# Patient Record
Sex: Male | Born: 1960 | Race: White | Hispanic: No | Smoking: Current every day smoker
Health system: Southern US, Community
[De-identification: ages and names within clinical notes are randomized; demographics above are authoritative.]

## PROBLEM LIST (undated history)

## (undated) DIAGNOSIS — F4310 Post-traumatic stress disorder, unspecified: Secondary | ICD-10-CM

## (undated) DIAGNOSIS — T7840XA Allergy, unspecified, initial encounter: Secondary | ICD-10-CM

## (undated) DIAGNOSIS — K219 Gastro-esophageal reflux disease without esophagitis: Secondary | ICD-10-CM

## (undated) DIAGNOSIS — E7800 Pure hypercholesterolemia, unspecified: Secondary | ICD-10-CM

## (undated) DIAGNOSIS — G709 Myoneural disorder, unspecified: Secondary | ICD-10-CM

## (undated) DIAGNOSIS — F32A Depression, unspecified: Secondary | ICD-10-CM

## (undated) DIAGNOSIS — M1990 Unspecified osteoarthritis, unspecified site: Secondary | ICD-10-CM

## (undated) DIAGNOSIS — F319 Bipolar disorder, unspecified: Secondary | ICD-10-CM

## (undated) DIAGNOSIS — K635 Polyp of colon: Secondary | ICD-10-CM

## (undated) DIAGNOSIS — K649 Unspecified hemorrhoids: Secondary | ICD-10-CM

## (undated) DIAGNOSIS — G4730 Sleep apnea, unspecified: Secondary | ICD-10-CM

## (undated) DIAGNOSIS — J449 Chronic obstructive pulmonary disease, unspecified: Secondary | ICD-10-CM

## (undated) DIAGNOSIS — E119 Type 2 diabetes mellitus without complications: Secondary | ICD-10-CM

## (undated) DIAGNOSIS — E782 Mixed hyperlipidemia: Secondary | ICD-10-CM

## (undated) HISTORY — DX: Depression, unspecified: F32.A

## (undated) HISTORY — DX: Chronic obstructive pulmonary disease, unspecified: J44.9

## (undated) HISTORY — PX: CATARACT EXTRACTION W/ INTRAOCULAR LENS IMPLANT: SHX1309

## (undated) HISTORY — DX: Sleep apnea, unspecified: G47.30

## (undated) HISTORY — DX: Allergy, unspecified, initial encounter: T78.40XA

## (undated) HISTORY — DX: Unspecified osteoarthritis, unspecified site: M19.90

## (undated) HISTORY — DX: Myoneural disorder, unspecified: G70.9

## (undated) HISTORY — PX: WRIST SURGERY: SHX841

---

## 2014-01-20 DIAGNOSIS — F4310 Post-traumatic stress disorder, unspecified: Secondary | ICD-10-CM | POA: Insufficient documentation

## 2014-06-15 DIAGNOSIS — J9809 Other diseases of bronchus, not elsewhere classified: Secondary | ICD-10-CM | POA: Insufficient documentation

## 2014-06-15 DIAGNOSIS — H25011 Cortical age-related cataract, right eye: Secondary | ICD-10-CM | POA: Insufficient documentation

## 2014-06-29 DIAGNOSIS — Z720 Tobacco use: Secondary | ICD-10-CM | POA: Insufficient documentation

## 2014-07-20 DIAGNOSIS — F3132 Bipolar disorder, current episode depressed, moderate: Secondary | ICD-10-CM | POA: Insufficient documentation

## 2015-03-25 DIAGNOSIS — M25831 Other specified joint disorders, right wrist: Secondary | ICD-10-CM | POA: Insufficient documentation

## 2015-03-25 DIAGNOSIS — S62101A Fracture of unspecified carpal bone, right wrist, initial encounter for closed fracture: Secondary | ICD-10-CM

## 2015-03-25 DIAGNOSIS — S62009A Unspecified fracture of navicular [scaphoid] bone of unspecified wrist, initial encounter for closed fracture: Secondary | ICD-10-CM | POA: Insufficient documentation

## 2015-03-25 HISTORY — DX: Fracture of unspecified carpal bone, right wrist, initial encounter for closed fracture: S62.101A

## 2015-04-11 HISTORY — PX: WRIST FRACTURE SURGERY: SHX121

## 2015-10-23 DIAGNOSIS — H2511 Age-related nuclear cataract, right eye: Secondary | ICD-10-CM | POA: Insufficient documentation

## 2015-10-24 DIAGNOSIS — Z961 Presence of intraocular lens: Secondary | ICD-10-CM | POA: Insufficient documentation

## 2015-10-24 DIAGNOSIS — H179 Unspecified corneal scar and opacity: Secondary | ICD-10-CM | POA: Insufficient documentation

## 2015-12-18 DIAGNOSIS — G250 Essential tremor: Secondary | ICD-10-CM | POA: Insufficient documentation

## 2016-04-29 DIAGNOSIS — M1811 Unilateral primary osteoarthritis of first carpometacarpal joint, right hand: Secondary | ICD-10-CM | POA: Insufficient documentation

## 2016-06-26 DIAGNOSIS — E669 Obesity, unspecified: Secondary | ICD-10-CM | POA: Insufficient documentation

## 2016-12-11 DIAGNOSIS — G5621 Lesion of ulnar nerve, right upper limb: Secondary | ICD-10-CM | POA: Insufficient documentation

## 2017-01-18 DIAGNOSIS — E119 Type 2 diabetes mellitus without complications: Secondary | ICD-10-CM | POA: Insufficient documentation

## 2017-01-18 DIAGNOSIS — E782 Mixed hyperlipidemia: Secondary | ICD-10-CM | POA: Insufficient documentation

## 2019-08-07 DIAGNOSIS — Z9049 Acquired absence of other specified parts of digestive tract: Secondary | ICD-10-CM | POA: Insufficient documentation

## 2019-08-24 ENCOUNTER — Other Ambulatory Visit: Payer: Self-pay

## 2019-08-24 ENCOUNTER — Emergency Department: Payer: Medicaid Other | Admitting: Anesthesiology

## 2019-08-24 ENCOUNTER — Encounter
Admission: EM | Disposition: A | Discharge status: Home / Self Care | Payer: Self-pay | Source: Home / Self Care | Attending: Emergency Medicine

## 2019-08-24 ENCOUNTER — Emergency Department: Payer: Medicaid Other

## 2019-08-24 ENCOUNTER — Encounter: Payer: Self-pay | Admitting: Emergency Medicine

## 2019-08-24 ENCOUNTER — Observation Stay
Admission: EM | Admit: 2019-08-24 | Discharge: 2019-08-25 | Disposition: A | Discharge status: Home / Self Care | Payer: Medicaid Other | Attending: Surgery | Admitting: Surgery

## 2019-08-24 DIAGNOSIS — K3580 Unspecified acute appendicitis: Secondary | ICD-10-CM | POA: Diagnosis present

## 2019-08-24 DIAGNOSIS — Z7982 Long term (current) use of aspirin: Secondary | ICD-10-CM | POA: Insufficient documentation

## 2019-08-24 DIAGNOSIS — R1030 Lower abdominal pain, unspecified: Secondary | ICD-10-CM | POA: Diagnosis present

## 2019-08-24 DIAGNOSIS — R1031 Right lower quadrant pain: Secondary | ICD-10-CM

## 2019-08-24 DIAGNOSIS — F17210 Nicotine dependence, cigarettes, uncomplicated: Secondary | ICD-10-CM | POA: Insufficient documentation

## 2019-08-24 DIAGNOSIS — Z20822 Contact with and (suspected) exposure to covid-19: Secondary | ICD-10-CM | POA: Insufficient documentation

## 2019-08-24 DIAGNOSIS — R110 Nausea: Secondary | ICD-10-CM

## 2019-08-24 DIAGNOSIS — K3532 Acute appendicitis with perforation and localized peritonitis, without abscess: Secondary | ICD-10-CM | POA: Diagnosis not present

## 2019-08-24 HISTORY — PX: XI ROBOTIC LAPAROSCOPIC ASSISTED APPENDECTOMY: SHX6877

## 2019-08-24 HISTORY — DX: Post-traumatic stress disorder, unspecified: F43.10

## 2019-08-24 HISTORY — DX: Unspecified acute appendicitis: K35.80

## 2019-08-24 LAB — COMPREHENSIVE METABOLIC PANEL
ALT: 26 U/L (ref 0–44)
AST: 20 U/L (ref 15–41)
Albumin: 4.4 g/dL (ref 3.5–5.0)
Alkaline Phosphatase: 76 U/L (ref 38–126)
Anion gap: 9 (ref 5–15)
BUN: 8 mg/dL (ref 8–23)
CO2: 26 mmol/L (ref 22–32)
Calcium: 9.2 mg/dL (ref 8.9–10.3)
Chloride: 103 mmol/L (ref 98–111)
Creatinine, Ser: 0.79 mg/dL (ref 0.61–1.24)
GFR calc Af Amer: >60 mL/min (ref >60)
GFR calc non Af Amer: >60 mL/min (ref >60)
Glucose, Bld: 151 mg/dL — ABNORMAL HIGH (ref 70–99)
Potassium: 4 mmol/L (ref 3.5–5.1)
Sodium: 138 mmol/L (ref 135–145)
Total Bilirubin: 1 mg/dL (ref 0.3–1.2)
Total Protein: 7.6 g/dL (ref 6.5–8.1)

## 2019-08-24 LAB — SARS CORONAVIRUS 2 BY RT PCR (HOSPITAL ORDER, PERFORMED IN ~~LOC~~ HOSPITAL LAB): SARS Coronavirus 2: NEGATIVE

## 2019-08-24 LAB — URINALYSIS, COMPLETE (UACMP) WITH MICROSCOPIC
Bacteria, UA: NONE SEEN
Bilirubin Urine: NEGATIVE
Glucose, UA: NEGATIVE mg/dL
Hgb urine dipstick: NEGATIVE
Ketones, ur: 5 mg/dL — AB
Leukocytes,Ua: NEGATIVE
Nitrite: NEGATIVE
Protein, ur: NEGATIVE mg/dL
Specific Gravity, Urine: 1.029 (ref 1.005–1.030)
Squamous Epithelial / HPF: NONE SEEN (ref 0–5)
pH: 6 (ref 5.0–8.0)

## 2019-08-24 LAB — LIPASE, BLOOD: Lipase: 22 U/L (ref 11–51)

## 2019-08-24 LAB — CBC
HCT: 46.6 % (ref 39.0–52.0)
Hemoglobin: 15.8 g/dL (ref 13.0–17.0)
MCH: 30.4 pg (ref 26.0–34.0)
MCHC: 33.9 g/dL (ref 30.0–36.0)
MCV: 89.8 fL (ref 80.0–100.0)
Platelets: 336 10*3/uL (ref 150–400)
RBC: 5.19 MIL/uL (ref 4.22–5.81)
RDW: 13.9 % (ref 11.5–15.5)
WBC: 11.6 10*3/uL — ABNORMAL HIGH (ref 4.0–10.5)
nRBC: 0 % (ref 0.0–0.2)

## 2019-08-24 SURGERY — APPENDECTOMY, ROBOT-ASSISTED, LAPAROSCOPIC
Anesthesia: General

## 2019-08-24 MED ORDER — MIDAZOLAM HCL 2 MG/2ML IJ SOLN
INTRAMUSCULAR | Status: DC | PRN
  Administered 2019-08-24: 2 mg via INTRAVENOUS

## 2019-08-24 MED ORDER — PROPOFOL 10 MG/ML IV BOLUS
INTRAVENOUS | Status: AC
  Filled 2019-08-24: qty 20

## 2019-08-24 MED ORDER — BUSPIRONE HCL 10 MG PO TABS
20.0000 mg | ORAL_TABLET | Freq: Two times a day (BID) | ORAL | Status: DC
  Administered 2019-08-24 – 2019-08-25 (×2): 20 mg via ORAL
  Filled 2019-08-24 (×3): qty 2

## 2019-08-24 MED ORDER — ONDANSETRON HCL 4 MG/2ML IJ SOLN: 4.0000 mg | Freq: Once | INTRAMUSCULAR | Status: DC | PRN

## 2019-08-24 MED ORDER — FENTANYL CITRATE (PF) 100 MCG/2ML IJ SOLN
INTRAMUSCULAR | Status: DC | PRN
  Administered 2019-08-24 (×4): 50 ug via INTRAVENOUS

## 2019-08-24 MED ORDER — OXYCODONE HCL 5 MG PO TABS
5.0000 mg | ORAL_TABLET | Freq: Once | ORAL | Status: DC | PRN
Start: 2019-08-24 — End: 2019-08-24

## 2019-08-24 MED ORDER — SUCCINYLCHOLINE CHLORIDE 200 MG/10ML IV SOSY
PREFILLED_SYRINGE | INTRAVENOUS | Status: AC
  Filled 2019-08-24: qty 10

## 2019-08-24 MED ORDER — ROSUVASTATIN CALCIUM 10 MG PO TABS
10.0000 mg | ORAL_TABLET | Freq: Every day | ORAL | Status: DC
  Administered 2019-08-25: 10 mg via ORAL
  Filled 2019-08-24: qty 1

## 2019-08-24 MED ORDER — ZOLPIDEM TARTRATE 5 MG PO TABS
10.0000 mg | ORAL_TABLET | Freq: Every day | ORAL | Status: DC
  Administered 2019-08-24: 10 mg via ORAL
  Filled 2019-08-24: qty 2

## 2019-08-24 MED ORDER — FENTANYL CITRATE (PF) 100 MCG/2ML IJ SOLN
50.0000 ug | Freq: Once | INTRAMUSCULAR | Status: AC
  Administered 2019-08-24: 50 ug via INTRAVENOUS
  Filled 2019-08-24: qty 2

## 2019-08-24 MED ORDER — METRONIDAZOLE IN NACL 5-0.79 MG/ML-% IV SOLN
500.0000 mg | Freq: Once | INTRAVENOUS | Status: AC
  Administered 2019-08-24: 500 mg via INTRAVENOUS
  Filled 2019-08-24: qty 100

## 2019-08-24 MED ORDER — FENTANYL CITRATE (PF) 100 MCG/2ML IJ SOLN
25.0000 ug | INTRAMUSCULAR | Status: DC | PRN
Start: 2019-08-24 — End: 2019-08-24

## 2019-08-24 MED ORDER — DEXMEDETOMIDINE HCL 200 MCG/2ML IV SOLN
INTRAVENOUS | Status: DC | PRN
  Administered 2019-08-24 (×2): 8 ug via INTRAVENOUS

## 2019-08-24 MED ORDER — PROPOFOL 10 MG/ML IV BOLUS
INTRAVENOUS | Status: DC | PRN
  Administered 2019-08-24: 160 mg via INTRAVENOUS

## 2019-08-24 MED ORDER — ASPIRIN EC 81 MG PO TBEC
81.0000 mg | DELAYED_RELEASE_TABLET | Freq: Every day | ORAL | Status: DC
  Administered 2019-08-25: 81 mg via ORAL
  Filled 2019-08-24: qty 1

## 2019-08-24 MED ORDER — ALPRAZOLAM 0.5 MG PO TABS
1.0000 mg | ORAL_TABLET | Freq: Three times a day (TID) | ORAL | Status: DC
  Administered 2019-08-24 – 2019-08-25 (×2): 1 mg via ORAL
  Filled 2019-08-24 (×2): qty 2

## 2019-08-24 MED ORDER — FENTANYL CITRATE (PF) 100 MCG/2ML IJ SOLN
INTRAMUSCULAR | Status: AC
  Filled 2019-08-24: qty 2

## 2019-08-24 MED ORDER — ACETAMINOPHEN 10 MG/ML IV SOLN
INTRAVENOUS | Status: AC
  Filled 2019-08-24: qty 100

## 2019-08-24 MED ORDER — CIPROFLOXACIN IN D5W 400 MG/200ML IV SOLN
400.0000 mg | Freq: Two times a day (BID) | INTRAVENOUS | Status: AC
  Administered 2019-08-24: 400 mg via INTRAVENOUS
  Filled 2019-08-24: qty 200

## 2019-08-24 MED ORDER — DEXAMETHASONE SODIUM PHOSPHATE 10 MG/ML IJ SOLN
INTRAMUSCULAR | Status: AC
  Filled 2019-08-24: qty 1

## 2019-08-24 MED ORDER — SUGAMMADEX SODIUM 200 MG/2ML IV SOLN
INTRAVENOUS | Status: DC | PRN
  Administered 2019-08-24: 200 mg via INTRAVENOUS

## 2019-08-24 MED ORDER — VILAZODONE HCL 40 MG PO TABS
40.0000 mg | ORAL_TABLET | Freq: Every day | ORAL | Status: DC
  Administered 2019-08-25: 40 mg via ORAL
  Filled 2019-08-24: qty 1

## 2019-08-24 MED ORDER — ONDANSETRON HCL 4 MG/2ML IJ SOLN
4.0000 mg | Freq: Once | INTRAMUSCULAR | Status: AC
  Administered 2019-08-24: 4 mg via INTRAVENOUS
  Filled 2019-08-24: qty 2

## 2019-08-24 MED ORDER — ONDANSETRON HCL 4 MG/2ML IJ SOLN: 4.0000 mg | Freq: Four times a day (QID) | INTRAMUSCULAR | Status: DC | PRN

## 2019-08-24 MED ORDER — SODIUM CHLORIDE 0.9 % IV SOLN: INTRAVENOUS | Status: DC

## 2019-08-24 MED ORDER — DEXAMETHASONE SODIUM PHOSPHATE 10 MG/ML IJ SOLN
INTRAMUSCULAR | Status: DC | PRN
  Administered 2019-08-24: 10 mg via INTRAVENOUS

## 2019-08-24 MED ORDER — LIDOCAINE HCL (CARDIAC) PF 100 MG/5ML IV SOSY
PREFILLED_SYRINGE | INTRAVENOUS | Status: DC | PRN
  Administered 2019-08-24: 100 mg via INTRAVENOUS

## 2019-08-24 MED ORDER — MORPHINE SULFATE (PF) 2 MG/ML IV SOLN: 2.0000 mg | INTRAVENOUS | Status: DC | PRN

## 2019-08-24 MED ORDER — LACTATED RINGERS IV SOLN: INTRAVENOUS | Status: DC | PRN

## 2019-08-24 MED ORDER — ROCURONIUM BROMIDE 100 MG/10ML IV SOLN
INTRAVENOUS | Status: DC | PRN
  Administered 2019-08-24: 50 mg via INTRAVENOUS
  Administered 2019-08-24: 10 mg via INTRAVENOUS
  Administered 2019-08-24: 5 mg via INTRAVENOUS

## 2019-08-24 MED ORDER — OXYCODONE HCL 5 MG/5ML PO SOLN: 5.0000 mg | Freq: Once | ORAL | Status: DC | PRN

## 2019-08-24 MED ORDER — SUCCINYLCHOLINE CHLORIDE 20 MG/ML IJ SOLN
INTRAMUSCULAR | Status: DC | PRN
  Administered 2019-08-24: 100 mg via INTRAVENOUS

## 2019-08-24 MED ORDER — MIDAZOLAM HCL 2 MG/2ML IJ SOLN
INTRAMUSCULAR | Status: AC
  Filled 2019-08-24: qty 2

## 2019-08-24 MED ORDER — B COMPLEX-C PO TABS
1.0000 | ORAL_TABLET | Freq: Every day | ORAL | Status: DC
  Administered 2019-08-25: 1 via ORAL
  Filled 2019-08-24 (×2): qty 1

## 2019-08-24 MED ORDER — LIDOCAINE HCL (PF) 2 % IJ SOLN
INTRAMUSCULAR | Status: AC
  Filled 2019-08-24: qty 5

## 2019-08-24 MED ORDER — ACETAMINOPHEN 500 MG PO TABS
1000.0000 mg | ORAL_TABLET | Freq: Four times a day (QID) | ORAL | Status: DC
  Administered 2019-08-24 – 2019-08-25 (×3): 1000 mg via ORAL
  Filled 2019-08-24 (×3): qty 2

## 2019-08-24 MED ORDER — ONDANSETRON HCL 4 MG/2ML IJ SOLN
INTRAMUSCULAR | Status: DC | PRN
  Administered 2019-08-24: 4 mg via INTRAVENOUS

## 2019-08-24 MED ORDER — KETOROLAC TROMETHAMINE 15 MG/ML IJ SOLN
15.0000 mg | Freq: Four times a day (QID) | INTRAMUSCULAR | Status: AC
  Administered 2019-08-24: 15 mg via INTRAVENOUS
  Filled 2019-08-24: qty 1

## 2019-08-24 MED ORDER — ROCURONIUM BROMIDE 10 MG/ML (PF) SYRINGE
PREFILLED_SYRINGE | INTRAVENOUS | Status: AC
  Filled 2019-08-24: qty 10

## 2019-08-24 MED ORDER — SODIUM CHLORIDE 0.9% FLUSH
3.0000 mL | Freq: Once | INTRAVENOUS | Status: AC
  Administered 2019-08-24: 3 mL via INTRAVENOUS

## 2019-08-24 MED ORDER — ACETAMINOPHEN 10 MG/ML IV SOLN
INTRAVENOUS | Status: DC | PRN
  Administered 2019-08-24: 1000 mg via INTRAVENOUS

## 2019-08-24 MED ORDER — IOHEXOL 300 MG/ML  SOLN
100.0000 mL | Freq: Once | INTRAMUSCULAR | Status: AC | PRN
  Administered 2019-08-24: 100 mL via INTRAVENOUS

## 2019-08-24 MED ORDER — ONDANSETRON HCL 4 MG/2ML IJ SOLN
INTRAMUSCULAR | Status: AC
  Filled 2019-08-24: qty 2

## 2019-08-24 MED ORDER — BUPIVACAINE LIPOSOME 1.3 % IJ SUSP
INTRAMUSCULAR | Status: DC | PRN
  Administered 2019-08-24: 20 mL

## 2019-08-24 MED ORDER — LACTATED RINGERS IV BOLUS
1000.0000 mL | Freq: Once | INTRAVENOUS | Status: AC
  Administered 2019-08-24: 1000 mL via INTRAVENOUS

## 2019-08-24 MED ORDER — ONDANSETRON 4 MG PO TBDP: 4.0000 mg | ORAL_TABLET | Freq: Four times a day (QID) | ORAL | Status: DC | PRN

## 2019-08-24 MED ORDER — ALBUTEROL SULFATE (2.5 MG/3ML) 0.083% IN NEBU: 2.5000 mg | INHALATION_SOLUTION | Freq: Four times a day (QID) | RESPIRATORY_TRACT | Status: DC | PRN

## 2019-08-24 MED ORDER — KETOROLAC TROMETHAMINE 15 MG/ML IJ SOLN: 15.0000 mg | Freq: Four times a day (QID) | INTRAMUSCULAR | Status: DC | PRN

## 2019-08-24 MED ORDER — SODIUM CHLORIDE 0.9 % IV SOLN
2.0000 g | Freq: Once | INTRAVENOUS | Status: AC
  Administered 2019-08-24: 2 g via INTRAVENOUS
  Filled 2019-08-24: qty 20

## 2019-08-24 MED ORDER — METRONIDAZOLE IN NACL 5-0.79 MG/ML-% IV SOLN
500.0000 mg | Freq: Three times a day (TID) | INTRAVENOUS | Status: AC
  Administered 2019-08-25: 500 mg via INTRAVENOUS
  Filled 2019-08-24: qty 100

## 2019-08-24 MED ORDER — HYDROCODONE-ACETAMINOPHEN 5-325 MG PO TABS: 1.0000 | ORAL_TABLET | ORAL | Status: DC | PRN

## 2019-08-24 MED ORDER — QUETIAPINE FUMARATE 25 MG PO TABS: 25.0000 mg | ORAL_TABLET | Freq: Every evening | ORAL | Status: DC

## 2019-08-24 MED ORDER — PALIPERIDONE ER 3 MG PO TB24
6.0000 mg | ORAL_TABLET | Freq: Every morning | ORAL | Status: DC
  Filled 2019-08-24: qty 1

## 2019-08-24 SURGICAL SUPPLY — 56 items
BLADE CLIPPER SURG (BLADE) ×2 IMPLANT
CANISTER SUCT 1200ML W/VALVE (MISCELLANEOUS) IMPLANT
CANISTER SUCT 3000ML PPV (MISCELLANEOUS) ×2 IMPLANT
CHLORAPREP W/TINT 26 (MISCELLANEOUS) ×2 IMPLANT
CLIP VESOLOCK MED LG 6/CT (CLIP) IMPLANT
COVER TIP SHEARS 8 DVNC (MISCELLANEOUS) ×1 IMPLANT
COVER TIP SHEARS 8MM DA VINCI (MISCELLANEOUS) ×1
COVER WAND RF STERILE (DRAPES) ×2 IMPLANT
DECANTER SPIKE VIAL GLASS SM (MISCELLANEOUS) IMPLANT
DEFOGGER SCOPE WARMER CLEARIFY (MISCELLANEOUS) ×2 IMPLANT
DERMABOND ADVANCED (GAUZE/BANDAGES/DRESSINGS) ×1
DERMABOND ADVANCED .7 DNX12 (GAUZE/BANDAGES/DRESSINGS) ×1 IMPLANT
DRAPE ARM DVNC X/XI (DISPOSABLE) ×3 IMPLANT
DRAPE COLUMN DVNC XI (DISPOSABLE) ×1 IMPLANT
DRAPE DA VINCI XI ARM (DISPOSABLE) ×3
DRAPE DA VINCI XI COLUMN (DISPOSABLE) ×1
GLOVE ORTHO TXT STRL SZ7.5 (GLOVE) ×6 IMPLANT
GOWN STRL REUS W/ TWL LRG LVL3 (GOWN DISPOSABLE) ×4 IMPLANT
GOWN STRL REUS W/TWL LRG LVL3 (GOWN DISPOSABLE) ×4
GRASPER SUT TROCAR 14GX15 (MISCELLANEOUS) IMPLANT
INFUSOR MANOMETER BAG 3000ML (MISCELLANEOUS) IMPLANT
IRRIGATION STRYKERFLOW (MISCELLANEOUS) ×1 IMPLANT
IRRIGATOR STRYKERFLOW (MISCELLANEOUS) ×2
IRRIGATOR SUCT 8 DISP DVNC XI (IRRIGATION / IRRIGATOR) IMPLANT
IRRIGATOR SUCTION 8MM XI DISP (IRRIGATION / IRRIGATOR)
IV NS IRRIG 3000ML ARTHROMATIC (IV SOLUTION) ×2 IMPLANT
KIT PINK PAD W/HEAD ARE REST (MISCELLANEOUS) ×2
KIT PINK PAD W/HEAD ARM REST (MISCELLANEOUS) ×1 IMPLANT
KIT TURNOVER KIT A (KITS) ×2 IMPLANT
LABEL OR SOLS (LABEL) ×2 IMPLANT
NEEDLE HYPO 22GX1.5 SAFETY (NEEDLE) ×2 IMPLANT
NEEDLE INSUFFLATION 14GA 120MM (NEEDLE) IMPLANT
NS IRRIG 500ML POUR BTL (IV SOLUTION) ×2 IMPLANT
PACK LAP CHOLECYSTECTOMY (MISCELLANEOUS) ×2 IMPLANT
POUCH SPECIMEN RETRIEVAL 10MM (ENDOMECHANICALS) ×2 IMPLANT
RELOAD STAPLER 3.5X45 BLU DVNC (STAPLE) IMPLANT
RELOAD STAPLER 3.5X60 BLU DVNC (STAPLE) IMPLANT
SEAL CANN UNIV 5-8 DVNC XI (MISCELLANEOUS) ×3 IMPLANT
SEAL XI 5MM-8MM UNIVERSAL (MISCELLANEOUS) ×3
SET TUBE SMOKE EVAC HIGH FLOW (TUBING) ×2 IMPLANT
SOLUTION ELECTROLUBE (MISCELLANEOUS) ×2 IMPLANT
STAPLER 45 DA VINCI SURE FORM (STAPLE)
STAPLER 45 SUREFORM DVNC (STAPLE) IMPLANT
STAPLER 60 DA VINCI SURE FORM (STAPLE)
STAPLER 60 SUREFORM DVNC (STAPLE) IMPLANT
STAPLER RELOAD 3.5X45 BLU DVNC (STAPLE)
STAPLER RELOAD 3.5X45 BLUE (STAPLE)
STAPLER RELOAD 3.5X60 BLU DVNC (STAPLE)
STAPLER RELOAD 3.5X60 BLUE (STAPLE)
SUT MNCRL 4-0 (SUTURE) ×1
SUT MNCRL 4-0 27XMFL (SUTURE) ×1
SUT SILK 0 SH 30 (SUTURE) ×4 IMPLANT
SUT VICRYL 0 AB UR-6 (SUTURE) ×4 IMPLANT
SUTURE MNCRL 4-0 27XMF (SUTURE) ×1 IMPLANT
TRAY FOLEY SLVR 16FR LF STAT (SET/KITS/TRAYS/PACK) IMPLANT
TROCAR Z-THREAD FIOS 11X100 BL (TROCAR) ×2 IMPLANT

## 2019-08-24 NOTE — Anesthesia Preprocedure Evaluation (Signed)
Anesthesia Evaluation  Patient identified by MRN, date of birth, ID band Patient awake    Reviewed: Allergy & Precautions, H&P , NPO status , Patient's Chart, lab work & pertinent test results  Airway Mallampati: III  TM Distance: >3 FB Neck ROM: full    Dental  (+) Chipped, Poor Dentition, Missing   Pulmonary sleep apnea and Continuous Positive Airway Pressure Ventilation , COPD, Current Smoker and Patient abstained from smoking.,     + decreased breath sounds      Cardiovascular (-) angina(-) Past MI and (-) Cardiac Stents negative cardio ROS Normal cardiovascular exam(-) dysrhythmias  Rhythm:regular Rate:Normal     Neuro/Psych PSYCHIATRIC DISORDERS Anxiety negative neurological ROS     GI/Hepatic negative GI ROS, Neg liver ROS,   Endo/Other  negative endocrine ROS  Renal/GU      Musculoskeletal   Abdominal   Peds  Hematology negative hematology ROS (+)   Anesthesia Other Findings Clubbing of fingers   Past Medical History: No date: PTSD (post-traumatic stress disorder)  BMI    Body Mass Index: 26.50 kg/m      Reproductive/Obstetrics negative OB ROS                             Anesthesia Physical Anesthesia Plan  ASA: III  Anesthesia Plan: General ETT   Post-op Pain Management:    Induction:   PONV Risk Score and Plan: Ondansetron, Dexamethasone, Midazolam and Treatment may vary due to age or medical condition  Airway Management Planned:   Additional Equipment:   Intra-op Plan:   Post-operative Plan:   Informed Consent: I have reviewed the patients History and Physical, chart, labs and discussed the procedure including the risks, benefits and alternatives for the proposed anesthesia with the patient or authorized representative who has indicated his/her understanding and acceptance.     Dental Advisory Given  Plan Discussed with: Anesthesiologist, CRNA and  Surgeon  Anesthesia Plan Comments:         Anesthesia Quick Evaluation

## 2019-08-24 NOTE — ED Notes (Signed)
Patient reports relief from "constant nausea" as well as improvement in pain.

## 2019-08-24 NOTE — H&P (Signed)
Patient ID: Victor Alvarez, male   DOB: [birth year], 59 y.o.   MRN: [id number]  Chief Complaint: Lower abdominal pain  History of Present Illness Victor Alvarez is a 59 y.o. male with a 12-hour history of lower abdominal pain with associated nausea and crampy abdominal distention.  Was awakened from sleep, persistent nausea without vomiting.  Pain was initially somewhat diffuse, now more localized to the right lower quadrant.  Had small bowel movement this morning along with flatus, but no improvement in symptoms.  Denies any history of hematuria, dysuria or urinary urgency.  No prior history of abdominal surgery.   Past Medical History Past Medical History:  Diagnosis Date  . PTSD (post-traumatic stress disorder)         No Known Allergies  Current Facility-Administered Medications  Medication Dose Route Frequency Provider Last Rate Last Admin  . cefTRIAXone (ROCEPHIN) 2 g in sodium chloride 0.9 % 100 mL IVPB  2 g Intravenous Once Delton Prairie, MD      . metroNIDAZOLE (FLAGYL) IVPB 500 mg  500 mg Intravenous Once Delton Prairie, MD       Current Outpatient Medications  Medication Sig Dispense Refill  . aspirin 81 MG EC tablet Take 81 mg by mouth daily.    Marland Kitchen ALPRAZolam (XANAX) 1 MG tablet Take 1 mg by mouth 3 (three) times daily.    . busPIRone (BUSPAR) 10 MG tablet Take 20 mg by mouth 2 (two) times daily.    . paliperidone (INVEGA) 6 MG 24 hr tablet Take 6 mg by mouth every morning.    Marland Kitchen PROAIR HFA 108 (90 Base) MCG/ACT inhaler Inhale 2 puffs into the lungs every 6 (six) hours as needed for wheezing or shortness of breath.    . QUEtiapine (SEROQUEL) 25 MG tablet Take 25-50 mg by mouth every evening.    . rosuvastatin (CRESTOR) 10 MG tablet Take 10 mg by mouth daily.    Marland Kitchen VIIBRYD 40 MG TABS Take 40 mg by mouth daily.    Marland Kitchen zolpidem (AMBIEN) 10 MG tablet Take 10 mg by mouth at bedtime.      Family History No family history on file.    Social History Social History   Tobacco Use  .  Smoking status: Current Every Day Smoker    Types: Cigarettes  . Smokeless tobacco: Never Used  Substance Use Topics  . Alcohol use: Not Currently  . Drug use: Not Currently        ROS Constitutional: No fever/chills Eyes: No visual changes. ENT: No sore throat. Cardiovascular: Denies chest pain. Respiratory: Denies shortness of breath. Gastrointestinal:  no vomiting.  No diarrhea.  No constipation. Positive for abdominal pain and nausea Genitourinary: Negative for dysuria. Musculoskeletal: Negative for back pain. Skin: Negative for rash. Neurological: Negative for headaches, focal weakness or numbness.   Physical Exam Blood pressure (!) 150/76, pulse 82, temperature 98.2 F (36.8 C), temperature source Oral, resp. rate 20, height 5\' 11"  (1.803 m), weight 86.2 kg, SpO2 100 %. Last Weight  Most recent update: 08/24/2019 10:07 AM   Weight  86.2 kg (190 lb)            CONSTITUTIONAL: Well developed, and nourished, appropriately responsive and aware without distress.   EYES: Sclera non-icteric.   EARS, NOSE, MOUTH AND THROAT: Mask worn.  Hearing is intact to voice.  NECK: Trachea is midline, and there is no jugular venous distension.  LYMPH NODES:  Lymph nodes in the neck are not enlarged. RESPIRATORY:  Lungs are  clear, and breath sounds are equal bilaterally. Normal respiratory effort without pathologic use of accessory muscles. CARDIOVASCULAR: Heart is regular in rate and rhythm. GI: The abdomen is soft, tenderness localized to the right lower quadrant, and nondistended. There were no palpable masses. I did not appreciate hepatosplenomegaly. There were normal bowel sounds. MUSCULOSKELETAL:  Symmetrical muscle tone appreciated in all four extremities.    SKIN: Skin turgor is normal. No pathologic skin lesions appreciated.  NEUROLOGIC:  Motor and sensation appear grossly normal.  Cranial nerves are grossly without defect. PSYCH:  Alert and oriented to person, place and time.  Affect is appropriate for situation.  Data Reviewed I have personally reviewed what is currently available of the patient's imaging, recent labs and medical records.   Labs:  CBC Latest Ref Rng & Units 08/24/2019  WBC 4.0 - 10.5 K/uL 11.6(H)  Hemoglobin 13.0 - 17.0 g/dL 24.5  Hematocrit 39 - 52 % 46.6  Platelets 150 - 400 K/uL 336   CMP Latest Ref Rng & Units 08/24/2019  Glucose 70 - 99 mg/dL 809(X)  BUN 8 - 23 mg/dL 8  Creatinine 8.33 - 8.25 mg/dL 0.53  Sodium 976 - 734 mmol/L 138  Potassium 3.5 - 5.1 mmol/L 4.0  Chloride 98 - 111 mmol/L 103  CO2 22 - 32 mmol/L 26  Calcium 8.9 - 10.3 mg/dL 9.2  Total Protein 6.5 - 8.1 g/dL 7.6  Total Bilirubin 0.3 - 1.2 mg/dL 1.0  Alkaline Phos 38 - 126 U/L 76  AST 15 - 41 U/L 20  ALT 0 - 44 U/L 26      Imaging: Radiology review:  Within last 24 hrs: CT ABDOMEN PELVIS W CONTRAST  Result Date: 08/24/2019 CLINICAL DATA:  Mid abdominal pain with nausea and vomiting since last night EXAM: CT ABDOMEN AND PELVIS WITH CONTRAST TECHNIQUE: Multidetector CT imaging of the abdomen and pelvis was performed using the standard protocol following bolus administration of intravenous contrast. CONTRAST:  OMNIPAQUE IOHEXOL 300 MG/ML  SOLN COMPARISON:  None. FINDINGS: Lower chest: Small hiatal hernia. Hepatobiliary: No focal liver abnormality is seen. No gallstones, gallbladder wall thickening, or biliary dilatation. Pancreas: Unremarkable. No pancreatic ductal dilatation or surrounding inflammatory changes. Spleen: Normal in size without focal abnormality. Adrenals/Urinary Tract: Adrenal glands are unremarkable. Kidneys are normal, without renal calculi, focal lesion, or hydronephrosis. Bladder is unremarkable. Stomach/Bowel: Stomach is within normal limits. The appendix is dilated measuring 1.3 cm in diameter. There are portions of the wall that appear indistinct concerning for early perforation. There is no drainable collection or free air. No evidence of bowel  wall thickening, distention, or inflammatory changes. Vascular/Lymphatic: Aortic atherosclerosis. No enlarged abdominal or pelvic lymph nodes. Reproductive: Prostate is unremarkable. Other: Small bilateral fat containing inguinal hernias. No abdominopelvic ascites. Musculoskeletal: Degenerative disc disease in the lumbar spine most pronounced at L2-3. IMPRESSION: 1. Findings consistent with acute appendicitis with portions of the wall that appear indistinct concerning for early perforation. No drainable collection or free air. 2. Small hiatal hernia. 3. Aortic atherosclerosis. Aortic Atherosclerosis (ICD10-I70.0). Electronically Signed   By: Emmaline Kluver M.D.   On: 08/24/2019 15:00    Assessment    Acute appendicitis. There are no problems to display for this patient.   Plan    Robotic appendectomy.  The risks, benefits, complications, treatment options, and expected outcomes were discussed with the patient. The treatment of antibiotics alone was discussed giving a 20% chance that this could fail and surgery would be necessary.  Also discussed continuing to  the operating room for Laparoscopic Appendectomy.  The possibilities of  bleeding, recurrent infection, perforation of viscus, finding a normal appendix, the need for additional procedures, failure to diagnose a condition, conversion to open procedure and creating a complication requiring transfusion or further operations were discussed. The patient was given the opportunity to ask questions and have them answered.  Patient would like to proceed with Laparoscopic Appendectomy and consent was obtained.     Face-to-face time spent with the patient and accompanying care providers(if present) was 30 minutes, with more than 50% of the time spent counseling, educating, and coordinating care of the patient.      Campbell Lerner M.D., FACS 08/24/2019, 4:53 PM

## 2019-08-24 NOTE — ED Provider Notes (Signed)
[facility] Emergency Department Provider Note ____________________________________________   First MD Initiated Contact with Patient 08/24/19 1316     (approximate)  I have reviewed the triage vital signs and the nursing notes.  HISTORY  Chief Complaint Abdominal Pain   HPI Victor Alvarez is a 58 y.o. male who presents to the ED for evaluation of abdominal pain and nausea.   History of PTSD, but otherwise healthy.  Patient reports about 12 hours of generalized, but primarily lower abdominal pain with associated nausea and gassy sensation.  Reports the sleep woke him up from night last night at about 11 PM.  He is felt the persistent sensation of nausea and the desire to vomit, but no vomiting.  Reports passing a small amount of formed stool this morning and continues to pass gas, with no change in his symptoms.  Reports voiding this morning without dysuria or hematuria.   Denies fevers, substernal chest pain, cough, syncope.   Has never had an abdominal surgery before  Past Medical History:  Diagnosis Date  . PTSD (post-traumatic stress disorder)     Patient Active Problem List   Diagnosis Date Noted  . Acute appendicitis 08/24/2019      Prior to Admission medications   Medication Sig Start Date End Date Taking? Authorizing Provider  ALPRAZolam Prudy Feeler) 1 MG tablet Take 1 mg by mouth 3 (three) times daily. 06/29/19  Yes [provider]  aspirin 81 MG EC tablet Take 81 mg by mouth daily. 02/24/17  Yes [provider]  B Complex-C (B-COMPLEX WITH VITAMIN C) tablet Take 1 tablet by mouth daily.   Yes [provider]  busPIRone (BUSPAR) 10 MG tablet Take 20 mg by mouth 2 (two) times daily. 08/13/19  Yes [provider]  paliperidone (INVEGA) 6 MG 24 hr tablet Take 6 mg by mouth every morning. 07/26/19  Yes [provider]  QUEtiapine (SEROQUEL) 25 MG tablet Take 25-50 mg by mouth every evening. 07/26/19  Yes  [provider]  rosuvastatin (CRESTOR) 10 MG tablet Take 10 mg by mouth daily. 07/09/19  Yes [provider]  VIIBRYD 40 MG TABS Take 40 mg by mouth daily. 05/23/19  Yes [provider]  zolpidem (AMBIEN) 10 MG tablet Take 10 mg by mouth at bedtime. 08/07/19  Yes [provider]  PROAIR HFA 108 (90 Base) MCG/ACT inhaler Inhale 2 puffs into the lungs every 6 (six) hours as needed for wheezing or shortness of breath. 07/31/19   [provider]    Allergies Codeine  No family history on file.  Social History Social History   Tobacco Use  . Smoking status: Current Every Day Smoker    Types: Cigarettes  . Smokeless tobacco: Never Used  Substance Use Topics  . Alcohol use: Not Currently  . Drug use: Not Currently    Review of Systems  Constitutional: No fever/chills Eyes: No visual changes. ENT: No sore throat. Cardiovascular: Denies chest pain. Respiratory: Denies shortness of breath. Gastrointestinal:  no vomiting.  No diarrhea.  No constipation. Positive for abdominal pain and nausea Genitourinary: Negative for dysuria. Musculoskeletal: Negative for back pain. Skin: Negative for rash. Neurological: Negative for headaches, focal weakness or numbness.   ____________________________________________   PHYSICAL EXAM:  VITAL SIGNS: ED Triage Vitals  Enc Vitals Group     BP 08/24/19 1019 159/84     Pulse Rate 08/24/19 1019 65     Resp 08/24/19 1019 20     Temp 08/24/19 1019  98.2 F (36.8 C)     Temp Source 08/24/19 1019 Oral     SpO2 08/24/19 1019 93 %     Weight 08/24/19 1007 190 lb (86.2 kg)     Height 08/24/19 1007 5\' 11"  (1.803 m)     Head Circumference --      Peak Flow --      Pain Score 08/24/19 1006 10     Pain Loc --      Pain Edu? --      Excl. in GC? --      Constitutional: Alert and oriented. Well appearing and in no acute distress.  Conversational in full sentences. Eyes: Conjunctivae are normal. PERRL.  EOMI. Head: Atraumatic. Nose: No congestion/rhinnorhea. Mouth/Throat: Mucous membranes are moist.  Oropharynx non-erythematous. Neck: No stridor. No cervical spine tenderness to palpation. Cardiovascular: Normal rate, regular rhythm. Grossly normal heart sounds.  Good peripheral circulation. Respiratory: Normal respiratory effort.  No retractions. Lungs CTAB. Gastrointestinal: Soft , nondistended. No abdominal bruits. No CVA tenderness. Primarily lower abdominal tenderness with intermittent involuntary guarding with deeper palpations.  No CVA tenderness bilaterally.  Nontender in the upper quadrants. Musculoskeletal: No lower extremity tenderness nor edema.  No joint effusions. No signs of acute trauma. Neurologic:  Normal speech and language. No gross focal neurologic deficits are appreciated. No gait instability noted. Skin:  Skin is warm, dry and intact. No rash noted. Psychiatric: Mood and affect are normal.  Slow speech cadence.  ____________________________________________   LABS (all labs ordered are listed, but only abnormal results are displayed)  Labs Reviewed  COMPREHENSIVE METABOLIC PANEL - Abnormal; Notable for the following components:      Result Value   Glucose, Bld 151 (*)    All other components within normal limits  CBC - Abnormal; Notable for the following components:   WBC 11.6 (*)    All other components within normal limits  URINALYSIS, COMPLETE (UACMP) WITH MICROSCOPIC - Abnormal; Notable for the following components:   Color, Urine YELLOW (*)    APPearance CLEAR (*)    Ketones, ur 5 (*)    All other components within normal limits  SARS CORONAVIRUS 2 BY RT PCR (HOSPITAL ORDER, PERFORMED IN [facility])  LIPASE, BLOOD  SURGICAL PATHOLOGY   ____________________________________________  12 Lead EKG   ____________________________________________  RADIOLOGY  ED MD interpretation:  \  Official radiology report(s): CT ABDOMEN PELVIS W  CONTRAST  Result Date: 08/24/2019 CLINICAL DATA:  Mid abdominal pain with nausea and vomiting since last night EXAM: CT ABDOMEN AND PELVIS WITH CONTRAST TECHNIQUE: Multidetector CT imaging of the abdomen and pelvis was performed using the standard protocol following bolus administration of intravenous contrast. CONTRAST:  08/26/2019 OMNIPAQUE IOHEXOL 300 MG/ML  SOLN COMPARISON:  None. FINDINGS: Lower chest: Small hiatal hernia. Hepatobiliary: No focal liver abnormality is seen. No gallstones, gallbladder wall thickening, or biliary dilatation. Pancreas: Unremarkable. No pancreatic ductal dilatation or surrounding inflammatory changes. Spleen: Normal in size without focal abnormality. Adrenals/Urinary Tract: Adrenal glands are unremarkable. Kidneys are normal, without renal calculi, focal lesion, or hydronephrosis. Bladder is unremarkable. Stomach/Bowel: Stomach is within normal limits. The appendix is dilated measuring 1.3 cm in diameter. There are portions of the wall that appear indistinct concerning for early perforation. There is no drainable collection or free air. No evidence of bowel wall thickening, distention, or inflammatory changes. Vascular/Lymphatic: Aortic atherosclerosis. No enlarged abdominal or pelvic lymph nodes. Reproductive: Prostate is unremarkable. Other: Small bilateral fat containing inguinal hernias. No abdominopelvic ascites.  Musculoskeletal: Degenerative disc disease in the lumbar spine most pronounced at L2-3. IMPRESSION: 1. Findings consistent with acute appendicitis with portions of the wall that appear indistinct concerning for early perforation. No drainable collection or free air. 2. Small hiatal hernia. 3. Aortic atherosclerosis. Aortic Atherosclerosis (ICD10-I70.0). Electronically Signed   By: Emmaline Kluver M.D.   On: 08/24/2019 15:00    ____________________________________________   PROCEDURES  Procedure(s) performed (including Critical  Care):  Procedures   ____________________________________________   INITIAL IMPRESSION / ASSESSMENT AND PLAN / ED COURSE  59 year old male presenting with 12 hours of lower abdominal pain, found to have evidence of acute appendicitis requiring IV antibiotics and surgical admission.  Hemodynamically stable with normal vital signs.  Exam with lower quadrant tenderness, primarily to the suprapubic and RLQ with involuntary guarding.  Unremarkable blood work.  CT imaging with evidence of acute appendicitis with possible perforation, without abscess or gangrene.  Patient provided IV Rocephin and Flagyl.  Surgery was consulted and will admit the patient for further work-up and management.  Clinical Course as of Aug 24 2231  Thu Aug 24, 2019  1639 Educated patient of acute appendicitis with plan for IV antibiotics and surgical consultation with likely appendectomy.   [DS]  1642 Spoke with Dr. Claudine Mouton, who will see the patient   [DS]    Clinical Course User Index [DS] Delton Prairie, MD     ____________________________________________   FINAL CLINICAL IMPRESSION(S) / ED DIAGNOSES  Final diagnoses:  Acute appendicitis with perforation and localized peritonitis, without abscess or gangrene  RLQ abdominal pain  Nausea     ED Discharge Orders    None       Leone Mobley Katrinka Blazing   Note:  This document was prepared using Dragon voice recognition software and may include unintentional dictation errors.   Delton Prairie, MD 08/24/19 [phone]

## 2019-08-24 NOTE — Anesthesia Procedure Notes (Signed)
Procedure Name: Intubation Date/Time: 08/24/2019 6:44 PM Performed by: Irving Burton, CRNA Pre-anesthesia Checklist: Patient identified, Emergency Drugs available, Suction available and Patient being monitored Patient Re-evaluated:Patient Re-evaluated prior to induction Oxygen Delivery Method: Circle system utilized Preoxygenation: Pre-oxygenation with 100% oxygen Induction Type: IV induction Laryngoscope Size: McGraph and 4 Grade View: Grade I Tube type: Oral Tube size: 7.5 mm Number of attempts: 1 Airway Equipment and Method: Stylet and Video-laryngoscopy Placement Confirmation: ETT inserted through vocal cords under direct vision,  positive ETCO2 and breath sounds checked- equal and bilateral Secured at: 22 cm Tube secured with: Tape Dental Injury: Teeth and Oropharynx as per pre-operative assessment

## 2019-08-24 NOTE — ED Notes (Signed)
Patient reports feeling nauseous at this time. Katrinka Blazing, MD made aware. Zofran 4mg  reordered for administration.

## 2019-08-24 NOTE — Transfer of Care (Signed)
Immediate Anesthesia Transfer of Care Note  Patient: Victor Alvarez  Procedure(s) Performed: XI ROBOTIC LAPAROSCOPIC ASSISTED APPENDECTOMY (N/A )  Patient Location: PACU  Anesthesia Type:General  Level of Consciousness: awake, alert  and oriented  Airway & Oxygen Therapy: Patient connected to face mask oxygen  Post-op Assessment: Post -op Vital signs reviewed and stable  Post vital signs: stable  Last Vitals:  Vitals Value Taken Time  BP 159/96 08/24/19 2033  Temp    Pulse 91 08/24/19 2034  Resp 19 08/24/19 2034  SpO2 96 % 08/24/19 2034  Vitals shown include unvalidated device data.  Last Pain:  Vitals:   08/24/19 1444  TempSrc:   PainSc: 3          Complications: No complications documented.

## 2019-08-24 NOTE — Op Note (Signed)
Robotic appendectomy  Pre-operative Diagnosis: Acute appendicitis  Post-operative Diagnosis: same.   Surgeon: Campbell Lerner, M.D., [facility]  Anesthesia: General endotracheal  Findings: As expected swollen with superlative exudation, with localized peritonitis with adhesions.  No evidence of gangrene, no evidence of perforation.  Estimated Blood Loss: 50 mL         Specimens: Appendix          Complications: none              Procedure Details  The patient was seen again in the Holding Room. The benefits, complications, treatment options, and expected outcomes were discussed with the patient. The risks of bleeding, infection, recurrence of symptoms, failure to resolve symptoms, unanticipated injury, prosthetic placement, prosthetic infection, any of which could require further surgery were reviewed with the patient. The likelihood of improving the patient's symptoms with return to their baseline status is reasonable.  The patient and/or family concurred with the proposed plan, giving informed consent.  The patient was taken to Operating Room, identified and the procedure verified.    Prior to the induction of general anesthesia, antibiotic prophylaxis was administered. VTE prophylaxis was in place.  General anesthesia was then administered and tolerated well. After the induction, the patient was positioned in the supine position and the abdomen was prepped with Chloraprep and draped in the sterile fashion.  A Time Out was held and the above information confirmed. After local infiltration of quarter percent Marcaine with epinephrine, stab incision was made left upper quadrant.  Just below the costal margin approximately midclavicular line the Veress needle is passed with sensation of the layers to penetrate the abdominal wall and into the peritoneum.  Saline drop test is confirmed peritoneal placement.  Insufflation is initiated with carbon dioxide to pressures of 15 mmHg.  With local  anesthetic infiltration, a left lower quadrant incision is made, and a robotic 8.5 mm trocar is passed into the peritoneal cavity.  An additional 8.5 mm robotic trochars placed in the left suprapubic area and in the left upper abdominal wall under direct visualization. Using a force bipolar grasper and monopolar scissors proceeded with dissecting out the soft tissues adjacent to the cecum and appendix to fully identify the appendix, and mobilize it. The mesoappendix was carefully divided utilizing bipolar cautery, monopolar cautery and scissors.  There was some bleeding from the appendiceal artery which obscured vision, scope was replaced after cleaning and hemostasis was obtained with bipolar cautery. With the appendiceal cecal junction fully isolated, the appendiceal base was doubly ligated with ligature of 2-0 silk, another ligature was utilized to ligate the appendiceal side of the base of the appendix, and the appendix was divided with monopolar scissors, the appendiceal mucosa was then fulgurated with the same. I utilized a 0 silk to dunk the appendiceal stump into the cecal serosa, burying the stump with a figure-of-eight of suture. We then undocked the robot and proceeded with completing the procedure laparoscopically.  We then placed the appendix in a retrieval bag and withdrew it out the largest port site. I then proceeded with utilizing almost 3 L of sterile saline irrigant and aspiration to cleanse the abdomen. We closed the largest port site utilizing PMI and cone with a 0 Vicryl under direct visualization.  The abdomen was then desufflated and the trochars removed. Incisions were then irrigated and closed with subcuticulars of 4-0 Monocryl.  Skin sealed with Dermabond.  Patient tolerated procedure well.    Campbell Lerner M.D., [facility] 08/24/2019 8:25  PM

## 2019-08-24 NOTE — Anesthesia Postprocedure Evaluation (Signed)
Anesthesia Post Note  Patient: Dodger Sinning  Procedure(s) Performed: XI ROBOTIC LAPAROSCOPIC ASSISTED APPENDECTOMY (N/A )  Patient location during evaluation: PACU Anesthesia Type: General Level of consciousness: awake and alert Pain management: pain level controlled Vital Signs Assessment: post-procedure vital signs reviewed and stable Respiratory status: spontaneous breathing, nonlabored ventilation and respiratory function stable Cardiovascular status: blood pressure returned to baseline and stable Postop Assessment: no apparent nausea or vomiting Anesthetic complications: no   No complications documented.   Last Vitals:  Vitals:   08/24/19 2049 08/24/19 2104  BP: (!) 140/79 (!) 155/97  Pulse: 84 87  Resp: 14 14  Temp:    SpO2: 93% 93%    Last Pain:  Vitals:   08/24/19 2104  TempSrc:   PainSc: 0-No pain                 Aurelio Brash Lemuel Boodram

## 2019-08-24 NOTE — ED Triage Notes (Signed)
C/O generalized abdominal pain this morning.  C/O nausea.  AAOx3.  Skin warm and dry. NAD

## 2019-08-25 ENCOUNTER — Encounter: Payer: Self-pay | Admitting: Surgery

## 2019-08-25 MED ORDER — IBUPROFEN 800 MG PO TABS
800.0000 mg | ORAL_TABLET | Freq: Three times a day (TID) | ORAL | 0 refills | Status: DC | PRN
Start: 2019-08-25 — End: 2023-01-22

## 2019-08-25 NOTE — Discharge Instructions (Signed)
In addition to included general post-operative instructions for laparoscopic appendectomy,  Diet: Resume home diet.   Activity: No heavy lifting >20 pounds (children, pets, laundry, garbage) for 4 weeks, but light activity and walking are encouraged. Do not drive or drink alcohol if taking narcotic pain medications or having pain that might distract from driving.  Wound care: You may shower/get incision wet with soapy water and pat dry (do not rub incisions), but no baths or submerging incision underwater until follow-up.   Medications: Resume all home medications. For mild to moderate pain: acetaminophen (Tylenol) or ibuprofen/naproxen (if no kidney disease). Combining Tylenol with alcohol can substantially increase your risk of causing liver disease. Narcotic pain medications, if prescribed, can be used for severe pain, though may cause nausea, constipation, and drowsiness. Do not combine Tylenol and Percocet (or similar) within a 6 hour period as Percocet (and similar) contain(s) Tylenol. If you do not need the narcotic pain medication, you do not need to fill the prescription.  Call office ([phone] / [phone]) at any time if any questions, worsening pain, fevers/chills, bleeding, drainage from incision site, or other concerns.  

## 2019-08-25 NOTE — Discharge Summary (Signed)
[facility] SURGICAL DISCHARGE SUMMARY   Patient ID: Demaryius Imran MRN: [id number] DOB/AGE: 08-07-1960 59 y.o.  Admit date: 08/24/2019 Discharge date: 08/25/2019  Discharge Diagnoses Patient Active Problem List   Diagnosis Date Noted  . Acute appendicitis 08/24/2019    Consultants None  Procedures 08/24/2019:  Robotic assisted laparoscopic appendectomy  HPI: Erhard Senske is a 59 y.o. male with a 12-hour history of lower abdominal pain with associated nausea and crampy abdominal distention.  Was awakened from sleep, persistent nausea without vomiting.  Pain was initially somewhat diffuse, now more localized to the right lower quadrant.  Had small bowel movement this morning along with flatus, but no improvement in symptoms.  Denies any history of hematuria, dysuria or urinary urgency.  No prior history of abdominal surgery.   Hospital Course: Informed consent was obtained and documented, and patient underwent uneventful robotic assisted laparoscopic appendectomy (Dr Claudine Mouton, 08/24/2019).  Post-operatively, patient's pain and symptoms improved/resolved and advancement of patient's diet and ambulation were well-tolerated. The remainder of patient's hospital course was essentially unremarkable, and discharge planning was initiated accordingly with patient safely able to be discharged home with appropriate discharge instructions, pain control, and outpatient follow-up after all of his questions were answered to his expressed satisfaction.   Discharge Condition: Good   Physical Examination:  Constitutional: Well appearing male, NAD Pulmonary: Normal effort, no respiratory distress Gastrointestinal: Soft, non-tender, non-distended, no rebound/guarding Skin: Laparoscopic incisions are CDI with dermabond, no erythema, no drainage   Allergies as of 08/25/2019      Reactions   Codeine Nausea And Vomiting      Medication List    TAKE these medications   ALPRAZolam 1 MG  tablet Commonly known as: XANAX Take 1 mg by mouth 3 (three) times daily.   aspirin 81 MG EC tablet Take 81 mg by mouth daily.   B-complex with vitamin C tablet Take 1 tablet by mouth daily.   busPIRone 10 MG tablet Commonly known as: BUSPAR Take 20 mg by mouth 2 (two) times daily.   ibuprofen 800 MG tablet Commonly known as: ADVIL Take 1 tablet (800 mg total) by mouth every 8 (eight) hours as needed.   paliperidone 6 MG 24 hr tablet Commonly known as: INVEGA Take 6 mg by mouth every morning.   ProAir HFA 108 (90 Base) MCG/ACT inhaler Generic drug: albuterol Inhale 2 puffs into the lungs every 6 (six) hours as needed for wheezing or shortness of breath.   QUEtiapine 25 MG tablet Commonly known as: SEROQUEL Take 25-50 mg by mouth every evening.   rosuvastatin 10 MG tablet Commonly known as: CRESTOR Take 10 mg by mouth daily.   Viibryd 40 MG Tabs Generic drug: Vilazodone HCl Take 40 mg by mouth daily.   zolpidem 10 MG tablet Commonly known as: AMBIEN Take 10 mg by mouth at bedtime.         Follow-up Information    Campbell Lerner, MD. Schedule an appointment as soon as possible for a visit in 2 week(s).   Specialty: General Surgery Why: s/p laparoscopic appendctomy  Contact information: [address] [place] [address] [phone]                Time spent on discharge management including discussion of hospital course, clinical condition, outpatient instructions, prescriptions, and follow up with the patient and members of the medical team: >30 minutes  -- Lynden Oxford , PA-C [facility]  08/25/2019, 8:03 AM [phone] M-F: 7am - 4pm

## 2019-08-25 NOTE — Progress Notes (Signed)
Victor Alvarez  A and O x 4 VSS. Pt tolerating diet well. No complaints of pain or nausea. IV removed intact, prescriptions given. Pt voices understanding of discharge instructions with no further questions. Pt discharged via wheelchair with axillary.   Allergies as of 08/25/2019      Reactions   Codeine Nausea And Vomiting      Medication List    TAKE these medications   ALPRAZolam 1 MG tablet Commonly known as: XANAX Take 1 mg by mouth 3 (three) times daily.   aspirin 81 MG EC tablet Take 81 mg by mouth daily.   B-complex with vitamin C tablet Take 1 tablet by mouth daily.   busPIRone 10 MG tablet Commonly known as: BUSPAR Take 20 mg by mouth 2 (two) times daily.   ibuprofen 800 MG tablet Commonly known as: ADVIL Take 1 tablet (800 mg total) by mouth every 8 (eight) hours as needed.   paliperidone 6 MG 24 hr tablet Commonly known as: INVEGA Take 6 mg by mouth every morning.   ProAir HFA 108 (90 Base) MCG/ACT inhaler Generic drug: albuterol Inhale 2 puffs into the lungs every 6 (six) hours as needed for wheezing or shortness of breath.   QUEtiapine 25 MG tablet Commonly known as: SEROQUEL Take 25-50 mg by mouth every evening.   rosuvastatin 10 MG tablet Commonly known as: CRESTOR Take 10 mg by mouth daily.   Viibryd 40 MG Tabs Generic drug: Vilazodone HCl Take 40 mg by mouth daily.   zolpidem 10 MG tablet Commonly known as: AMBIEN Take 10 mg by mouth at bedtime.       Vitals:   08/25/19 0520 08/25/19 0804  BP: (!) 103/57 121/77  Pulse: 91 93  Resp: 20 16  Temp: 98.2 F (36.8 C) 97.7 F (36.5 C)  SpO2: 96% 91%    Hannan Tetzlaff Etheleen Mayhew

## 2019-08-29 LAB — SURGICAL PATHOLOGY

## 2019-09-07 ENCOUNTER — Other Ambulatory Visit: Payer: Self-pay

## 2019-09-07 ENCOUNTER — Encounter: Payer: Self-pay | Admitting: Physician Assistant

## 2019-09-07 ENCOUNTER — Ambulatory Visit (INDEPENDENT_AMBULATORY_CARE_PROVIDER_SITE_OTHER): Payer: Self-pay | Admitting: Physician Assistant

## 2019-09-07 VITALS — BP 123/71 | HR 85 | Temp 98.2°F | Resp 12 | Ht 71.0 in | Wt 220.8 lb

## 2019-09-07 DIAGNOSIS — Z09 Encounter for follow-up examination after completed treatment for conditions other than malignant neoplasm: Secondary | ICD-10-CM

## 2019-09-07 DIAGNOSIS — K3533 Acute appendicitis with perforation and localized peritonitis, with abscess: Secondary | ICD-10-CM

## 2019-09-07 NOTE — Patient Instructions (Signed)
Follow up as needed, call the office if you have any questions or concerns.   GENERAL POST-OPERATIVE PATIENT INSTRUCTIONS   WOUND CARE INSTRUCTIONS:  Keep a dry clean dressing on the wound if there is drainage. The initial bandage may be removed after 24 hours.  Once the wound has quit draining you may leave it open to air.  If clothing rubs against the wound or causes irritation and the wound is not draining you may cover it with a dry dressing during the daytime.  Try to keep the wound dry and avoid ointments on the wound unless directed to do so.  If the wound becomes bright red and painful or starts to drain infected material that is not clear, please contact your physician immediately.  If the wound is mildly pink and has a thick firm ridge underneath it, this is normal, and is referred to as a healing ridge.  This will resolve over the next 4-6 weeks.  BATHING: You may shower if you have been informed of this by your surgeon. However, Please do not submerge in a tub, hot tub, or pool until incisions are completely sealed or have been told by your surgeon that you may do so.  DIET:  You may eat any foods that you can tolerate.  It is a good idea to eat a high fiber diet and take in plenty of fluids to prevent constipation.  If you do become constipated you may want to take a mild laxative or take ducolax tablets on a daily basis until your bowel habits are regular.  Constipation can be very uncomfortable, along with straining, after recent surgery.  ACTIVITY:  You are encouraged to cough and deep breath or use your incentive spirometer if you were given one, every 15-30 minutes when awake.  This will help prevent respiratory complications and low grade fevers post-operatively if you had a general anesthetic.  You may want to hug a pillow when coughing and sneezing to add additional support to the surgical area, if you had abdominal or chest surgery, which will decrease pain during these times.  You  are encouraged to walk and engage in light activity for the next two weeks.  You should not lift more than 20 pounds four-six weeks after surgery as it could put you at increased risk for complications.  Twenty pounds is roughly equivalent to a plastic bag of groceries. At that time- Listen to your body when lifting, if you have pain when lifting, stop and then try again in a few days. Soreness after doing exercises or activities of daily living is normal as you get back in to your normal routine.  MEDICATIONS:  Try to take narcotic medications and anti-inflammatory medications, such as tylenol, ibuprofen, naprosyn, etc., with food.  This will minimize stomach upset from the medication.  Should you develop nausea and vomiting from the pain medication, or develop a rash, please discontinue the medication and contact your physician.  You should not drive, make important decisions, or operate machinery when taking narcotic pain medication.  SUNBLOCK Use sun block to incision area over the next year if this area will be exposed to sun. This helps decrease scarring and will allow you avoid a permanent darkened area over your incision.  QUESTIONS:  Please feel free to call our office if you have any questions, and we will be glad to assist you.

## 2019-09-07 NOTE — Progress Notes (Signed)
[facility] POST-OP OFFICE VISIT  09/07/2019  HPI: Victor Alvarez is a 59 y.o. male 14 days s/p robotic assisted laparoscopic appendectomy for acute appendicitis with Dr Claudine Mouton.   He is overall doing well aside from intermittent "twinges" of pain in his RLQ. Only taking Ibuprofen prn for this No fever, chills, nausea, or emesis No issues with PO intake or bowel function Incisions are healing well  Vital signs: BP 123/71   Pulse 85   Temp 98.2 F (36.8 C)   Resp 12   Ht 5\' 11"  (1.803 m)   Wt 220 lb 12.8 oz (100.2 kg)   SpO2 94%   BMI 30.80 kg/m    Physical Exam: Constitutional: Well appearing male, NAD Abdomen: Soft, non-tender, non-distended, no rebound/guarding Skin: Laparoscopic incision are well healed, no erythema or drainage   Assessment/Plan: This is a 59 y.o. male 14 days s/p robotic assisted laparoscopic appendectomy for acute appendicitis   - Pain control prn with Ibuprofen / tylenol  - Reviewed wound care  - Reviewed lifting restrictions; 2 more weeks  - Reviewed pathology: Acute appendicitis with diverticulitis and contained abscess  - rtc prn  -- 75, PA-C [facility] 09/07/2019, 10:30 AM [phone] M-F: 7am - 4pm

## 2020-09-24 DIAGNOSIS — R110 Nausea: Secondary | ICD-10-CM | POA: Insufficient documentation

## 2020-09-24 DIAGNOSIS — E559 Vitamin D deficiency, unspecified: Secondary | ICD-10-CM | POA: Insufficient documentation

## 2020-09-24 DIAGNOSIS — R5383 Other fatigue: Secondary | ICD-10-CM | POA: Insufficient documentation

## 2020-09-24 DIAGNOSIS — F410 Panic disorder [episodic paroxysmal anxiety] without agoraphobia: Secondary | ICD-10-CM | POA: Insufficient documentation

## 2020-09-24 DIAGNOSIS — J309 Allergic rhinitis, unspecified: Secondary | ICD-10-CM | POA: Insufficient documentation

## 2020-09-24 DIAGNOSIS — F32A Depression, unspecified: Secondary | ICD-10-CM | POA: Insufficient documentation

## 2020-09-24 DIAGNOSIS — J449 Chronic obstructive pulmonary disease, unspecified: Secondary | ICD-10-CM | POA: Insufficient documentation

## 2020-09-24 DIAGNOSIS — G4700 Insomnia, unspecified: Secondary | ICD-10-CM | POA: Insufficient documentation

## 2020-09-24 DIAGNOSIS — F419 Anxiety disorder, unspecified: Secondary | ICD-10-CM | POA: Insufficient documentation

## 2020-09-24 DIAGNOSIS — K219 Gastro-esophageal reflux disease without esophagitis: Secondary | ICD-10-CM | POA: Insufficient documentation

## 2020-09-24 DIAGNOSIS — E7800 Pure hypercholesterolemia, unspecified: Secondary | ICD-10-CM | POA: Insufficient documentation

## 2020-09-24 DIAGNOSIS — G4733 Obstructive sleep apnea (adult) (pediatric): Secondary | ICD-10-CM | POA: Insufficient documentation

## 2020-09-24 DIAGNOSIS — R0683 Snoring: Secondary | ICD-10-CM | POA: Insufficient documentation

## 2020-09-24 DIAGNOSIS — E119 Type 2 diabetes mellitus without complications: Secondary | ICD-10-CM | POA: Insufficient documentation

## 2021-02-06 DIAGNOSIS — K2970 Gastritis, unspecified, without bleeding: Secondary | ICD-10-CM | POA: Insufficient documentation

## 2021-04-30 DIAGNOSIS — F1021 Alcohol dependence, in remission: Secondary | ICD-10-CM | POA: Insufficient documentation

## 2021-07-04 DIAGNOSIS — M62830 Muscle spasm of back: Secondary | ICD-10-CM | POA: Insufficient documentation

## 2021-07-04 DIAGNOSIS — M545 Low back pain, unspecified: Secondary | ICD-10-CM | POA: Insufficient documentation

## 2021-07-14 ENCOUNTER — Emergency Department: Payer: Medicaid - Out of State

## 2021-07-14 ENCOUNTER — Emergency Department
Admission: EM | Admit: 2021-07-14 | Discharge: 2021-07-14 | Disposition: A | Discharge status: Home / Self Care | Payer: Medicaid - Out of State | Attending: Emergency Medicine | Admitting: Emergency Medicine

## 2021-07-14 DIAGNOSIS — L03116 Cellulitis of left lower limb: Secondary | ICD-10-CM | POA: Insufficient documentation

## 2021-07-14 DIAGNOSIS — L299 Pruritus, unspecified: Secondary | ICD-10-CM | POA: Diagnosis present

## 2021-07-14 LAB — CBC WITH DIFFERENTIAL/PLATELET
Abs Immature Granulocytes: 0.09 10*3/uL — ABNORMAL HIGH (ref 0.00–0.07)
Basophils Absolute: 0.1 10*3/uL (ref 0.0–0.1)
Basophils Relative: 1 %
Eosinophils Absolute: 0.3 10*3/uL (ref 0.0–0.5)
Eosinophils Relative: 3 %
HCT: 47.9 % (ref 39.0–52.0)
Hemoglobin: 15.1 g/dL (ref 13.0–17.0)
Immature Granulocytes: 1 %
Lymphocytes Relative: 26 %
Lymphs Abs: 3.1 10*3/uL (ref 0.7–4.0)
MCH: 29.2 pg (ref 26.0–34.0)
MCHC: 31.5 g/dL (ref 30.0–36.0)
MCV: 92.6 fL (ref 80.0–100.0)
Monocytes Absolute: 1 10*3/uL (ref 0.1–1.0)
Monocytes Relative: 8 %
Neutro Abs: 7.4 10*3/uL (ref 1.7–7.7)
Neutrophils Relative %: 61 %
Platelets: 382 10*3/uL (ref 150–400)
RBC: 5.17 MIL/uL (ref 4.22–5.81)
RDW: 13.9 % (ref 11.5–15.5)
WBC: 12 10*3/uL — ABNORMAL HIGH (ref 4.0–10.5)
nRBC: 0 % (ref 0.0–0.2)

## 2021-07-14 LAB — COMPREHENSIVE METABOLIC PANEL
ALT: 30 U/L (ref 0–44)
AST: 26 U/L (ref 15–41)
Albumin: 3.9 g/dL (ref 3.5–5.0)
Alkaline Phosphatase: 81 U/L (ref 38–126)
Anion gap: 6 (ref 5–15)
BUN: 13 mg/dL (ref 8–23)
CO2: 31 mmol/L (ref 22–32)
Calcium: 9.2 mg/dL (ref 8.9–10.3)
Chloride: 100 mmol/L (ref 98–111)
Creatinine, Ser: 1.09 mg/dL (ref 0.61–1.24)
GFR, Estimated: >60 mL/min (ref >60)
Glucose, Bld: 164 mg/dL — ABNORMAL HIGH (ref 70–99)
Potassium: 4.1 mmol/L (ref 3.5–5.1)
Sodium: 137 mmol/L (ref 135–145)
Total Bilirubin: 0.7 mg/dL (ref 0.3–1.2)
Total Protein: 7.7 g/dL (ref 6.5–8.1)

## 2021-07-14 MED ORDER — VANCOMYCIN HCL IN DEXTROSE 1-5 GM/200ML-% IV SOLN
1000.0000 mg | Freq: Once | INTRAVENOUS | Status: AC
  Administered 2021-07-14: 1000 mg via INTRAVENOUS
  Filled 2021-07-14: qty 200

## 2021-07-14 MED ORDER — SODIUM CHLORIDE 0.9 % IV SOLN
2.0000 g | Freq: Once | INTRAVENOUS | Status: AC
  Administered 2021-07-14: 2 g via INTRAVENOUS
  Filled 2021-07-14: qty 20

## 2021-07-14 MED ORDER — DOXYCYCLINE HYCLATE 100 MG PO CAPS: 100.0000 mg | ORAL_CAPSULE | Freq: Two times a day (BID) | ORAL | 0 refills | Status: DC

## 2021-07-14 NOTE — Consult Note (Signed)
PHARMACY -  BRIEF ANTIBIOTIC NOTE   Pharmacy has received consult(s) for vancomycin from an ED provider.  The patient's profile has been reviewed for ht/wt/allergies/indication/available labs.    One time order(s) placed for  Vancomycin 1000 mg   Further antibiotics/pharmacy consults should be ordered by admitting physician if indicated.                       Thank you, Sharen Hones, PharmD, BCPS Clinical Pharmacist   07/14/2021  8:52 PM
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
## 2021-07-14 NOTE — Discharge Instructions (Addendum)
Please call [facility] to set up an appointment for evaluation this Thursday with Dr. Lilian Kapur or one of his partners.  Return to the emergency room right away if your symptoms are worsening, increasing redness, foot pain, fevers chills, or any new or concerning symptoms arise.

## 2021-07-14 NOTE — ED Triage Notes (Signed)
Patient to ER via [facility] with complaints of insect bite present to left foot. Reports that he believes he scratched it during his sleep. Area large and red, swollen with purulent drainage. Red streaking extending to ankle. No known fevers at home.

## 2021-08-08 DIAGNOSIS — R42 Dizziness and giddiness: Secondary | ICD-10-CM | POA: Insufficient documentation

## 2021-08-08 DIAGNOSIS — H9313 Tinnitus, bilateral: Secondary | ICD-10-CM | POA: Insufficient documentation

## 2021-08-08 DIAGNOSIS — Z634 Disappearance and death of family member: Secondary | ICD-10-CM | POA: Insufficient documentation

## 2021-08-16 DIAGNOSIS — R55 Syncope and collapse: Secondary | ICD-10-CM | POA: Insufficient documentation

## 2022-02-03 DIAGNOSIS — D751 Secondary polycythemia: Secondary | ICD-10-CM | POA: Insufficient documentation

## 2022-04-06 DIAGNOSIS — R251 Tremor, unspecified: Secondary | ICD-10-CM | POA: Insufficient documentation

## 2022-04-21 DIAGNOSIS — N410 Acute prostatitis: Secondary | ICD-10-CM | POA: Insufficient documentation

## 2022-06-03 DIAGNOSIS — S62102A Fracture of unspecified carpal bone, left wrist, initial encounter for closed fracture: Secondary | ICD-10-CM | POA: Insufficient documentation

## 2022-10-30 ENCOUNTER — Telehealth: Payer: MEDICAID | Admitting: Family Medicine

## 2022-10-30 ENCOUNTER — Telehealth: Payer: Self-pay | Admitting: Physician Assistant

## 2022-10-30 DIAGNOSIS — F339 Major depressive disorder, recurrent, unspecified: Secondary | ICD-10-CM

## 2022-10-30 DIAGNOSIS — F4310 Post-traumatic stress disorder, unspecified: Secondary | ICD-10-CM | POA: Diagnosis not present

## 2022-10-30 MED ORDER — TRAZODONE HCL 100 MG PO TABS: 300.0000 mg | ORAL_TABLET | Freq: Every day | ORAL | 0 refills | Status: DC

## 2022-10-30 MED ORDER — ESCITALOPRAM OXALATE 20 MG PO TABS: 20.0000 mg | ORAL_TABLET | Freq: Every day | ORAL | 0 refills | Status: DC

## 2022-10-30 MED ORDER — PRAZOSIN HCL 5 MG PO CAPS: 5.0000 mg | ORAL_CAPSULE | Freq: Every day | ORAL | 0 refills | Status: DC

## 2022-10-30 MED ORDER — CARIPRAZINE HCL 3 MG PO CAPS: 3.0000 mg | ORAL_CAPSULE | Freq: Every day | ORAL | 0 refills | Status: DC

## 2022-10-30 MED ORDER — ARIPIPRAZOLE 10 MG PO TABS: 10.0000 mg | ORAL_TABLET | Freq: Every day | ORAL | 0 refills | Status: DC

## 2022-10-30 MED ORDER — VITAMIN D3 50 MCG (2000 UT) PO CAPS: 2000.0000 [IU] | ORAL_CAPSULE | Freq: Every day | ORAL | 0 refills | Status: AC

## 2022-10-30 MED ORDER — DIVALPROEX SODIUM 250 MG PO DR TAB: 250.0000 mg | DELAYED_RELEASE_TABLET | Freq: Two times a day (BID) | ORAL | 0 refills | Status: DC

## 2022-10-30 NOTE — Progress Notes (Signed)
Virtual Visit Consent   Victor Alvarez, you are scheduled for a virtual visit with a [facility] provider today. Just as with appointments in the office, your consent must be obtained to participate. Your consent will be active for this visit and any virtual visit you may have with one of our providers in the next 365 days. If you have a MyChart account, a copy of this consent can be sent to you electronically.  As this is a virtual visit, video technology does not allow for your provider to perform a traditional examination. This may limit your provider's ability to fully assess your condition. If your provider identifies any concerns that need to be evaluated in person or the need to arrange testing (such as labs, EKG, etc.), we will make arrangements to do so. Although advances in technology are sophisticated, we cannot ensure that it will always work on either your end or our end. If the connection with a video visit is poor, the visit may have to be switched to a telephone visit. With either a video or telephone visit, we are not always able to ensure that we have a secure connection.  By engaging in this virtual visit, you consent to the provision of healthcare and authorize for your insurance to be billed (if applicable) for the services provided during this visit. Depending on your insurance coverage, you may receive a charge related to this service.  I need to obtain your verbal consent now. Are you willing to proceed with your visit today? Victor Alvarez has provided verbal consent on 10/30/2022 for a virtual visit (video or telephone). Georgana Curio, FNP  Date: 10/30/2022 12:07 PM  Virtual Visit via Video Note   I, Georgana Curio, connected with  Victor Alvarez  ([id number], 1960-12-16) on 10/30/22 at 11:30 AM EDT by a video-enabled telemedicine application and verified that I am speaking with the correct person using two identifiers.  Location: Patient: Virtual Visit Location Patient: Home Provider:  Virtual Visit Location Provider: Home Office   I discussed the limitations of evaluation and management by telemedicine and the availability of in person appointments. The patient expressed understanding and agreed to proceed.    History of Present Illness: Victor Alvarez is a 62 y.o. who identifies as a male who was assigned male at birth, and is being seen today for refills on psych meds. He is running out and does not have an apptmt until Nov 7th. He has not suicidal or homicidal thoughts. Marland Kitchen  HPI: HPI  Problems:  Patient Active Problem List   Diagnosis Date Noted   Acute appendicitis 08/24/2019    Allergies:  Allergies  Allergen Reactions   Codeine Nausea And Vomiting   Medications:  Current Outpatient Medications:    ARIPiprazole (ABILIFY) 10 MG tablet, Take 1 tablet (10 mg total) by mouth daily., Disp: 30 tablet, Rfl: 0   cariprazine (VRAYLAR) 3 MG capsule, Take 1 capsule (3 mg total) by mouth daily., Disp: 30 capsule, Rfl: 0   Cholecalciferol (VITAMIN D3) 50 MCG (2000 UT) capsule, Take 1 capsule (2,000 Units total) by mouth daily., Disp: 30 capsule, Rfl: 0   divalproex (DEPAKOTE) 250 MG DR tablet, Take 1 tablet (250 mg total) by mouth 2 (two) times daily., Disp: 60 tablet, Rfl: 0   escitalopram (LEXAPRO) 20 MG tablet, Take 1 tablet (20 mg total) by mouth daily., Disp: 30 tablet, Rfl: 0   prazosin (MINIPRESS) 5 MG capsule, Take 1 capsule (5 mg total) by mouth at bedtime., Disp: 30  capsule, Rfl: 0   traZODone (DESYREL) 100 MG tablet, Take 3 tablets (300 mg total) by mouth at bedtime., Disp: 90 tablet, Rfl: 0   ALPRAZolam (XANAX) 1 MG tablet, Take 1 mg by mouth 3 (three) times daily., Disp: , Rfl:    aspirin 81 MG EC tablet, Take 81 mg by mouth daily., Disp: , Rfl:    B Complex-C (B-COMPLEX WITH VITAMIN C) tablet, Take 1 tablet by mouth daily., Disp: , Rfl:    busPIRone (BUSPAR) 10 MG tablet, Take 20 mg by mouth 2 (two) times daily., Disp: , Rfl:    doxycycline (VIBRAMYCIN) 100 MG  capsule, Take 1 capsule (100 mg total) by mouth 2 (two) times daily., Disp: 20 capsule, Rfl: 0   ibuprofen (ADVIL) 800 MG tablet, Take 1 tablet (800 mg total) by mouth every 8 (eight) hours as needed., Disp: 30 tablet, Rfl: 0   paliperidone (INVEGA) 6 MG 24 hr tablet, Take 6 mg by mouth every morning., Disp: , Rfl:    PROAIR HFA 108 (90 Base) MCG/ACT inhaler, Inhale 2 puffs into the lungs every 6 (six) hours as needed for wheezing or shortness of breath., Disp: , Rfl:    QUEtiapine (SEROQUEL) 25 MG tablet, Take 25-50 mg by mouth every evening., Disp: , Rfl:    rosuvastatin (CRESTOR) 10 MG tablet, Take 10 mg by mouth daily., Disp: , Rfl:    VIIBRYD 40 MG TABS, Take 40 mg by mouth daily., Disp: , Rfl:    zolpidem (AMBIEN) 10 MG tablet, Take 10 mg by mouth at bedtime., Disp: , Rfl:   Observations/Objective: Patient is well-developed, well-nourished in no acute distress.  Resting comfortably  at home.  Head is normocephalic, atraumatic.  No labored breathing.  Speech is clear and coherent with logical content.  Patient is alert and oriented at baseline.    Assessment and Plan: 1. PTSD (post-traumatic stress disorder)  2. Depression, recurrent (HCC)  Meds refilled for 30 days only. He is instructed to call his provider to move up apptmt from Nov 7th. He is stable at this time  Follow Up Instructions: I discussed the assessment and treatment plan with the patient. The patient was provided an opportunity to ask questions and all were answered. The patient agreed with the plan and demonstrated an understanding of the instructions.  A copy of instructions were sent to the patient via MyChart unless otherwise noted below.     The patient was advised to call back or seek an in-person evaluation if the symptoms worsen or if the condition fails to improve as anticipated.  Time:  I spent 10 minutes with the patient via telehealth technology discussing the above problems/concerns.    Georgana Curio,  FNP

## 2022-10-30 NOTE — Telephone Encounter (Signed)
Spoke to patient and advised because you are not a current patient at the practice, the provider will not be able to fill your medication request. Patient was advised to call Dr. Langston Masker office for medication or I could assist in helping patient schedule a virtual urgent care appointment for medication management. Patient requested virtual UC. Patient has been scheduled with Virtual Urgent care today at 1130. Patient was instructed on how to access the video visit.

## 2022-10-30 NOTE — Patient Instructions (Signed)
Post-Traumatic Stress Disorder, Adult Post-traumatic stress disorder (PTSD) is a mental health condition. It may start after a life-changing event or trauma. PTSD can also occur in people who hear about trauma that happens to a close family member or friend. What are the causes? Events that can cause PTSD include: Situations where your life is in danger, such as in motor vehicle accidents or plane crashes. Violent crimes, such as kidnappings or public shootings. Disasters caused by nature or humans, such as fires or floods. Physical assault. Sexual assault. Any kind of abuse. PTSD can also occur after witnessing a violent event. This may be: In your community. At home (domestic violence). During a war. What increases the risk? You are more likely to develop this condition if: You are in or have been in the [employer]. Your life has been in danger. You have gone through a traumatic event, such as: An accident or serious illness. Rape or sexual assault. A terrorist act or gun violence. An accident or death involving a loved one. What are the signs or symptoms? PTSD symptoms may start soon after a traumatic event or weeks later. They can last from a month to years. Symptoms can affect relationships, work, and daily activities. They may include: Intrusive symptoms This is when you relive the trauma through: Dreams. Feelings of fear, horror, intense sadness, or anger. Unwanted memories. Physical reactions. These can include a fast heart rate, shortness of breath, sweating, and shaking. Flashbacks, or feeling like the past event is happening in the present. Avoidance symptoms This is when you avoid anything that reminds you of the trauma. You may: Lose interest or not take part in daily activities. Feel cut off from or stay away from other people. Isolate yourself. Increased arousal symptoms You may be very sensitive or react to certain settings. You may: Be easily startled. Behave in  a careless or self-destructive way. Become irritated easily. Feel worried and nervous. Have trouble focusing. Yell at or hit other people or objects. Have trouble sleeping. Negative mood and thoughts You may have negative thoughts and feelings about yourself and others. You may: Believe that you or others are bad. Not be able to remember certain parts of the traumatic event. Blame yourself or others for the trauma. Be unable to feel positive emotions, like happiness or love. How is this diagnosed? PTSD is diagnosed through an assessment by a mental health professional. Bonita Quin will be asked questions about your symptoms. How is this treated? Treatment may include: Medicines to reduce PTSD symptoms. Therapy. This is done with therapists who are experts in the treatment of PTSD. It may include: Counseling (cognitive behavioral therapy, or CBT). Exposure therapy. Eye movement desensitization and reprocessing (EMDR) therapy. If you have other mental health problems, these can also be treated. Follow these instructions at home: Alcohol use Do not drink alcohol if: Your health care provider tells you not to drink. You are pregnant, may be pregnant, or are planning to become pregnant. If you drink alcohol: Limit how much you have to: 0-1 drink a day for women. 0-2 drinks a day for men. Know how much alcohol is in your drink. In the [place] one drink equals one 12 oz bottle of beer (355 mL), one 5 oz glass of wine (148 mL), or one 1 oz glass of hard liquor (44 mL). Activity Exercise regularly. Try to do at least 30 minutes of physical activity most days of the week. Practice ways to calm yourself, such as: Breathing exercises. Meditation. Yoga.  Listening to quiet music. Do not cut yourself off from other people. Make connections. Think about volunteering. This can help you feel more connected. Lifestyle Find a support group. Groups are often available for [employer] veterans, trauma  victims, and family members or caregivers. Try to get 7-9 hours of sleep each night. To help with sleep: Keep your bedroom cool and dark. Do not eat a heavy meal within 1 hour of bedtime. Avoid caffeine for at least 8 hours before bed. Avoid screen time before bed. This includes television, computers, tablets, and mobile phones. Do not useillegal drugs. Contact a local organization to find out if you are eligible for a service dog. General instructions Take over-the-counter and prescription medicines only as told by your health care provider. Take steps to help yourself feel safer at home. Think about putting a security system in your home. Work with a health care provider or therapist to help manage your symptoms. If your PTSD affects your marriage or family, get help from a family therapist. Let others know that you have PTSD. Tell them what may trigger symptoms. This can protect you and help others understand you better. Let all of your health care providers know you have PTSD. This is important if you are having surgery or need to go to the hospital. Where to find more information [facility] for PTSD: ptsd.FitBoxer.tn The [employer] of Mental Health: BloggerCourse.com Contact a health care provider if: Your symptoms do not get better. You feel overwhelmed by your symptoms. You have trouble doing daily tasks. You have loss of appetite. You have trouble sleeping. Get help right away if: You have thoughts of hurting yourself or others. Get help right away if you feel like you may hurt yourself or others, or have thoughts about taking your own life. Go to your nearest emergency room or: Call 911. Call the National Suicide Prevention Lifeline at (912) 284-3653 or 988. This is open 24 hours a day. Text the Crisis Text Line at 909 708 9680. Summary Post-traumatic stress disorder (PTSD) is a mental health condition. It is more likely to start after a life-changing event or trauma. Treatment  for PTSD may include medicines and therapy. Find a support group in your community. Get help right away if you have thoughts of hurting yourself or others. This information is not intended to replace advice given to you by your health care provider. Make sure you discuss any questions you have with your health care provider. Document Revised: 05/19/2021 Document Reviewed: 05/09/2021 Elsevier Patient Education  2024 [employer].

## 2022-10-30 NOTE — Telephone Encounter (Signed)
Medication Refill - Medication:  Vraylar 3mg , Divalproex 250mg , Prazosin 5mg , Aripiprazole 10mg , Trazadone 100mg , Vitamin D3 2000units and Escitalopram 20mg .  Has the patient contacted their pharmacy? Yes.    Preferred Pharmacy (with phone number or street name):  [id number] [place], [place] - [address] Phone: [phone]  Fax: [phone]     Has the patient been seen for an appointment in the last year OR does the patient have an upcoming appointment? Yes.    Agent: Please be advised that RX refills may take up to 3 business days. We ask that you follow-up with your pharmacy.

## 2022-11-10 ENCOUNTER — Telehealth: Payer: Self-pay

## 2022-11-10 NOTE — Telephone Encounter (Signed)
Patient has a new patient appointment until 12/10/2022

## 2022-11-10 NOTE — Telephone Encounter (Signed)
Copied from CRM [id number]. Topic: General - Other >> Nov 09, 2022  3:26 PM Everette C wrote: Reason for CRM: The patient would like to be contacted by a member of staff when possible to review their medications with a member of clinical staff  Mr [facility] advocate has additional concerns related to the patient's prescriptions and their continuity. Carolin Guernsey, patient advocate can be reached also [phone]  Please contact further when possible

## 2022-11-11 NOTE — Telephone Encounter (Signed)
Patient notified

## 2022-12-06 NOTE — Progress Notes (Signed)
New patient visit  Patient: Victor Alvarez   DOB: [birth year]   62 y.o. Male  MRN: [id number] Visit Date: 12/10/2022  Today's healthcare provider: Debera Lat, PA-C   Chief Complaint  Patient presents with   Establish Care    Concern about sleeping medicine doesn't seem to be working anymore Needs new sleeping medicine Experiencing SOB even during sleep Wanting to quit smoking, needs something to help with that   Subjective    Victor Alvarez is a 62 y.o. male who presents today as a new patient to establish care.   Discussed the use of AI scribe software for clinical note transcription with the patient, who gave verbal consent to proceed.  History of Present Illness   The patient, with a complex medical history including mental health issues, allergies, acid reflux, high cholesterol, breathing problems, and a history of smoking, presents with multiple concerns. The patient reports feeling overwhelmed by the number of medications they are currently taking, many of which are for mental health issues. The patient has recently moved from [place] and has not yet established care with a mental health specialist in their new location. The patient also reports a history of smoking, currently about a pack a day, and has been diagnosed with possible COPD. The patient has been experiencing nightmares and has a history of PTSD. The patient also reports a broken wrist and has been using marijuana for pain relief. The patient has a history of colonoscopies due to polyps and is due for another one. The patient also reports frequent urination.        Past Medical History:  Diagnosis Date   Allergy    Arthritis    COPD (chronic obstructive pulmonary disease) (HCC)    Depression    Neuromuscular disorder (HCC)    PTSD (post-traumatic stress disorder)    Sleep apnea    Past Surgical History:  Procedure Laterality Date   WRIST SURGERY     XI ROBOTIC LAPAROSCOPIC ASSISTED APPENDECTOMY N/A 08/24/2019    Procedure: XI ROBOTIC LAPAROSCOPIC ASSISTED APPENDECTOMY;  Surgeon: Campbell Lerner, MD;  Location: [facility] ORS;  Service: General;  Laterality: N/A;   Family Status  Relation Name Status   Mother  Alive   Father  Deceased  No partnership data on file   No family history on file. Social History   Socioeconomic History   Marital status: Single    Spouse name: Not on file   Number of children: Not on file   Years of education: Not on file   Highest education level: Not on file  Occupational History   Not on file  Tobacco Use   Smoking status: Every Day    Types: Cigarettes   Smokeless tobacco: Never  Substance and Sexual Activity   Alcohol use: Not Currently   Drug use: Not Currently   Sexual activity: Not on file  Other Topics Concern   Not on file  Social History Narrative   Not on file   Social Determinants of Health   Financial Resource Strain: Not on file  Food Insecurity: Not on file  Transportation Needs: Not on file  Physical Activity: Not on file  Stress: Not on file  Social Connections: Not on file   Outpatient Medications Prior to Visit  Medication Sig Note   ALPRAZolam (XANAX) 1 MG tablet Take 1 mg by mouth 3 (three) times daily.    aspirin 81 MG EC tablet Take 81 mg by mouth daily.    cariprazine (VRAYLAR) 3 MG  capsule Take 1 capsule (3 mg total) by mouth daily.    doxycycline (VIBRAMYCIN) 100 MG capsule Take 1 capsule (100 mg total) by mouth 2 (two) times daily.    paliperidone (INVEGA) 6 MG 24 hr tablet Take 6 mg by mouth every morning.    PROAIR HFA 108 (90 Base) MCG/ACT inhaler Inhale 2 puffs into the lungs every 6 (six) hours as needed for wheezing or shortness of breath.    VIIBRYD 40 MG TABS Take 40 mg by mouth daily.    zolpidem (AMBIEN) 10 MG tablet Take 10 mg by mouth at bedtime.    ARIPiprazole (ABILIFY) 10 MG tablet Take 1 tablet (10 mg total) by mouth daily.    B Complex-C (B-COMPLEX WITH VITAMIN C) tablet Take 1 tablet by mouth daily.  (Patient not taking: Reported on 12/10/2022)    busPIRone (BUSPAR) 10 MG tablet Take 20 mg by mouth 2 (two) times daily.    divalproex (DEPAKOTE) 250 MG DR tablet Take 1 tablet (250 mg total) by mouth 2 (two) times daily. 12/10/2022: Needs refill   escitalopram (LEXAPRO) 20 MG tablet Take 1 tablet (20 mg total) by mouth daily.    ibuprofen (ADVIL) 800 MG tablet Take 1 tablet (800 mg total) by mouth every 8 (eight) hours as needed. 12/10/2022: As needed    prazosin (MINIPRESS) 5 MG capsule Take 1 capsule (5 mg total) by mouth at bedtime.    QUEtiapine (SEROQUEL) 25 MG tablet Take 25-50 mg by mouth every evening. (Patient not taking: Reported on 12/10/2022)    rosuvastatin (CRESTOR) 10 MG tablet Take 10 mg by mouth daily.    traZODone (DESYREL) 100 MG tablet Take 3 tablets (300 mg total) by mouth at bedtime. 12/10/2022: Still taken but doesn't seem to be working   No facility-administered medications prior to visit.   Allergies  Allergen Reactions   Codeine Nausea And Vomiting    Immunization History  Administered Date(s) Administered   Moderna Covid-19 Vaccine Bivalent Booster 3yrs & up 11/12/2020   Moderna Sars-Covid-2 Vaccination 08/13/2019    Health Maintenance  Topic Date Due   HIV Screening  Never done   Hepatitis C Screening  Never done   DTaP/Tdap/Td (1 - Tdap) Never done   Colonoscopy  Never done   Lung Cancer Screening  Never done   Zoster Vaccines- Shingrix (2 of 2) 10/01/2021   INFLUENZA VACCINE  09/03/2022   COVID-19 Vaccine (4 - 2023-24 season) 10/04/2022   HPV VACCINES  Aged Out    Patient Care Team: Debera Lat, PA-C as PCP - General (Physician Assistant)  Review of Systems  All other systems reviewed and are negative.  Except see HPI       Objective    BP 121/81 (BP Location: Left Arm, Patient Position: Sitting, Cuff Size: Normal)   Pulse 76   Temp 98 F (36.7 C)   Resp 20   Ht 5\' 10"  (1.778 m)   Wt 217 lb (98.4 kg)   SpO2 96%   BMI 31.14 kg/m      Physical Exam Vitals reviewed.  Constitutional:      General: He is not in acute distress.    Appearance: Normal appearance. He is not diaphoretic.  HENT:     Head: Normocephalic and atraumatic.  Eyes:     General: No scleral icterus.    Conjunctiva/sclera: Conjunctivae normal.  Cardiovascular:     Rate and Rhythm: Normal rate and regular rhythm.     Pulses: Normal pulses.  Heart sounds: Normal heart sounds. No murmur heard. Pulmonary:     Effort: Pulmonary effort is normal. No respiratory distress.     Breath sounds: Normal breath sounds. No wheezing or rhonchi.  Musculoskeletal:     Cervical back: Neck supple.     Right lower leg: No edema.     Left lower leg: No edema.  Lymphadenopathy:     Cervical: No cervical adenopathy.  Skin:    General: Skin is warm and dry.     Findings: No rash.  Neurological:     Mental Status: He is alert and oriented to person, place, and time. Mental status is at baseline.  Psychiatric:        Mood and Affect: Mood normal.        Behavior: Behavior normal.     Depression Screen    12/10/2022   10:21 AM  PHQ 2/9 Scores  PHQ - 2 Score 1  PHQ- 9 Score 8   No results found for any visits on 12/10/22.  Assessment & Plan       Depression, recurrent (HCC PTSD (post-traumatic stress disorder) Sleep problems Mental Health Reports PTSD, anxiety, and panic attacks. Currently on multiple medications for mental health. -Strongly advise to sign up with a behavioral health specialist for medication management and monitoring.  Polypharmacy Complex medication regimen primarily for mental health conditions. No recent notes from previous mental health provider. -Obtain release form to get information from previous providers for better understanding and control of medication regimen. Will be scheduling an appointment with [facility]  Seasonal allergies - Use nasal saline rinses before nose sprays such as with Neilmed Sinus  Rinse bottle.  Use distilled water.   - Use Flonase 2 sprays each nostril daily. Aim upward and outward. - Use Zyrtec 10 mg daily.   Smoking Advised link from insurance and from [facility] site Quit smoking with multiple resources for smoking cessation Advised to check  Chronic bronchitis, unspecified chronic bronchitis type (HCC) Chronic Obstructive Pulmonary Disease (COPD) Reports smoking a pack a day for many years. Currently on Spiriva. -Refer to pulmonologist for further evaluation and possible adjustment of medication. - Ambulatory referral to Pulmonology  Obesity (BMI 30-39.9) Chronic and current BMI 31+ Initial workup - CBC with Differential/Platelet - Comprehensive metabolic panel - TSH - Lipid panel - Hemoglobin A1c Will reassess after  receiving lab results   Colon cancer screening History of colon polyps and has not had a colonoscopy in several years. -Refer for colonoscopy due to history of polyps. - Ambulatory referral to gastroenterology for colonoscopy  Prostate cancer screening Low risk screening - PSA Total (Reflex To Free) ([facility] only)   General Health Maintenance -Order blood work including liver function tests and PSA. -Consider Hepatitis C and HIV screening. -Schedule follow-up appointment in one month.  Encounter to establish care Welcomed to our clinic Reviewed past medical hx, social hx, family hx and surgical hx Pt advised to send all vaccination records or screening   Return in about 4 weeks (around 01/07/2023) for chronic disease f/u.    The patient was advised to call back or seek an in-person evaluation if the symptoms worsen or if the condition fails to improve as anticipated.  I discussed the assessment and treatment plan with the patient. The patient was provided an opportunity to ask questions and all were answered. The patient agreed with the plan and demonstrated an understanding of the instructions.  I, Debera Lat, PA-C have  reviewed all documentation  for this visit. The documentation on  12/10/2022 for the exam, diagnosis, procedures, and orders are all accurate and complete.  Debera Lat, [facility], MMS [facility] [phone] (phone) [phone] (fax)  [facility]

## 2022-12-10 ENCOUNTER — Encounter: Payer: Self-pay | Admitting: Physician Assistant

## 2022-12-10 ENCOUNTER — Ambulatory Visit (INDEPENDENT_AMBULATORY_CARE_PROVIDER_SITE_OTHER): Payer: MEDICAID | Admitting: Physician Assistant

## 2022-12-10 VITALS — BP 121/81 | HR 76 | Temp 98.0°F | Resp 20 | Ht 70.0 in | Wt 217.0 lb

## 2022-12-10 DIAGNOSIS — J302 Other seasonal allergic rhinitis: Secondary | ICD-10-CM

## 2022-12-10 DIAGNOSIS — Z0000 Encounter for general adult medical examination without abnormal findings: Secondary | ICD-10-CM | POA: Diagnosis not present

## 2022-12-10 DIAGNOSIS — F17200 Nicotine dependence, unspecified, uncomplicated: Secondary | ICD-10-CM

## 2022-12-10 DIAGNOSIS — G4709 Other insomnia: Secondary | ICD-10-CM

## 2022-12-10 DIAGNOSIS — Z1159 Encounter for screening for other viral diseases: Secondary | ICD-10-CM

## 2022-12-10 DIAGNOSIS — Z1211 Encounter for screening for malignant neoplasm of colon: Secondary | ICD-10-CM

## 2022-12-10 DIAGNOSIS — J42 Unspecified chronic bronchitis: Secondary | ICD-10-CM

## 2022-12-10 DIAGNOSIS — G250 Essential tremor: Secondary | ICD-10-CM

## 2022-12-10 DIAGNOSIS — F4310 Post-traumatic stress disorder, unspecified: Secondary | ICD-10-CM | POA: Diagnosis not present

## 2022-12-10 DIAGNOSIS — E669 Obesity, unspecified: Secondary | ICD-10-CM

## 2022-12-10 DIAGNOSIS — Z79899 Other long term (current) drug therapy: Secondary | ICD-10-CM

## 2022-12-10 DIAGNOSIS — Z125 Encounter for screening for malignant neoplasm of prostate: Secondary | ICD-10-CM

## 2022-12-10 DIAGNOSIS — F339 Major depressive disorder, recurrent, unspecified: Secondary | ICD-10-CM | POA: Diagnosis not present

## 2022-12-11 ENCOUNTER — Encounter: Payer: Self-pay | Admitting: Physician Assistant

## 2022-12-11 DIAGNOSIS — F17200 Nicotine dependence, unspecified, uncomplicated: Secondary | ICD-10-CM | POA: Insufficient documentation

## 2022-12-11 DIAGNOSIS — J42 Unspecified chronic bronchitis: Secondary | ICD-10-CM | POA: Insufficient documentation

## 2022-12-11 DIAGNOSIS — G4709 Other insomnia: Secondary | ICD-10-CM | POA: Insufficient documentation

## 2022-12-11 DIAGNOSIS — F4310 Post-traumatic stress disorder, unspecified: Secondary | ICD-10-CM | POA: Insufficient documentation

## 2022-12-11 DIAGNOSIS — Z79899 Other long term (current) drug therapy: Secondary | ICD-10-CM | POA: Insufficient documentation

## 2022-12-11 DIAGNOSIS — E669 Obesity, unspecified: Secondary | ICD-10-CM | POA: Insufficient documentation

## 2022-12-11 LAB — COMPREHENSIVE METABOLIC PANEL
ALT: 20 [IU]/L (ref 0–44)
AST: 16 [IU]/L (ref 0–40)
Albumin: 4.6 g/dL (ref 3.9–4.9)
Alkaline Phosphatase: 94 [IU]/L (ref 44–121)
BUN/Creatinine Ratio: 11 (ref 10–24)
BUN: 10 mg/dL (ref 8–27)
Bilirubin Total: 0.5 mg/dL (ref 0.0–1.2)
CO2: 26 mmol/L (ref 20–29)
Calcium: 9.9 mg/dL (ref 8.6–10.2)
Chloride: 99 mmol/L (ref 96–106)
Creatinine, Ser: 0.93 mg/dL (ref 0.76–1.27)
Globulin, Total: 2.2 g/dL (ref 1.5–4.5)
Glucose: 101 mg/dL — ABNORMAL HIGH (ref 70–99)
Potassium: 4.7 mmol/L (ref 3.5–5.2)
Sodium: 139 mmol/L (ref 134–144)
Total Protein: 6.8 g/dL (ref 6.0–8.5)
eGFR: 92 mL/min/{1.73_m2} (ref >59)

## 2022-12-11 LAB — CBC WITH DIFFERENTIAL/PLATELET
Basophils Absolute: 0.1 10*3/uL (ref 0.0–0.2)
Basos: 1 %
EOS (ABSOLUTE): 0.3 10*3/uL (ref 0.0–0.4)
Eos: 4 %
Hematocrit: 50.3 % (ref 37.5–51.0)
Hemoglobin: 16.3 g/dL (ref 13.0–17.7)
Immature Grans (Abs): 0 10*3/uL (ref 0.0–0.1)
Immature Granulocytes: 0 %
Lymphocytes Absolute: 2.8 10*3/uL (ref 0.7–3.1)
Lymphs: 34 %
MCH: 30.2 pg (ref 26.6–33.0)
MCHC: 32.4 g/dL (ref 31.5–35.7)
MCV: 93 fL (ref 79–97)
Monocytes Absolute: 0.6 10*3/uL (ref 0.1–0.9)
Monocytes: 7 %
Neutrophils Absolute: 4.5 10*3/uL (ref 1.4–7.0)
Neutrophils: 54 %
Platelets: 296 10*3/uL (ref 150–450)
RBC: 5.39 x10E6/uL (ref 4.14–5.80)
RDW: 12.5 % (ref 11.6–15.4)
WBC: 8.2 10*3/uL (ref 3.4–10.8)

## 2022-12-11 LAB — LIPID PANEL
Chol/HDL Ratio: 4.7 ratio (ref 0.0–5.0)
Cholesterol, Total: 173 mg/dL (ref 100–199)
HDL: 37 mg/dL — ABNORMAL LOW (ref >39)
LDL Chol Calc (NIH): 114 mg/dL — ABNORMAL HIGH (ref 0–99)
Triglycerides: 124 mg/dL (ref 0–149)
VLDL Cholesterol Cal: 22 mg/dL (ref 5–40)

## 2022-12-11 LAB — HEMOGLOBIN A1C
Est. average glucose Bld gHb Est-mCnc: 143 mg/dL
Hgb A1c MFr Bld: 6.6 % — ABNORMAL HIGH (ref 4.8–5.6)

## 2022-12-11 LAB — PSA TOTAL (REFLEX TO FREE): Prostate Specific Ag, Serum: 1.4 ng/mL (ref 0.0–4.0)

## 2022-12-11 LAB — TSH: TSH: 2.46 u[IU]/mL (ref 0.450–4.500)

## 2022-12-14 ENCOUNTER — Telehealth: Payer: Self-pay

## 2022-12-14 ENCOUNTER — Other Ambulatory Visit: Payer: Self-pay

## 2022-12-14 DIAGNOSIS — Z1211 Encounter for screening for malignant neoplasm of colon: Secondary | ICD-10-CM

## 2022-12-14 MED ORDER — NA SULFATE-K SULFATE-MG SULF 17.5-3.13-1.6 GM/177ML PO SOLN: 1.0000 | Freq: Once | ORAL | 0 refills | Status: AC

## 2022-12-14 NOTE — Telephone Encounter (Signed)
Gastroenterology Pre-Procedure Review  Request Date: 01/18/23 Requesting Physician: Dr. Tobi Bastos  PATIENT REVIEW QUESTIONS: The patient responded to the following health history questions as indicated:    1. Are you having any GI issues? no 2. Do you have a personal history of Polyps? no 3. Do you have a family history of Colon Cancer or Polyps? no 4. Diabetes Mellitus? yes (Not taking medications for diabetes) 5. Joint replacements in the past 12 months?no 6. Major health problems in the past 3 months?no 7. Any artificial heart valves, MVP, or defibrillator?no    MEDICATIONS & ALLERGIES:    Patient reports the following regarding taking any anticoagulation/antiplatelet therapy:   Plavix, Coumadin, Eliquis, Xarelto, Lovenox, Pradaxa, Brilinta, or Effient? no Aspirin? 81 mg daily  Patient confirms/reports the following medications:  Current Outpatient Medications  Medication Sig Dispense Refill   ALPRAZolam (XANAX) 1 MG tablet Take 1 mg by mouth 3 (three) times daily.     aspirin 81 MG EC tablet Take 81 mg by mouth daily.     B Complex-C (B-COMPLEX WITH VITAMIN C) tablet Take 1 tablet by mouth daily.     busPIRone (BUSPAR) 10 MG tablet Take 20 mg by mouth 2 (two) times daily.     cariprazine (VRAYLAR) 3 MG capsule Take 1 capsule (3 mg total) by mouth daily. 30 capsule 0   Cholecalciferol (VITAMIN D3) 50 MCG (2000 UT) capsule Take 2,000 Units by mouth daily.     doxycycline (VIBRAMYCIN) 100 MG capsule Take 1 capsule (100 mg total) by mouth 2 (two) times daily. 20 capsule 0   fluticasone (FLONASE) 50 MCG/ACT nasal spray Place 2 sprays into both nostrils daily.     glucose blood ([employer] BLOOD GLUCOSE TEST) test strip Use to test glucose once daily     metoprolol succinate (TOPROL-XL) 25 MG 24 hr tablet ONE A DAY FOR SHAKINESS     omeprazole (PRILOSEC) 40 MG capsule one a day for heartburn     PROAIR HFA 108 (90 Base) MCG/ACT inhaler Inhale 2 puffs into the lungs every 6 (six) hours as  needed for wheezing or shortness of breath.     rosuvastatin (CRESTOR) 10 MG tablet Take 10 mg by mouth daily.     VIIBRYD 40 MG TABS Take 40 mg by mouth daily.     zolpidem (AMBIEN) 10 MG tablet Take 10 mg by mouth at bedtime.     ARIPiprazole (ABILIFY) 10 MG tablet Take 1 tablet (10 mg total) by mouth daily. 30 tablet 0   divalproex (DEPAKOTE) 250 MG DR tablet Take 1 tablet (250 mg total) by mouth 2 (two) times daily. 60 tablet 0   ibuprofen (ADVIL) 800 MG tablet Take 1 tablet (800 mg total) by mouth every 8 (eight) hours as needed. (Patient not taking: Reported on 12/14/2022) 30 tablet 0   paliperidone (INVEGA) 6 MG 24 hr tablet Take 6 mg by mouth every morning. (Patient not taking: Reported on 12/14/2022)     prazosin (MINIPRESS) 5 MG capsule Take 1 capsule (5 mg total) by mouth at bedtime. 30 capsule 0   promethazine (PHENERGAN) 25 MG suppository UNWRAP AND INSERT 1 SUPPOSITORY INTO RECTUM EVERY 6 HOURS TO 8 HOURS FOR NAUSEA AND VOMITNG     promethazine (PHENERGAN) 25 MG tablet TAKE 1 TABLET BY MOUTH 3 TIMES A DAY AS NEEDED FOR NAUSEA     QUEtiapine (SEROQUEL) 25 MG tablet Take 25-50 mg by mouth every evening. (Patient not taking: Reported on 12/10/2022)     sucralfate (  CARAFATE) 1 g tablet TAKE 1 TABLET 4 TIMES A DAY 1 HOUR BEFORE MEALS AND AT BEDTIME     traZODone (DESYREL) 100 MG tablet Take 3 tablets (300 mg total) by mouth at bedtime. 90 tablet 0   No current facility-administered medications for this visit.    Patient confirms/reports the following allergies:  Allergies  Allergen Reactions   Codeine Nausea And Vomiting, Other (See Comments) and Nausea Only   Metformin Other (See Comments)    No orders of the defined types were placed in this encounter.   AUTHORIZATION INFORMATION Primary Insurance: 1D#: Group #:  Secondary Insurance: 1D#: Group #:  SCHEDULE INFORMATION: Date: 01/18/23 Time: Location: [facility]

## 2022-12-17 ENCOUNTER — Encounter: Payer: Self-pay | Admitting: Pulmonary Disease

## 2022-12-21 ENCOUNTER — Telehealth: Payer: Self-pay

## 2022-12-21 ENCOUNTER — Other Ambulatory Visit: Payer: Self-pay | Admitting: Physician Assistant

## 2022-12-21 DIAGNOSIS — F339 Major depressive disorder, recurrent, unspecified: Secondary | ICD-10-CM

## 2022-12-21 DIAGNOSIS — F4310 Post-traumatic stress disorder, unspecified: Secondary | ICD-10-CM

## 2022-12-21 DIAGNOSIS — F17200 Nicotine dependence, unspecified, uncomplicated: Secondary | ICD-10-CM

## 2022-12-21 NOTE — Telephone Encounter (Signed)
Copied from CRM [id number]. Topic: Referral - Request for Referral >> Dec 18, 2022  4:04 PM Patsy Lager T wrote: Did the patient discuss referral with their provider in the last year? Yes  Appointment offered? No  Type of order/referral and detailed reason for visit: Psychiatrist, mood medication  Preference of office, provider, location: unknown  If referral order, have you been seen by this specialty before? No (If Yes, this issue or another issue? When? Where?  Can we respond through MyChart? No

## 2022-12-24 ENCOUNTER — Other Ambulatory Visit: Payer: Self-pay

## 2022-12-24 NOTE — Telephone Encounter (Signed)
Copied from CRM [id number]. Topic: General - Inquiry >> Dec 24, 2022  2:28 PM Clide Dales wrote: Duwayne Heck with [facility] is requesting a callback from nurse to discuss patient medications. [phone] ext [phone]

## 2022-12-30 MED ORDER — ARIPIPRAZOLE 10 MG PO TABS
10.0000 mg | ORAL_TABLET | Freq: Every day | ORAL | 0 refills | Status: DC
Start: 2022-12-30 — End: 2023-01-28

## 2022-12-30 MED ORDER — CARIPRAZINE HCL 3 MG PO CAPS: 3.0000 mg | ORAL_CAPSULE | Freq: Every day | ORAL | 0 refills | Status: DC

## 2022-12-30 NOTE — Telephone Encounter (Signed)
Spoke with Duwayne Heck and she is working with the patient. Reports he is needing a refill on his Abilify as he only has 7 tablets left and she does not want him to run out as well as he will need a refill on his Leafy Kindle that he is completely out.  Patient has a upcoming appt with [facility] on the 16th of December and would just like to see if we can help him until he is seen. Patient also has a follow-up appointment here in office on 01/08/23 with [place].

## 2022-12-30 NOTE — Telephone Encounter (Signed)
LVMTCB. CRM created. Ok for [facility] to advise

## 2023-01-08 ENCOUNTER — Encounter: Payer: Self-pay | Admitting: Physician Assistant

## 2023-01-08 ENCOUNTER — Ambulatory Visit (INDEPENDENT_AMBULATORY_CARE_PROVIDER_SITE_OTHER): Payer: MEDICAID | Admitting: Physician Assistant

## 2023-01-08 VITALS — BP 139/84 | HR 73 | Ht 70.0 in | Wt 227.8 lb

## 2023-01-08 DIAGNOSIS — IMO0001 Reserved for inherently not codable concepts without codable children: Secondary | ICD-10-CM

## 2023-01-08 DIAGNOSIS — Z09 Encounter for follow-up examination after completed treatment for conditions other than malignant neoplasm: Secondary | ICD-10-CM | POA: Diagnosis not present

## 2023-01-08 DIAGNOSIS — R413 Other amnesia: Secondary | ICD-10-CM | POA: Diagnosis not present

## 2023-01-08 DIAGNOSIS — J42 Unspecified chronic bronchitis: Secondary | ICD-10-CM

## 2023-01-08 DIAGNOSIS — E119 Type 2 diabetes mellitus without complications: Secondary | ICD-10-CM | POA: Diagnosis not present

## 2023-01-08 DIAGNOSIS — F17200 Nicotine dependence, unspecified, uncomplicated: Secondary | ICD-10-CM

## 2023-01-08 DIAGNOSIS — R030 Elevated blood-pressure reading, without diagnosis of hypertension: Secondary | ICD-10-CM

## 2023-01-08 DIAGNOSIS — K219 Gastro-esophageal reflux disease without esophagitis: Secondary | ICD-10-CM

## 2023-01-08 DIAGNOSIS — F339 Major depressive disorder, recurrent, unspecified: Secondary | ICD-10-CM

## 2023-01-08 DIAGNOSIS — E782 Mixed hyperlipidemia: Secondary | ICD-10-CM | POA: Diagnosis not present

## 2023-01-08 MED ORDER — ROSUVASTATIN CALCIUM 20 MG PO TABS: 20.0000 mg | ORAL_TABLET | Freq: Every day | ORAL | 1 refills | Status: DC

## 2023-01-08 NOTE — Patient Instructions (Signed)
Hi Victor Alvarez,  Your lab results from your last visit have been attached.   Please be sure to update your eye exam by contact an optometrist to schedule an appointment. This is essential to keep up to date in regards to your diabetes. Also try to check your sugar 2 to 3 times a week and keep a log. As well as I  attached information to help with your diabetes management.  Please complete your current bottle of Crestor 10 mg by taking 2 tablets daily. Upon completing your bottle your new prescription of Crestor 20 mg will be available at the pharmacy. At that point you will go back to taking 1 tablet daily.  If you would like to take a closer look at your labs your login information is listed below.  UsernameDarrow Egeler Password: [id number]  Please let us know if you have any further questions or concerns

## 2023-01-08 NOTE — Progress Notes (Unsigned)
Established patient visit  Patient: Victor Alvarez   DOB: 07-06-1960   62 y.o. Male  MRN: [id number] Visit Date: 01/08/2023  Today's healthcare provider: Debera Lat, PA-C   Chief Complaint  Patient presents with   Medical Management of Chronic Issues    4 week follow-up   Subjective    HPI HPI     Medical Management of Chronic Issues    Additional comments: 4 week follow-up        Comments   Patient provided recent lab results today during his visit. Reported he would like to try a diet for diabetes before medications and okay with increasing statin. Advised to double his current rx as he still has a bottle of 10 mg currently with him. He verbalized understanding.       Last edited by Acey Lav, CMA on 01/08/2023  9:47 AM.         01/08/2023    9:49 AM 12/10/2022   10:21 AM  PHQ9 SCORE ONLY  PHQ-9 Total Score 14 8      01/08/2023    9:49 AM 12/10/2022    1:56 PM  GAD 7 : Generalized Anxiety Score  Nervous, Anxious, on Edge 2 3  Control/stop worrying 2 2  Worry too much - different things 2 2  Trouble relaxing 2 2  Restless 2 3  Easily annoyed or irritable 1 0  Afraid - awful might happen 3 2  Total GAD 7 Score 14 14  Anxiety Difficulty Somewhat difficult Somewhat difficult   The 10-year ASCVD risk score (Arnett DK, et al., 2019) is: 35.6%   Discussed the use of AI scribe software for clinical note transcription with the patient, who gave verbal consent to proceed.  History of Present Illness          Memory      01/08/2023    9:49 AM 12/10/2022   10:21 AM  Depression screen PHQ 2/9  Decreased Interest 1 1  Down, Depressed, Hopeless 1 0  PHQ - 2 Score 2 1  Altered sleeping 1 3  Tired, decreased energy 2 1  Change in appetite 2 1  Feeling bad or failure about yourself  3 1  Trouble concentrating 1 1  Moving slowly or fidgety/restless 2 0  Suicidal thoughts 1 0  PHQ-9 Score 14 8  Difficult doing work/chores Somewhat difficult Somewhat  difficult      01/08/2023    9:49 AM 12/10/2022    1:56 PM  GAD 7 : Generalized Anxiety Score  Nervous, Anxious, on Edge 2 3  Control/stop worrying 2 2  Worry too much - different things 2 2  Trouble relaxing 2 2  Restless 2 3  Easily annoyed or irritable 1 0  Afraid - awful might happen 3 2  Total GAD 7 Score 14 14  Anxiety Difficulty Somewhat difficult Somewhat difficult    Medications: Outpatient Medications Prior to Visit  Medication Sig   ALPRAZolam (XANAX) 1 MG tablet Take 1 mg by mouth 3 (three) times daily.   ARIPiprazole (ABILIFY) 10 MG tablet Take 1 tablet (10 mg total) by mouth daily.   aspirin 81 MG EC tablet Take 81 mg by mouth daily.   B Complex-C (B-COMPLEX WITH VITAMIN C) tablet Take 1 tablet by mouth daily.   busPIRone (BUSPAR) 10 MG tablet Take 20 mg by mouth 2 (two) times daily.   cariprazine (VRAYLAR) 3 MG capsule Take 1 capsule (3 mg total) by mouth daily.   Cholecalciferol (VITAMIN  D3) 50 MCG (2000 UT) capsule Take 2,000 Units by mouth daily.   doxycycline (VIBRAMYCIN) 100 MG capsule Take 1 capsule (100 mg total) by mouth 2 (two) times daily.   fluticasone (FLONASE) 50 MCG/ACT nasal spray Place 2 sprays into both nostrils daily.   glucose blood ([employer] BLOOD GLUCOSE TEST) test strip Use to test glucose once daily   ibuprofen (ADVIL) 800 MG tablet Take 1 tablet (800 mg total) by mouth every 8 (eight) hours as needed.   metoprolol succinate (TOPROL-XL) 25 MG 24 hr tablet ONE A DAY FOR SHAKINESS   omeprazole (PRILOSEC) 40 MG capsule one a day for heartburn   paliperidone (INVEGA) 6 MG 24 hr tablet Take 6 mg by mouth every morning.   PROAIR HFA 108 (90 Base) MCG/ACT inhaler Inhale 2 puffs into the lungs every 6 (six) hours as needed for wheezing or shortness of breath.   promethazine (PHENERGAN) 25 MG suppository UNWRAP AND INSERT 1 SUPPOSITORY INTO RECTUM EVERY 6 HOURS TO 8 HOURS FOR NAUSEA AND VOMITNG   promethazine (PHENERGAN) 25 MG tablet TAKE 1 TABLET BY  MOUTH 3 TIMES A DAY AS NEEDED FOR NAUSEA   QUEtiapine (SEROQUEL) 25 MG tablet Take 25-50 mg by mouth every evening.   rosuvastatin (CRESTOR) 10 MG tablet Take 10 mg by mouth daily.   sucralfate (CARAFATE) 1 g tablet TAKE 1 TABLET 4 TIMES A DAY 1 HOUR BEFORE MEALS AND AT BEDTIME   VIIBRYD 40 MG TABS Take 40 mg by mouth daily.   zolpidem (AMBIEN) 10 MG tablet Take 10 mg by mouth at bedtime.   divalproex (DEPAKOTE) 250 MG DR tablet Take 1 tablet (250 mg total) by mouth 2 (two) times daily.   prazosin (MINIPRESS) 5 MG capsule Take 1 capsule (5 mg total) by mouth at bedtime.   traZODone (DESYREL) 100 MG tablet Take 3 tablets (300 mg total) by mouth at bedtime.   No facility-administered medications prior to visit.    Review of Systems  All other systems reviewed and are negative.  Except see HPI   {Insert previous labs (optional):23779} {See past labs  Heme  Chem  Endocrine  Serology  Results Review (optional):1}   Objective    BP 139/84 (BP Location: Left Arm, Patient Position: Sitting, Cuff Size: Normal)   Pulse 73   Ht 5\' 10"  (1.778 m)   Wt 227 lb 12.8 oz (103.3 kg)   BMI 32.69 kg/m  {Insert last BP/Wt (optional):23777}{See vitals history (optional):1}   Physical Exam Vitals reviewed.  Constitutional:      General: He is not in acute distress.    Appearance: Normal appearance. He is not diaphoretic.  HENT:     Head: Normocephalic and atraumatic.  Eyes:     General: No scleral icterus.    Conjunctiva/sclera: Conjunctivae normal.  Cardiovascular:     Rate and Rhythm: Normal rate and regular rhythm.     Pulses: Normal pulses.     Heart sounds: Normal heart sounds. No murmur heard. Pulmonary:     Effort: Pulmonary effort is normal. No respiratory distress.     Breath sounds: Normal breath sounds. No wheezing or rhonchi.  Musculoskeletal:     Cervical back: Neck supple.     Right lower leg: No edema.     Left lower leg: No edema.  Lymphadenopathy:     Cervical: No  cervical adenopathy.  Skin:    General: Skin is warm and dry.     Findings: No rash.  Neurological:  Mental Status: He is alert and oriented to person, place, and time. Mental status is at baseline.  Psychiatric:        Mood and Affect: Mood normal.        Behavior: Behavior normal.      No results found for any visits on 01/08/23.  Assessment & Plan    *** Assessment and Plan              No follow-ups on file.    The patient was advised to call back or seek an in-person evaluation if the symptoms worsen or if the condition fails to improve as anticipated.  I discussed the assessment and treatment plan with the patient. The patient was provided an opportunity to ask questions and all were answered. The patient agreed with the plan and demonstrated an understanding of the instructions.  I, Debera Lat, PA-C have reviewed all documentation for this visit. The documentation on  01/28/23 for the exam, diagnosis, procedures, and orders are all accurate and complete.  Debera Lat, [facility], MMS [facility] [phone] (phone) [phone] (fax)   [facility]

## 2023-01-09 DIAGNOSIS — F339 Major depressive disorder, recurrent, unspecified: Secondary | ICD-10-CM | POA: Insufficient documentation

## 2023-01-09 LAB — MICROALBUMIN / CREATININE URINE RATIO
Creatinine, Urine: 138 mg/dL
Microalb/Creat Ratio: 11 mg/g{creat} (ref 0–29)
Microalbumin, Urine: 15.1 ug/mL

## 2023-01-11 ENCOUNTER — Telehealth: Payer: Self-pay

## 2023-01-11 NOTE — Telephone Encounter (Signed)
Patient called in left a voicemail requesting his time for his procedure. I called the patient back to inform her we receive her message, and I inform him that he will have to call endo to get his time. I gave the patient the number and let him know that he will have to call on 01/15/23 because his procedure is on a Monday.

## 2023-01-15 ENCOUNTER — Telehealth: Payer: Self-pay | Admitting: Physician Assistant

## 2023-01-15 ENCOUNTER — Encounter: Payer: Self-pay | Admitting: Gastroenterology

## 2023-01-15 NOTE — Telephone Encounter (Signed)
Per notes OV 12/10/22: Chronic bronchitis, unspecified chronic bronchitis type (HCC) Chronic Obstructive Pulmonary Disease (COPD) Reports smoking a pack a day for many years. Currently on Spiriva. -Refer to pulmonologist for further evaluation and possible adjustment of medication. - Ambulatory referral to Pulmonology  Medication is not on current medication list- or found in history- will need new Rx if to continue

## 2023-01-15 NOTE — Telephone Encounter (Signed)
Patient stated he needs his Spireva inhaler refilled but med was not found on medication list. He would like for it to be sent to  [id number], [place] - [address] Phone: [phone]  Fax: [phone]

## 2023-01-18 ENCOUNTER — Ambulatory Visit: Payer: MEDICAID | Admitting: Anesthesiology

## 2023-01-18 ENCOUNTER — Encounter: Payer: Self-pay | Admitting: Physician Assistant

## 2023-01-18 ENCOUNTER — Ambulatory Visit
Admission: RE | Admit: 2023-01-18 | Discharge: 2023-01-18 | Disposition: A | Discharge status: Home / Self Care | Payer: MEDICAID | Attending: Gastroenterology | Admitting: Gastroenterology

## 2023-01-18 ENCOUNTER — Encounter
Admission: RE | Disposition: A | Discharge status: Home / Self Care | Payer: Self-pay | Source: Home / Self Care | Attending: Gastroenterology

## 2023-01-18 ENCOUNTER — Other Ambulatory Visit: Payer: Self-pay

## 2023-01-18 DIAGNOSIS — Z1211 Encounter for screening for malignant neoplasm of colon: Secondary | ICD-10-CM | POA: Diagnosis present

## 2023-01-18 DIAGNOSIS — D490 Neoplasm of unspecified behavior of digestive system: Secondary | ICD-10-CM | POA: Insufficient documentation

## 2023-01-18 DIAGNOSIS — A630 Anogenital (venereal) warts: Secondary | ICD-10-CM

## 2023-01-18 DIAGNOSIS — K219 Gastro-esophageal reflux disease without esophagitis: Secondary | ICD-10-CM | POA: Insufficient documentation

## 2023-01-18 DIAGNOSIS — J449 Chronic obstructive pulmonary disease, unspecified: Secondary | ICD-10-CM | POA: Diagnosis not present

## 2023-01-18 DIAGNOSIS — D120 Benign neoplasm of cecum: Secondary | ICD-10-CM | POA: Diagnosis not present

## 2023-01-18 DIAGNOSIS — D122 Benign neoplasm of ascending colon: Secondary | ICD-10-CM | POA: Diagnosis not present

## 2023-01-18 DIAGNOSIS — K629 Disease of anus and rectum, unspecified: Secondary | ICD-10-CM | POA: Diagnosis not present

## 2023-01-18 DIAGNOSIS — F319 Bipolar disorder, unspecified: Secondary | ICD-10-CM | POA: Insufficient documentation

## 2023-01-18 DIAGNOSIS — F419 Anxiety disorder, unspecified: Secondary | ICD-10-CM | POA: Insufficient documentation

## 2023-01-18 DIAGNOSIS — E119 Type 2 diabetes mellitus without complications: Secondary | ICD-10-CM | POA: Insufficient documentation

## 2023-01-18 DIAGNOSIS — D126 Benign neoplasm of colon, unspecified: Secondary | ICD-10-CM

## 2023-01-18 DIAGNOSIS — F17210 Nicotine dependence, cigarettes, uncomplicated: Secondary | ICD-10-CM | POA: Diagnosis not present

## 2023-01-18 HISTORY — PX: COLONOSCOPY WITH PROPOFOL: SHX5780

## 2023-01-18 HISTORY — PX: POLYPECTOMY: SHX5525

## 2023-01-18 HISTORY — PX: HEMOSTASIS CLIP PLACEMENT: SHX6857

## 2023-01-18 LAB — GLUCOSE, CAPILLARY: Glucose-Capillary: 110 mg/dL — ABNORMAL HIGH (ref 70–99)

## 2023-01-18 SURGERY — COLONOSCOPY WITH PROPOFOL
Anesthesia: General

## 2023-01-18 MED ORDER — SODIUM CHLORIDE 0.9 % IV SOLN
INTRAVENOUS | Status: DC
  Administered 2023-01-18: 20 mL/h via INTRAVENOUS

## 2023-01-18 MED ORDER — PROPOFOL 10 MG/ML IV BOLUS
INTRAVENOUS | Status: DC | PRN
  Administered 2023-01-18: 100 mg via INTRAVENOUS
  Administered 2023-01-18: 100 ug/kg/min via INTRAVENOUS

## 2023-01-18 MED ORDER — LIDOCAINE HCL (PF) 2 % IJ SOLN
INTRAMUSCULAR | Status: AC
  Filled 2023-01-18: qty 5

## 2023-01-18 MED ORDER — PROPOFOL 10 MG/ML IV BOLUS
INTRAVENOUS | Status: AC
  Filled 2023-01-18: qty 40

## 2023-01-18 MED ORDER — LIDOCAINE HCL (CARDIAC) PF 100 MG/5ML IV SOSY
PREFILLED_SYRINGE | INTRAVENOUS | Status: DC | PRN
  Administered 2023-01-18: 100 mg via INTRAVENOUS

## 2023-01-18 MED ORDER — STERILE WATER FOR IRRIGATION IR SOLN
Status: DC | PRN
  Administered 2023-01-18: 100 mL

## 2023-01-18 MED ORDER — PHENYLEPHRINE HCL-NACL 20-0.9 MG/250ML-% IV SOLN
INTRAVENOUS | Status: AC
  Filled 2023-01-18: qty 250

## 2023-01-18 NOTE — Transfer of Care (Signed)
Immediate Anesthesia Transfer of Care Note  Patient: Victor Alvarez  Procedure(s) Performed: COLONOSCOPY WITH PROPOFOL POLYPECTOMY HEMOSTASIS CLIP PLACEMENT  Patient Location: Endoscopy Unit  Anesthesia Type:General  Level of Consciousness: awake, alert , and drowsy  Airway & Oxygen Therapy: Patient Spontanous Breathing  Post-op Assessment: Report given to RN and Post -op Vital signs reviewed and stable  Post vital signs: Reviewed and stable  Last Vitals:  Vitals Value Taken Time  BP 136/101 0829  Temp 36.1 0829  Pulse 74 0829  Resp 19 0829  SpO2 98 0829    Last Pain:  Vitals:   01/18/23 0722  TempSrc: Temporal  PainSc: 0-No pain         Complications: No notable events documented.

## 2023-01-18 NOTE — H&P (Signed)
Victor Mood, MD [address], [address], [place], [place], [address] [address], [address], [place], [place], [address] Phone: [phone]  Fax: [phone]  Primary Care Physician:  Debera Lat, PA-C   Pre-Procedure History & Physical: HPI:  Victor Alvarez is a 62 y.o. male is here for an colonoscopy.   Past Medical History:  Diagnosis Date   Allergy    Arthritis    COPD (chronic obstructive pulmonary disease) (HCC)    Depression    Neuromuscular disorder (HCC)    PTSD (post-traumatic stress disorder)    Sleep apnea     Past Surgical History:  Procedure Laterality Date   WRIST SURGERY     XI ROBOTIC LAPAROSCOPIC ASSISTED APPENDECTOMY N/A 08/24/2019   Procedure: XI ROBOTIC LAPAROSCOPIC ASSISTED APPENDECTOMY;  Surgeon: Campbell Lerner, MD;  Location: [facility] ORS;  Service: General;  Laterality: N/A;    Prior to Admission medications   Medication Sig Start Date End Date Taking? Authorizing Provider  ALPRAZolam Prudy Feeler) 1 MG tablet Take 1 mg by mouth 3 (three) times daily. 06/29/19  Yes [provider]  ARIPiprazole (ABILIFY) 10 MG tablet Take 1 tablet (10 mg total) by mouth daily. 12/30/22 01/29/23 Yes Jacky Kindle, FNP  aspirin 81 MG EC tablet Take 81 mg by mouth daily. 02/24/17  Yes [provider]  B Complex-C (B-COMPLEX WITH VITAMIN C) tablet Take 1 tablet by mouth daily.   Yes [provider]  busPIRone (BUSPAR) 10 MG tablet Take 20 mg by mouth 2 (two) times daily. 08/13/19  Yes [provider]  cariprazine (VRAYLAR) 3 MG capsule Take 1 capsule (3 mg total) by mouth daily. 12/30/22  Yes Merita Norton T, FNP  Cholecalciferol (VITAMIN D3) 50 MCG (2000 UT) capsule Take 2,000 Units by mouth daily. 10/30/22  Yes [provider]  doxycycline (VIBRAMYCIN) 100 MG capsule Take 1 capsule (100 mg total) by mouth 2 (two) times daily. 07/14/21  Yes Sharyn Creamer, MD  fluticasone (FLONASE) 50 MCG/ACT nasal spray Place 2 sprays into both  nostrils daily. 04/08/18  Yes [provider]  glucose blood ([employer] BLOOD GLUCOSE TEST) test strip Use to test glucose once daily 04/03/20  Yes [provider]  ibuprofen (ADVIL) 800 MG tablet Take 1 tablet (800 mg total) by mouth every 8 (eight) hours as needed. 08/25/19  Yes Lynden [place] R, PA-C  metoprolol succinate (TOPROL-XL) 25 MG 24 hr tablet ONE A DAY FOR SHAKINESS 11/20/22  Yes [provider]  omeprazole (PRILOSEC) 40 MG capsule one a day for heartburn 11/20/22  Yes [provider]  paliperidone (INVEGA) 6 MG 24 hr tablet Take 6 mg by mouth every morning. 07/26/19  Yes [provider]  PROAIR HFA 108 (90 Base) MCG/ACT inhaler Inhale 2 puffs into the lungs every 6 (six) hours as needed for wheezing or shortness of breath. 07/31/19  Yes [provider]  promethazine (PHENERGAN) 25 MG suppository UNWRAP AND INSERT 1 SUPPOSITORY INTO RECTUM EVERY 6 HOURS TO 8 HOURS FOR NAUSEA AND VOMITNG   Yes [provider]  promethazine (PHENERGAN) 25 MG tablet TAKE 1 TABLET BY MOUTH 3 TIMES A DAY AS NEEDED FOR NAUSEA   Yes [provider]  QUEtiapine (SEROQUEL) 25 MG tablet Take 25-50 mg by mouth every evening. 07/26/19  Yes [provider]  rosuvastatin (CRESTOR) 20 MG tablet Take 1 tablet (20 mg total) by mouth daily. 01/08/23  Yes Ostwalt, Edmon Crape, PA-C  sucralfate (CARAFATE) 1 g tablet TAKE 1 TABLET  4 TIMES A DAY 1 HOUR BEFORE MEALS AND AT BEDTIME   Yes [provider]  VIIBRYD 40 MG TABS Take 40 mg by mouth daily. 05/23/19  Yes [provider]  zolpidem (AMBIEN) 10 MG tablet Take 10 mg by mouth at bedtime. 08/07/19  Yes [provider]  divalproex (DEPAKOTE) 250 MG DR tablet Take 1 tablet (250 mg total) by mouth 2 (two) times daily. 10/30/22 11/29/22  Delorse Lek, FNP  prazosin (MINIPRESS) 5 MG capsule Take 1 capsule (5 mg total) by mouth at bedtime. 10/30/22 11/29/22  Delorse Lek, FNP  traZODone  (DESYREL) 100 MG tablet Take 3 tablets (300 mg total) by mouth at bedtime. 10/30/22 11/29/22  Delorse Lek, FNP    Allergies as of 12/14/2022 - Review Complete 12/14/2022  Allergen Reaction Noted   Codeine Nausea And Vomiting, Other (See Comments), and Nausea Only 06/15/2014   Metformin Other (See Comments) 12/14/2022    No family history on file.  Social History   Socioeconomic History   Marital status: Single    Spouse name: Not on file   Number of children: Not on file   Years of education: Not on file   Highest education level: Not on file  Occupational History   Not on file  Tobacco Use   Smoking status: Every Day    Types: Cigarettes   Smokeless tobacco: Never  Vaping Use   Vaping status: Never Used  Substance and Sexual Activity   Alcohol use: Not Currently   Drug use: Not Currently   Sexual activity: Not on file  Other Topics Concern   Not on file  Social History Narrative   Not on file   Social Drivers of Health   Financial Resource Strain: Not on file  Food Insecurity: Not on file  Transportation Needs: Not on file  Physical Activity: Not on file  Stress: Not on file  Social Connections: Not on file  Intimate Partner Violence: Not on file    Review of Systems: See HPI, otherwise negative ROS  Physical Exam: BP 134/82   Pulse 83   Resp 20   Ht 5\' 10"  (1.778 m)   Wt 103.1 kg   SpO2 97%   BMI 32.60 kg/m  General:   Alert,  pleasant and cooperative in NAD Head:  Normocephalic and atraumatic. Neck:  Supple; no masses or thyromegaly. Lungs:  Clear throughout to auscultation, normal respiratory effort.    Heart:  +S1, +S2, Regular rate and rhythm, No edema. Abdomen:  Soft, nontender and nondistended. Normal bowel sounds, without guarding, and without rebound.   Neurologic:  Alert and  oriented x4;  grossly normal neurologically.  Impression/Plan: [name] is here for an colonoscopy to be performed for Screening colonoscopy average risk    Risks, benefits, limitations, and alternatives regarding  colonoscopy have been reviewed with the patient.  Questions have been answered.  All parties agreeable.   Victor Mood, MD  01/18/2023, 8:02 AM

## 2023-01-18 NOTE — Anesthesia Preprocedure Evaluation (Signed)
Anesthesia Evaluation  Patient identified by MRN, date of birth, ID band Patient awake    Reviewed: Allergy & Precautions, NPO status , Patient's Chart, lab work & pertinent test results  History of Anesthesia Complications Negative for: history of anesthetic complications  Airway Mallampati: II  TM Distance: >3 FB Neck ROM: Full    Dental  (+) Poor Dentition, Chipped   Pulmonary neg sleep apnea, COPD,  COPD inhaler, Current SmokerPatient did not abstain from smoking. OSA listed on chart but patient denies   Pulmonary exam normal breath sounds clear to auscultation       Cardiovascular Exercise Tolerance: Good METS(-) hypertension(-) CAD and (-) Past MI negative cardio ROS (-) dysrhythmias  Rhythm:Regular Rate:Normal - Systolic murmurs    Neuro/Psych  PSYCHIATRIC DISORDERS Anxiety Depression Bipolar Disorder   negative neurological ROS     GI/Hepatic ,GERD  ,,(+)     (-) substance abuse    Endo/Other  diabetes, Well Controlled    Renal/GU negative Renal ROS     Musculoskeletal   Abdominal   Peds  Hematology   Anesthesia Other Findings Past Medical History: No date: Allergy No date: Arthritis No date: COPD (chronic obstructive pulmonary disease) (HCC) No date: Depression No date: Neuromuscular disorder (HCC) No date: PTSD (post-traumatic stress disorder) No date: Sleep apnea  Reproductive/Obstetrics                             Anesthesia Physical Anesthesia Plan  ASA: 2  Anesthesia Plan: General   Post-op Pain Management: Minimal or no pain anticipated   Induction: Intravenous  PONV Risk Score and Plan: 1 and Propofol infusion, TIVA and Ondansetron  Airway Management Planned: Nasal Cannula  Additional Equipment: None  Intra-op Plan:   Post-operative Plan:   Informed Consent: I have reviewed the patients History and Physical, chart, labs and discussed the procedure  including the risks, benefits and alternatives for the proposed anesthesia with the patient or authorized representative who has indicated his/her understanding and acceptance.     Dental advisory given  Plan Discussed with: CRNA and Surgeon  Anesthesia Plan Comments: (Discussed risks of anesthesia with patient, including possibility of difficulty with spontaneous ventilation under anesthesia necessitating airway intervention, PONV, and rare risks such as cardiac or respiratory or neurological events, and allergic reactions. Discussed the role of CRNA in patient's perioperative care. Patient understands. Patient counseled on benefits of smoking cessation, and increased perioperative risks associated with continued smoking. )       Anesthesia Quick Evaluation

## 2023-01-18 NOTE — Op Note (Signed)
[facility] Patient Name: Victor Alvarez Procedure Date: 01/18/2023 7:17 AM MRN: [id number] Account #: [id number] Date of Birth: Oct 17, 1960 Admit Type: Outpatient Age: 62 Room: [facility] ENDO ROOM 1 Gender: Male Note Status: Finalized Instrument Name: Prentice Docker [id number] Procedure:             Colonoscopy Indications:           Screening for colorectal malignant neoplasm Providers:             Wyline Mood MD, MD Referring MD:          Wyline Mood MD, MD (Referring MD), Debera Lat                         (Referring MD) Medicines:             Monitored Anesthesia Care Complications:         No immediate complications. Procedure:             Pre-Anesthesia Assessment:                        - Prior to the procedure, a History and Physical was                         performed, and patient medications, allergies and                         sensitivities were reviewed. The patient's tolerance                         of previous anesthesia was reviewed.                        - The risks and benefits of the procedure and the                         sedation options and risks were discussed with the                         patient. All questions were answered and informed                         consent was obtained.                        - ASA Grade Assessment: II - A patient with mild                         systemic disease.                        After obtaining informed consent, the colonoscope was                         passed under direct vision. Throughout the procedure,                         the patient's blood pressure, pulse, and oxygen                         saturations were monitored  continuously. The                         Colonoscope was introduced through the anus and                         advanced to the the cecum, identified by the                         appendiceal orifice. The colonoscopy was performed                         with  ease. The patient tolerated the procedure well.                         The quality of the bowel preparation was excellent.                         The ileocecal valve, appendiceal orifice, and rectum                         were photographed. Findings:      The perianal and digital rectal examinations were normal.      Seven sessile polyps were found in the ascending colon. The polyps were       5 to 6 mm in size. These polyps were removed with a cold snare.       Resection and retrieval were complete.      An 8 mm polyp was found in the cecum. The polyp was sessile. The polyp       was removed with a cold snare. Resection and retrieval were complete. To       prevent bleeding post-intervention, one hemostatic clip was successfully       placed. Clip manufacturer: [employer]. There was no bleeding at       the end of the procedure.      A 12 mm polypoid lesion was found at the anus and in the distal rectum.       The lesion was sessile. No bleeding was present.      The exam was otherwise without abnormality on direct and retroflexion       views. Impression:            - Seven 5 to 6 mm polyps in the ascending colon,                         removed with a cold snare. Resected and retrieved.                        - One 8 mm polyp in the cecum, removed with a cold                         snare. Resected and retrieved. Clip was placed. Clip                         manufacturer: [employer].                        - Polypoid lesion at the anus and in the distal rectum.                        -  The examination was otherwise normal on direct and                         retroflexion views. Recommendation:        - Discharge patient to home (with escort).                        - Resume previous diet.                        - Continue present medications.                        - Await pathology results.                        - Repeat colonoscopy date to be determined after                          pending pathology results are reviewed for                         surveillance after piecemeal polypectomy.                        - Refer to Dr Aleen Campi for resection of lesion vs                         condyloma on anal canal extending to distal rectum Procedure Code(s):     --- Professional ---                        [phone], Colonoscopy, flexible; with removal of                         tumor(s), polyp(s), or other lesion(s) by snare                         technique Diagnosis Code(s):     --- Professional ---                        Z12.11, Encounter for screening for malignant neoplasm                         of colon                        D12.2, Benign neoplasm of ascending colon                        D12.0, Benign neoplasm of cecum                        D49.0, Neoplasm of unspecified behavior of digestive                         system CPT copyright 2022 American [employer] Association. All rights reserved. The codes documented in this report are preliminary and upon coder review may  be revised to meet current compliance requirements. Wyline Mood, MD Wyline Mood MD, MD 01/18/2023 8:28:41 AM This report has been  signed electronically. Number of Addenda: 0 Note Initiated On: 01/18/2023 7:17 AM Scope Withdrawal Time: 0 hours 12 minutes 54 seconds  Total Procedure Duration: 0 hours 17 minutes 40 seconds  Estimated Blood Loss:  Estimated blood loss: none.      [facility]

## 2023-01-18 NOTE — Anesthesia Postprocedure Evaluation (Signed)
Anesthesia Post Note  Patient: Victor Alvarez  Procedure(s) Performed: COLONOSCOPY WITH PROPOFOL POLYPECTOMY HEMOSTASIS CLIP PLACEMENT  Patient location during evaluation: Endoscopy Anesthesia Type: General Level of consciousness: awake and alert Pain management: pain level controlled Vital Signs Assessment: post-procedure vital signs reviewed and stable Respiratory status: spontaneous breathing, nonlabored ventilation, respiratory function stable and patient connected to nasal cannula oxygen Cardiovascular status: blood pressure returned to baseline and stable Postop Assessment: no apparent nausea or vomiting Anesthetic complications: no   No notable events documented.   Last Vitals:  Vitals:   01/18/23 0722 01/18/23 0830  BP: 134/82 (!) 88/52  Pulse: 83   Resp: 20 16  SpO2: 97% 94%    Last Pain:  Vitals:   01/18/23 0830  TempSrc:   PainSc: Asleep                 Corinda Gubler

## 2023-01-19 ENCOUNTER — Encounter: Payer: Self-pay | Admitting: Surgery

## 2023-01-19 ENCOUNTER — Encounter: Payer: Self-pay | Admitting: Student in an Organized Health Care Education/Training Program

## 2023-01-19 ENCOUNTER — Ambulatory Visit: Payer: MEDICAID | Admitting: Student in an Organized Health Care Education/Training Program

## 2023-01-19 ENCOUNTER — Ambulatory Visit (INDEPENDENT_AMBULATORY_CARE_PROVIDER_SITE_OTHER): Payer: MEDICAID | Admitting: Surgery

## 2023-01-19 VITALS — BP 96/60 | HR 66 | Temp 97.6°F | Ht 70.0 in | Wt 229.8 lb

## 2023-01-19 VITALS — BP 132/71 | HR 73 | Temp 97.8°F | Ht 70.0 in | Wt 227.0 lb

## 2023-01-19 DIAGNOSIS — R0602 Shortness of breath: Secondary | ICD-10-CM

## 2023-01-19 DIAGNOSIS — K620 Anal polyp: Secondary | ICD-10-CM

## 2023-01-19 DIAGNOSIS — K641 Second degree hemorrhoids: Secondary | ICD-10-CM

## 2023-01-19 LAB — NITRIC OXIDE: Nitric Oxide: 10

## 2023-01-19 LAB — SURGICAL PATHOLOGY

## 2023-01-19 MED ORDER — STIOLTO RESPIMAT 2.5-2.5 MCG/ACT IN AERS: 2.0000 | INHALATION_SPRAY | Freq: Every day | RESPIRATORY_TRACT | 12 refills | Status: DC

## 2023-01-19 NOTE — Progress Notes (Unsigned)
Synopsis: Referred in *** by Debera Lat, PA-C  Assessment & Plan:   1. Shortness of breath (Primary)  Short of breath with exertion, some chest tightness, wheeze. Cough productive of sputum. Active smoker, 1 pack/day, 50 pack years. Jimmye Norman, rule out pneumoconiosis, rule out COPD. Was on spiriva, ran out and feels worse. Will initiate stiolto.   - Nitric oxide - Tiotropium Bromide-Olodaterol (STIOLTO RESPIMAT) 2.5-2.5 MCG/ACT AERS; Inhale 2 puffs into the lungs daily.  Dispense: 60 each; Refill: 12 - Pulmonary Function Test [facility] Only; Future - CT CHEST HIGH RESOLUTION; Future   Return in about 3 months (around 04/19/2023).  I spent *** minutes caring for this patient today, including {EM billing:[id number]}  Raechel Chute, MD [facility] 01/19/2023 10:52 AM    End of visit medications:  Meds ordered this encounter  Medications   Tiotropium Bromide-Olodaterol (STIOLTO RESPIMAT) 2.5-2.5 MCG/ACT AERS    Sig: Inhale 2 puffs into the lungs daily.    Dispense:  60 each    Refill:  12     Current Outpatient Medications:    Tiotropium Bromide-Olodaterol (STIOLTO RESPIMAT) 2.5-2.5 MCG/ACT AERS, Inhale 2 puffs into the lungs daily., Disp: 60 each, Rfl: 12   ALPRAZolam (XANAX) 1 MG tablet, Take 1 mg by mouth 3 (three) times daily., Disp: , Rfl:    ARIPiprazole (ABILIFY) 10 MG tablet, Take 1 tablet (10 mg total) by mouth daily., Disp: 30 tablet, Rfl: 0   aspirin 81 MG EC tablet, Take 81 mg by mouth daily., Disp: , Rfl:    B Complex-C (B-COMPLEX WITH VITAMIN C) tablet, Take 1 tablet by mouth daily., Disp: , Rfl:    busPIRone (BUSPAR) 10 MG tablet, Take 20 mg by mouth 2 (two) times daily., Disp: , Rfl:    cariprazine (VRAYLAR) 3 MG capsule, Take 1 capsule (3 mg total) by mouth daily., Disp: 30 capsule, Rfl: 0   Cholecalciferol (VITAMIN D3) 50 MCG (2000 UT) capsule, Take 2,000 Units by mouth daily., Disp: , Rfl:    divalproex (DEPAKOTE) 250 MG DR tablet, Take 1 tablet  (250 mg total) by mouth 2 (two) times daily., Disp: 60 tablet, Rfl: 0   doxycycline (VIBRAMYCIN) 100 MG capsule, Take 1 capsule (100 mg total) by mouth 2 (two) times daily., Disp: 20 capsule, Rfl: 0   fluticasone (FLONASE) 50 MCG/ACT nasal spray, Place 2 sprays into both nostrils daily., Disp: , Rfl:    glucose blood ([employer] BLOOD GLUCOSE TEST) test strip, Use to test glucose once daily, Disp: , Rfl:    ibuprofen (ADVIL) 800 MG tablet, Take 1 tablet (800 mg total) by mouth every 8 (eight) hours as needed., Disp: 30 tablet, Rfl: 0   metoprolol succinate (TOPROL-XL) 25 MG 24 hr tablet, ONE A DAY FOR SHAKINESS, Disp: , Rfl:    omeprazole (PRILOSEC) 40 MG capsule, one a day for heartburn, Disp: , Rfl:    paliperidone (INVEGA) 6 MG 24 hr tablet, Take 6 mg by mouth every morning., Disp: , Rfl:    prazosin (MINIPRESS) 5 MG capsule, Take 1 capsule (5 mg total) by mouth at bedtime., Disp: 30 capsule, Rfl: 0   PROAIR HFA 108 (90 Base) MCG/ACT inhaler, Inhale 2 puffs into the lungs every 6 (six) hours as needed for wheezing or shortness of breath., Disp: , Rfl:    promethazine (PHENERGAN) 25 MG suppository, UNWRAP AND INSERT 1 SUPPOSITORY INTO RECTUM EVERY 6 HOURS TO 8 HOURS FOR NAUSEA AND VOMITNG, Disp: , Rfl:    promethazine (PHENERGAN)  25 MG tablet, TAKE 1 TABLET BY MOUTH 3 TIMES A DAY AS NEEDED FOR NAUSEA, Disp: , Rfl:    QUEtiapine (SEROQUEL) 25 MG tablet, Take 25-50 mg by mouth every evening., Disp: , Rfl:    rosuvastatin (CRESTOR) 20 MG tablet, Take 1 tablet (20 mg total) by mouth daily., Disp: 90 tablet, Rfl: 1   sucralfate (CARAFATE) 1 g tablet, TAKE 1 TABLET 4 TIMES A DAY 1 HOUR BEFORE MEALS AND AT BEDTIME, Disp: , Rfl:    traZODone (DESYREL) 100 MG tablet, Take 3 tablets (300 mg total) by mouth at bedtime., Disp: 90 tablet, Rfl: 0   VIIBRYD 40 MG TABS, Take 40 mg by mouth daily., Disp: , Rfl:    zolpidem (AMBIEN) 10 MG tablet, Take 10 mg by mouth at bedtime., Disp: , Rfl:    Subjective:    PATIENT ID: Victor Alvarez GENDER: male DOB: [birth year], MRN: [id number]  Chief Complaint  Patient presents with   Consult    Cough, shortness of breath on exertion and at rest. Wheezing. Patient ran out of Spiriva a week ago.     HPI ***  Ancillary information including prior medications, full medical/surgical/family/social histories, and PFTs (when available) are listed below and have been reviewed.   ROS   Objective:   Vitals:   01/19/23 1032  BP: 96/60  Pulse: 66  Temp: 97.6 F (36.4 C)  TempSrc: Temporal  SpO2: 95%  Weight: 229 lb 12.8 oz (104.2 kg)  Height: 5\' 10"  (1.778 m)   95% on *** LPM *** RA BMI Readings from Last 3 Encounters:  01/19/23 32.97 kg/m  01/18/23 32.60 kg/m  01/08/23 32.69 kg/m   Wt Readings from Last 3 Encounters:  01/19/23 229 lb 12.8 oz (104.2 kg)  01/18/23 227 lb 3.2 oz (103.1 kg)  01/08/23 227 lb 12.8 oz (103.3 kg)    Physical Exam    Ancillary Information    Past Medical History:  Diagnosis Date   Allergy    Arthritis    COPD (chronic obstructive pulmonary disease) (HCC)    Depression    Neuromuscular disorder (HCC)    PTSD (post-traumatic stress disorder)    Sleep apnea      No family history on file.   Past Surgical History:  Procedure Laterality Date   WRIST SURGERY     XI ROBOTIC LAPAROSCOPIC ASSISTED APPENDECTOMY N/A 08/24/2019   Procedure: XI ROBOTIC LAPAROSCOPIC ASSISTED APPENDECTOMY;  Surgeon: Campbell Lerner, MD;  Location: [facility] ORS;  Service: General;  Laterality: N/A;    Social History   Socioeconomic History   Marital status: Single    Spouse name: Not on file   Number of children: Not on file   Years of education: Not on file   Highest education level: Not on file  Occupational History   Not on file  Tobacco Use   Smoking status: Every Day    Current packs/day: 1.00    Average packs/day: 1 pack/day for 51.0 years (51.0 ttl pk-yrs)    Types: Cigarettes    Start date: 61   Smokeless  tobacco: Never  Vaping Use   Vaping status: Never Used  Substance and Sexual Activity   Alcohol use: Not Currently   Drug use: Not Currently   Sexual activity: Not on file  Other Topics Concern   Not on file  Social History Narrative   Not on file   Social Drivers of Health   Financial Resource Strain: Not on file  Food Insecurity: Not on file  Transportation Needs: Not on file  Physical Activity: Not on file  Stress: Not on file  Social Connections: Not on file  Intimate Partner Violence: Not on file     Allergies  Allergen Reactions   Codeine Nausea And Vomiting, Other (See Comments) and Nausea Only   Metformin Other (See Comments)     CBC    Component Value Date/Time   WBC 8.2 12/10/2022 1114   WBC 12.0 (H) 07/14/2021 1657   RBC 5.39 12/10/2022 1114   RBC 5.17 07/14/2021 1657   HGB 16.3 12/10/2022 1114   HCT 50.3 12/10/2022 1114   PLT 296 12/10/2022 1114   MCV 93 12/10/2022 1114   MCH 30.2 12/10/2022 1114   MCH 29.2 07/14/2021 1657   MCHC 32.4 12/10/2022 1114   MCHC 31.5 07/14/2021 1657   RDW 12.5 12/10/2022 1114   LYMPHSABS 2.8 12/10/2022 1114   MONOABS 1.0 07/14/2021 1657   EOSABS 0.3 12/10/2022 1114   BASOSABS 0.1 12/10/2022 1114    Pulmonary Functions Testing Results:     No data to display          Outpatient Medications Prior to Visit  Medication Sig Dispense Refill   tiotropium (SPIRIVA) 18 MCG inhalation capsule Place 18 mcg into inhaler and inhale daily.     ALPRAZolam (XANAX) 1 MG tablet Take 1 mg by mouth 3 (three) times daily.     ARIPiprazole (ABILIFY) 10 MG tablet Take 1 tablet (10 mg total) by mouth daily. 30 tablet 0   aspirin 81 MG EC tablet Take 81 mg by mouth daily.     B Complex-C (B-COMPLEX WITH VITAMIN C) tablet Take 1 tablet by mouth daily.     busPIRone (BUSPAR) 10 MG tablet Take 20 mg by mouth 2 (two) times daily.     cariprazine (VRAYLAR) 3 MG capsule Take 1 capsule (3 mg total) by mouth daily. 30 capsule 0    Cholecalciferol (VITAMIN D3) 50 MCG (2000 UT) capsule Take 2,000 Units by mouth daily.     divalproex (DEPAKOTE) 250 MG DR tablet Take 1 tablet (250 mg total) by mouth 2 (two) times daily. 60 tablet 0   doxycycline (VIBRAMYCIN) 100 MG capsule Take 1 capsule (100 mg total) by mouth 2 (two) times daily. 20 capsule 0   fluticasone (FLONASE) 50 MCG/ACT nasal spray Place 2 sprays into both nostrils daily.     glucose blood ([employer] BLOOD GLUCOSE TEST) test strip Use to test glucose once daily     ibuprofen (ADVIL) 800 MG tablet Take 1 tablet (800 mg total) by mouth every 8 (eight) hours as needed. 30 tablet 0   metoprolol succinate (TOPROL-XL) 25 MG 24 hr tablet ONE A DAY FOR SHAKINESS     omeprazole (PRILOSEC) 40 MG capsule one a day for heartburn     paliperidone (INVEGA) 6 MG 24 hr tablet Take 6 mg by mouth every morning.     prazosin (MINIPRESS) 5 MG capsule Take 1 capsule (5 mg total) by mouth at bedtime. 30 capsule 0   PROAIR HFA 108 (90 Base) MCG/ACT inhaler Inhale 2 puffs into the lungs every 6 (six) hours as needed for wheezing or shortness of breath.     promethazine (PHENERGAN) 25 MG suppository UNWRAP AND INSERT 1 SUPPOSITORY INTO RECTUM EVERY 6 HOURS TO 8 HOURS FOR NAUSEA AND VOMITNG     promethazine (PHENERGAN) 25 MG tablet TAKE 1 TABLET BY MOUTH 3 TIMES A DAY AS NEEDED FOR NAUSEA     QUEtiapine (SEROQUEL)  25 MG tablet Take 25-50 mg by mouth every evening.     rosuvastatin (CRESTOR) 20 MG tablet Take 1 tablet (20 mg total) by mouth daily. 90 tablet 1   sucralfate (CARAFATE) 1 g tablet TAKE 1 TABLET 4 TIMES A DAY 1 HOUR BEFORE MEALS AND AT BEDTIME     traZODone (DESYREL) 100 MG tablet Take 3 tablets (300 mg total) by mouth at bedtime. 90 tablet 0   VIIBRYD 40 MG TABS Take 40 mg by mouth daily.     zolpidem (AMBIEN) 10 MG tablet Take 10 mg by mouth at bedtime.     No facility-administered medications prior to visit.

## 2023-01-19 NOTE — Progress Notes (Unsigned)
Patient ID: Victor Alvarez, male   DOB: [birth year], 62 y.o.   MRN: [id number]  Chief Complaint: Anal rectal polyp.  History of Present Illness Victor Alvarez is a 62 y.o. male with underwent recent colonoscopy with a distal rectal/anal polyp and some adjacent changes that appeared sessile with concern.  He had other polyps removed elsewhere in the colon, all benign.  A very distal rectal/anal polyp that was not able to be removed, with associated sessile changes of concern concern.  Patient unaware of any the above. Cecal and right colon polyps all benign tubular adenomas.  Reports bowel activity is primarily daily.  Some bright red bleeding noted about 2 weeks ago.  Minimal pain or straining noted by patient during defecation.   Past Medical History Past Medical History:  Diagnosis Date   Allergy    Arthritis    COPD (chronic obstructive pulmonary disease) (HCC)    Depression    Neuromuscular disorder (HCC)    PTSD (post-traumatic stress disorder)    Sleep apnea       Past Surgical History:  Procedure Laterality Date   COLONOSCOPY WITH PROPOFOL N/A 01/18/2023   Procedure: COLONOSCOPY WITH PROPOFOL;  Surgeon: Wyline Mood, MD;  Location: [facility] ENDOSCOPY;  Service: Gastroenterology;  Laterality: N/A;   HEMOSTASIS CLIP PLACEMENT  01/18/2023   Procedure: HEMOSTASIS CLIP PLACEMENT;  Surgeon: Wyline Mood, MD;  Location: [facility] ENDOSCOPY;  Service: Gastroenterology;;   POLYPECTOMY  01/18/2023   Procedure: POLYPECTOMY;  Surgeon: Wyline Mood, MD;  Location: [facility] ENDOSCOPY;  Service: Gastroenterology;;   WRIST SURGERY     XI ROBOTIC LAPAROSCOPIC ASSISTED APPENDECTOMY N/A 08/24/2019   Procedure: XI ROBOTIC LAPAROSCOPIC ASSISTED APPENDECTOMY;  Surgeon: Campbell Lerner, MD;  Location: [facility] ORS;  Service: General;  Laterality: N/A;    Allergies  Allergen Reactions   Codeine Nausea And Vomiting, Other (See Comments) and Nausea Only   Metformin Other (See Comments)    Current Outpatient Medications   Medication Sig Dispense Refill   ALPRAZolam (XANAX) 1 MG tablet Take 1 mg by mouth 3 (three) times daily.     ARIPiprazole (ABILIFY) 10 MG tablet Take 1 tablet (10 mg total) by mouth daily. 30 tablet 0   aspirin 81 MG EC tablet Take 81 mg by mouth daily.     B Complex-C (B-COMPLEX WITH VITAMIN C) tablet Take 1 tablet by mouth daily.     busPIRone (BUSPAR) 10 MG tablet Take 20 mg by mouth 2 (two) times daily.     cariprazine (VRAYLAR) 3 MG capsule Take 1 capsule (3 mg total) by mouth daily. 30 capsule 0   Cholecalciferol (VITAMIN D3) 50 MCG (2000 UT) capsule Take 2,000 Units by mouth daily.     doxycycline (VIBRAMYCIN) 100 MG capsule Take 1 capsule (100 mg total) by mouth 2 (two) times daily. 20 capsule 0   fluticasone (FLONASE) 50 MCG/ACT nasal spray Place 2 sprays into both nostrils daily.     glucose blood ([employer] BLOOD GLUCOSE TEST) test strip Use to test glucose once daily     ibuprofen (ADVIL) 800 MG tablet Take 1 tablet (800 mg total) by mouth every 8 (eight) hours as needed. 30 tablet 0   metoprolol succinate (TOPROL-XL) 25 MG 24 hr tablet ONE A DAY FOR SHAKINESS     omeprazole (PRILOSEC) 40 MG capsule one a day for heartburn     paliperidone (INVEGA) 6 MG 24 hr tablet Take 6 mg by mouth every morning.     PROAIR HFA 108 (90 Base) MCG/ACT inhaler  Inhale 2 puffs into the lungs every 6 (six) hours as needed for wheezing or shortness of breath.     promethazine (PHENERGAN) 25 MG suppository UNWRAP AND INSERT 1 SUPPOSITORY INTO RECTUM EVERY 6 HOURS TO 8 HOURS FOR NAUSEA AND VOMITNG     promethazine (PHENERGAN) 25 MG tablet TAKE 1 TABLET BY MOUTH 3 TIMES A DAY AS NEEDED FOR NAUSEA     QUEtiapine (SEROQUEL) 25 MG tablet Take 25-50 mg by mouth every evening.     rosuvastatin (CRESTOR) 20 MG tablet Take 1 tablet (20 mg total) by mouth daily. 90 tablet 1   sucralfate (CARAFATE) 1 g tablet TAKE 1 TABLET 4 TIMES A DAY 1 HOUR BEFORE MEALS AND AT BEDTIME     Tiotropium Bromide-Olodaterol (STIOLTO  RESPIMAT) 2.5-2.5 MCG/ACT AERS Inhale 2 puffs into the lungs daily. 60 each 12   VIIBRYD 40 MG TABS Take 40 mg by mouth daily.     zolpidem (AMBIEN) 10 MG tablet Take 10 mg by mouth at bedtime.     divalproex (DEPAKOTE) 250 MG DR tablet Take 1 tablet (250 mg total) by mouth 2 (two) times daily. 60 tablet 0   prazosin (MINIPRESS) 5 MG capsule Take 1 capsule (5 mg total) by mouth at bedtime. 30 capsule 0   traZODone (DESYREL) 100 MG tablet Take 3 tablets (300 mg total) by mouth at bedtime. 90 tablet 0   No current facility-administered medications for this visit.    Family History History reviewed. No pertinent family history.    Social History Social History   Tobacco Use   Smoking status: Every Day    Current packs/day: 1.00    Average packs/day: 1 pack/day for 51.0 years (51.0 ttl pk-yrs)    Types: Cigarettes    Start date: 1974   Smokeless tobacco: Never  Vaping Use   Vaping status: Never Used  Substance Use Topics   Alcohol use: Not Currently   Drug use: Not Currently        Review of Systems  Constitutional: Negative.   HENT: Negative.    Eyes:  Positive for blurred vision.  Respiratory:  Positive for shortness of breath and wheezing.   Cardiovascular: Negative.   Gastrointestinal: Negative.   Genitourinary: Negative.   Skin: Negative.   Neurological: Negative.   Psychiatric/Behavioral:  The patient is nervous/anxious.      Physical Exam Blood pressure 132/71, pulse 73, temperature 97.8 F (36.6 C), temperature source Oral, height 5\' 10"  (1.778 m), weight 227 lb (103 kg), SpO2 94%. Last Weight  Most recent update: 01/19/2023  2:15 PM    Weight  103 kg (227 lb)             CONSTITUTIONAL: Well developed, and nourished, appropriately responsive and aware without distress.   EYES: Sclera non-icteric.   EARS, NOSE, MOUTH AND THROAT:  The oropharynx is clear. Oral mucosa is pink and moist.    Hearing is intact to voice.  NECK: Trachea is midline, and there  is no jugular venous distension.  LYMPH NODES:  Lymph nodes in the neck are not appreciated. RESPIRATORY:  Lungs are clear, and breath sounds are equal bilaterally.  Normal respiratory effort without pathologic use of accessory muscles. CARDIOVASCULAR: Heart is regular in rate and rhythm.   Well perfused.  GI: The abdomen is  soft, nontender, and nondistended. There were no palpable masses.  I did not appreciate hepatosplenomegaly. GU: DRE completed, he has circumferential to, possibly 3 columns of significant internal/external hemorrhoids.  No  evidence of prolapsing.  There is some irregularity on digital exam, however due to degree of discomfort and voluntary anal spasm could not complete adequate exam for the concerns raised in the HPI. MUSCULOSKELETAL:  Symmetrical muscle tone appreciated in all four extremities.    SKIN: Skin turgor is normal. No pathologic skin lesions appreciated.  NEUROLOGIC:  Motor and sensation appear grossly normal.  Cranial nerves are grossly without defect. PSYCH:  Alert and oriented to person, place and time. Affect is appropriate for situation.  Data Reviewed I have personally reviewed what is currently available of the patient's imaging, recent labs and medical records.   Labs:     Latest Ref Rng & Units 12/10/2022   11:14 AM 07/14/2021    4:57 PM 08/24/2019   10:09 AM  CBC  WBC 3.4 - 10.8 x10E3/uL 8.2  12.0  11.6   Hemoglobin 13.0 - 17.7 g/dL 16.1  09.6  04.5   Hematocrit 37.5 - 51.0 % 50.3  47.9  46.6   Platelets 150 - 450 x10E3/uL 296  382  336       Latest Ref Rng & Units 12/10/2022   11:14 AM 07/14/2021    4:57 PM 08/24/2019   10:09 AM  CMP  Glucose 70 - 99 mg/dL 409  811  914   BUN 8 - 27 mg/dL 10  13  8    Creatinine 0.76 - 1.27 mg/dL 7.82  9.56  2.13   Sodium 134 - 144 mmol/L 139  137  138   Potassium 3.5 - 5.2 mmol/L 4.7  4.1  4.0   Chloride 96 - 106 mmol/L 99  100  103   CO2 20 - 29 mmol/L 26  31  26    Calcium 8.6 - 10.2 mg/dL 9.9  9.2  9.2    Total Protein 6.0 - 8.5 g/dL 6.8  7.7  7.6   Total Bilirubin 0.0 - 1.2 mg/dL 0.5  0.7  1.0   Alkaline Phos 44 - 121 IU/L 94  81  76   AST 0 - 40 IU/L 16  26  20    ALT 0 - 44 IU/L 20  30  26        Imaging: Radiological images reviewed:   Within last 24 hrs: No results found.  Assessment    Internal and external hemorrhoids, grade 2-3. Distal rectal/anal polyp, questionable condyloma. Patient Active Problem List   Diagnosis Date Noted   Encounter for screening colonoscopy 01/18/2023   Adenomatous polyp of colon 01/18/2023   Depression, recurrent (HCC) 01/09/2023   Smoking 12/11/2022   Polypharmacy 12/11/2022   Closed fracture of left wrist 06/03/2022   Acute prostatitis 04/21/2022   Tremor 04/06/2022   Erythrocytosis 02/03/2022   Syncope 08/16/2021   Bilateral tinnitus 08/08/2021   Bereavement 08/08/2021   Vertigo 08/08/2021   Low back pain 07/04/2021   Spasm of back muscles 07/04/2021   History of alcoholism (HCC) 04/30/2021   Gastritis 02/06/2021   Chronic obstructive pulmonary disease (HCC) 09/24/2020   Insomnia 09/24/2020   Anxiety 09/24/2020   Allergic rhinitis 09/24/2020   Gastroesophageal reflux disease 09/24/2020   Lethargy 09/24/2020   Hypercholesterolemia 09/24/2020   Nausea 09/24/2020   Obstructive sleep apnea syndrome 09/24/2020   Snoring 09/24/2020   Type 2 diabetes mellitus (HCC) 09/24/2020   Vitamin D deficiency 09/24/2020   Depressive disorder 09/24/2020   Panic attack 09/24/2020   Acute appendicitis 08/24/2019   History of appendectomy 08/07/2019   Mixed hyperlipidemia 01/18/2017   Type 2  diabetes mellitus without complication, without long-term current use of insulin (HCC) 01/18/2017   Cubital tunnel syndrome on right 12/11/2016   Obesity 06/26/2016   Primary osteoarthritis of first carpometacarpal joint of right hand 04/29/2016   Essential tremor 12/18/2015   Corneal scar, right eye 10/24/2015   Status post cataract extraction and  insertion of intraocular lens of right eye 10/24/2015   Nuclear sclerosis, right 10/23/2015   Fracture of scaphoid bone of wrist 03/25/2015   Ulnar abutment syndrome of right wrist 03/25/2015   Bipolar affective disorder, currently depressed, moderate (HCC) 07/20/2014   Tobacco use 06/29/2014   Cortical senile cataract of right eye 06/15/2014   Recurrent bronchospasm 06/15/2014   Posttraumatic stress disorder 01/20/2014    Plan    Rectal examination under anesthesia, anticipating 2-3 column internal/external hemorrhoidectomy, anticipating this will remove the area of concern along with the anal rectal condylomatous appearing polyp.  Risk discussed with patient these include but not limited to anesthesia, bleeding, recurrence, need for additional procedures based on pathology.  We discussed anal stenosis/diminished flatulent/fecal control.  Possible soilage or discharge from irregularity of anal canal following hemorrhoidectomy.  I believe he understands and desires to proceed.  No guarantees were ever expressed or implied. Face-to-face time spent with the patient and accompanying care providers(if present) was 40 minutes, with more than 50% of the time spent counseling, educating, and coordinating care of the patient.    These notes generated with voice recognition software. I apologize for typographical errors.  Campbell Lerner M.D., FACS 01/19/2023, 3:43 PM

## 2023-01-19 NOTE — Patient Instructions (Signed)
You have requested to have Hemorrhoid surgery today. This will be scheduled at [facility] with Dr Claudine Mouton.  Please review the information below and your [facility] Information. Our surgery scheduler will call you to review surgery date and to go over information.  If you have FMLA or disability paperwork that needs filled out you may drop this off at our office or this can be faxed to [phone].  You will be required to do 2 enemas prior to your surgery. The first will be the night prior and the second will be done the morning of surgery.  Constipation is going to be your biggest obstacle following surgery. Use all stool softeners and laxatives as prescribed after your surgery and be sure to drink 72 ounces of water or more every day. This will help to avoid constipation. If you do all of this and you are still having difficulty, please call our office for further instructions as soon as you begin to have difficulty with bowel movements.  You may want to buy a disposable Sitz bath prior to surgery to aid in pain and cleanliness after surgery. The information on how to do use a disposable sitz bath or using a bath tub are below.  You will be out of work 1-2 weeks depending on how your healing goes. If you have FMLA/Disability paperwork that needs filled out you may drop this off at the office or fax it to [phone].   Hemorrhoid Surgery After Care Refer to this sheet in the next few weeks. These instructions provide you with information about caring for yourself after your procedure. Your health care provider may also give you more specific instructions. Your treatment has been planned according to current medical practices, but problems sometimes occur. Call your health care provider if you have any problems or questions after your procedure. What can I expect after the procedure? After the procedure, it is common to have: Rectal pain. Pain when you are having a bowel movement. Slight  rectal bleeding.  Follow these instructions at home: Medicines Take over-the-counter and prescription medicines only as told by your health care provider. Do not drive or operate heavy machinery while taking prescription pain medicine. Use a stool softener or a bulk laxative as told by your health care provider. Activity Rest at home. Return to your normal activities as told by your health care provider. Do not lift anything that is heavier than 10 lb (4.5 kg). Do not sit for long periods of time. Take a walk every day or as told by your health care provider. Do not strain to have a bowel movement. Do not spend a long time sitting on the toilet. Eating and drinking Eat foods that contain fiber, such as whole grains, beans, nuts, fruits, and vegetables. Drink enough fluid to keep your urine clear or pale yellow. General instructions Sit in a warm bath 2-3 times per day to relieve soreness or itching. Keep all follow-up visits as told by your health care provider. This is important. Contact a health care provider if: Your pain medicine is not helping. You have a fever or chills. You become constipated. You have trouble passing urine. Get help right away if: You have very bad rectal pain. You have heavy bleeding from your rectum. This information is not intended to replace advice given to you by your health care provider. Make sure you discuss any questions you have with your health care provider. Document Released: 04/11/2003 Document Revised: 06/27/2015 Document Reviewed: 04/16/2014 Elsevier Interactive  Patient Education  2018 Elsevier Inc.   Disposable Sitz Bath A disposable sitz bath is a plastic basin that fits over the toilet. A bag is hung above the toilet, and the bag is connected to a tube that opens into the basin. The bag is filled with warm water that flows into the basin through the tube. A sitz bath can be used to help relieve symptoms, clean, and promote healing in the  genital and anal areas, as well as in the lower abdomen and buttocks. What are the risks? Sitz baths are generally very safe. It is possible for the skin between the genitals and the anus (perineum) to become infected, but this is rare. You can avoid this by cleaning your sitz bath supplies thoroughly. How to use a disposable sitz bath Close the clamp on the tube. Make sure the clamp is closed tightly to prevent leakage. Fill the sitz bath basin and the plastic bag with warm water. The water should be warm enough to be comfortable, but not hot. Raise the toilet seat and place the filled basin on the toilet. Make sure the overflow opening is facing toward the back of the toilet. If you prefer, you may place the empty basin on the toilet first, and then use the plastic bag to fill the basin with warm water. Hang the filled plastic bag overhead on a hook or towel rack close to the toilet. The bag should be higher than the toilet so that the water will flow down through the tube. Attach the tube to the opening on the basin. Make sure that the tube is attached to the basin tightly to prevent leakage. Sit on the basin and release the clamp. This will allow warm water to flow into the basin and flush the area around your genitals and anus. Remain sitting on the basin for about 15-20 minutes, or as long as told by your health care provider. Stand up and gently pat your skin dry. If directed, apply clean bandages (dressings) to the affected area as told by your health care provider. Carefully remove the basin from the toilet seat and tip the basin into the toilet to empty any remaining water. Empty any remaining water from the plastic bag into the toilet. Then, flush the toilet. Wash the basin with warm water and soap. Let the basin air dry in the sink. You should also let the plastic bag and the tubing air dry. Store the basin, tubing, and plastic bag in a clean, dry area. Wash your hands with soap and  water. If soap and water are not available, use hand sanitizer. Contact a health care provider if: You have symptoms that get worse instead of better. You develop new skin irritation, redness, or swelling around your genitals or anus. This information is not intended to replace advice given to you by your health care provider. Make sure you discuss any questions you have with your health care provider. Document Released: 07/21/2011 Document Revised: 06/27/2015 Document Reviewed: 12/09/2014 Elsevier Interactive Patient Education  2018 [employer].   How to Take a [employer] A sitz bath is a warm water bath that is taken while you are sitting down. The water should only come up to your hips and should cover your buttocks. Your health care provider may recommend a sitz bath to help you: Clean the lower part of your body, including your genital area. With itching. With pain. With sore muscles or muscles that tighten or spasm.  How to  take a sitz bath Take 3-4 sitz baths per day or as told by your health care provider. Partially fill a bathtub with warm water. You will only need the water to be deep enough to cover your hips and buttocks when you are sitting in it. If your health care provider told you to put medicine in the water, follow the directions exactly. Sit in the water and open the tub drain a little. Turn on the warm water again to keep the tub at the correct level. Keep the water running constantly. Soak in the water for 15-20 minutes or as told by your health care provider. After the sitz bath, pat the affected area dry first. Do not rub it. Be careful when you stand up after the sitz bath because you may feel dizzy.  Contact a health care provider if: Your symptoms get worse. Do not continue with sitz baths if your symptoms get worse. You have new symptoms. Do not continue with sitz baths until you talk with your health care provider. This information is not intended to replace  advice given to you by your health care provider. Make sure you discuss any questions you have with your health care provider. Document Released: 10/12/2003 Document Revised: 06/19/2015 Document Reviewed: 01/17/2014 Elsevier Interactive Patient Education  [employer].

## 2023-01-20 ENCOUNTER — Telehealth: Payer: Self-pay | Admitting: Surgery

## 2023-01-20 ENCOUNTER — Encounter: Payer: Self-pay | Admitting: Surgery

## 2023-01-20 ENCOUNTER — Ambulatory Visit
Admission: RE | Admit: 2023-01-20 | Discharge: 2023-01-20 | Disposition: A | Discharge status: Home / Self Care | Payer: MEDICAID | Source: Ambulatory Visit | Attending: Student in an Organized Health Care Education/Training Program | Admitting: Student in an Organized Health Care Education/Training Program

## 2023-01-20 DIAGNOSIS — R0602 Shortness of breath: Secondary | ICD-10-CM | POA: Diagnosis present

## 2023-01-20 DIAGNOSIS — K641 Second degree hemorrhoids: Secondary | ICD-10-CM | POA: Insufficient documentation

## 2023-01-20 DIAGNOSIS — K620 Anal polyp: Secondary | ICD-10-CM | POA: Insufficient documentation

## 2023-01-20 NOTE — Telephone Encounter (Signed)
Left message for patient to call, please inform him of the following regarding scheduled surgery with Dr. Claudine Mouton.   Pre-Admission date/time, and Surgery date at [facility].  Surgery Date: 02/10/23 Preadmission Testing Date: 02/04/23 (phone 1p-4p)  Also patient will need to call at [phone], between 1-3:00pm the day before surgery, to find out what time to arrive for surgery.

## 2023-01-21 NOTE — Telephone Encounter (Signed)
Outgoing call is made again, patient informed of all dates regarding surgery.

## 2023-01-22 ENCOUNTER — Ambulatory Visit: Admission: RE | Admit: 2023-01-22 | Payer: MEDICAID | Source: Ambulatory Visit

## 2023-01-25 ENCOUNTER — Other Ambulatory Visit: Payer: Self-pay | Admitting: Physician Assistant

## 2023-01-25 NOTE — Telephone Encounter (Signed)
Medication Refill -  Most Recent Primary Care Visit:  Provider: Debera Lat  Department: [facility]  Visit Type: OFFICE VISIT  Date: 01/08/2023  Medication: busPIRone (BUSPAR) 10 MG tablet [[id number]]  Historical provider. Pt states that he is a patient of Victor Alvarez now.    Has the patient contacted their pharmacy? Yes (Agent: If no, request that the patient contact the pharmacy for the refill. If patient does not wish to contact the pharmacy document the reason why and proceed with request.) (Agent: If yes, when and what did the pharmacy advise?)  Is this the correct pharmacy for this prescription? Yes If no, delete pharmacy and type the correct one.  This is the patient's preferred pharmacy:  [facility] [address], [place] - [name] [address] Phone: [phone] Fax: [phone]   Has the prescription been filled recently? Yes  Is the patient out of the medication? Yes  Has the patient been seen for an appointment in the last year OR does the patient have an upcoming appointment? Yes  Can we respond through MyChart? Yes  Agent: Please be advised that Rx refills may take up to 3 business days. We ask that you follow-up with your pharmacy.

## 2023-01-26 NOTE — Telephone Encounter (Signed)
Requested medication (s) are due for refill today: Amount not specified  Requested medication (s) are on the active medication list: yes    Last refill: 08/24/19  Future visit scheduled Yes 02/19/23  Notes to clinic:Historical provider, please review.   Requested Prescriptions  Pending Prescriptions Disp Refills   busPIRone (BUSPAR) 10 MG tablet 60 tablet     Sig: Take 1 tablet (10 mg total) by mouth 2 (two) times daily.     Psychiatry: Anxiolytics/Hypnotics - Non-controlled Passed - 01/26/2023  1:50 PM      Passed - Valid encounter within last 12 months    Recent Outpatient Visits           2 weeks ago Mixed hyperlipidemia   [facility] [place], [place], PA-C   1 month ago Encounter for medical examination to establish care   [facility] [place], [place], PA-C       Future Appointments             In 3 weeks Debera Lat, PA-C [facility]   In 2 months End, Cristal Deer, MD [facility] [place]   In 2 months Raechel Chute, MD [facility]

## 2023-01-28 ENCOUNTER — Other Ambulatory Visit: Payer: Self-pay | Admitting: Physician Assistant

## 2023-01-28 ENCOUNTER — Other Ambulatory Visit: Payer: Self-pay | Admitting: Family Medicine

## 2023-01-28 ENCOUNTER — Encounter: Payer: Self-pay | Admitting: Family Medicine

## 2023-01-28 ENCOUNTER — Telehealth: Payer: Self-pay

## 2023-01-28 NOTE — Telephone Encounter (Signed)
Medication Refill -  Most Recent Primary Care Visit:  Provider: Debera Lat  Department: [facility]  Visit Type: OFFICE VISIT  Date: 01/08/2023  Medication: ARIPiprazole (ABILIFY) 10 MG tablet   Has the patient contacted their pharmacy? Yes   Is this the correct pharmacy for this prescription? Yes If no, delete pharmacy and type the correct one.  This is the patient's preferred pharmacy:  [facility] [address] [place] - [address] [place] [address] Phone: [phone] Fax: [phone]   Has the prescription been filled recently? No  Is the patient out of the medication? No but he only has a few pills left  Has the patient been seen for an appointment in the last year OR does the patient have an upcoming appointment? Yes  Can we respond through MyChart? Yes  Please assist patient further as he also left a message previously about getting his Buspar refilled and is waiting to hear back from his provider

## 2023-01-28 NOTE — Telephone Encounter (Signed)
Copied from CRM [id number]. Topic: Referral - Question >> Jan 28, 2023  3:17 PM Marlow Baars wrote: Reason for CRM: The patient called in stating he believes [facility] does not participate with [facility]. This is why he was asking about the Buspar from his provider. Please assist patient further.

## 2023-01-29 ENCOUNTER — Telehealth: Payer: Self-pay | Admitting: Physician Assistant

## 2023-01-29 MED ORDER — BUSPIRONE HCL 30 MG PO TABS: 30.0000 mg | ORAL_TABLET | Freq: Two times a day (BID) | ORAL | 3 refills | Status: DC

## 2023-01-29 NOTE — Telephone Encounter (Signed)
I called and spoke to Victor Alvarez. He is taking Buspirone 30 mg BID. He confirmed dosage on his medication bottle. He uses [facility] [employer].  He asked "when will we get all my meds straight?" I asked for more clarification. He states he has been coming to [place] for several months, but we aren't filling his medications for him. I instructed him to update his pharmacy and ask them to send refill requests to our office rather than his former PCP in [place]. Patient voiced understanding.

## 2023-01-29 NOTE — Telephone Encounter (Signed)
Please verify dosage. Pharmacy dispense history report says he's been getting 30mg  tabs.

## 2023-01-29 NOTE — Telephone Encounter (Signed)
Pt is calling in because he has had some issues getting busPIRone (BUSPAR) 10 MG tablet [[id number]] because it was prescribed by a doctor in [place]. Pt wants to know what he needs to do so he can get the medication filled by his current PCP. Please follow up with pt.

## 2023-02-02 NOTE — Telephone Encounter (Signed)
 Requested medication (s) are due for refill today: yes  Requested medication (s) are on the active medication list: yes    Last refill: 12/30/22  #30  0 refills  Future visit scheduled yes 02/19/23  Notes to clinic:Not delegated, please review. Thank you.  Requested Prescriptions  Pending Prescriptions Disp Refills   ARIPiprazole  (ABILIFY ) 10 MG tablet 30 tablet 0    Sig: Take 1 tablet (10 mg total) by mouth daily.     Not Delegated - Psychiatry:  Antipsychotics - Second Generation (Atypical) - aripiprazole  Failed - 02/02/2023 12:33 PM      Failed - This refill cannot be delegated      Failed - Lipid Panel in normal range within the last 12 months    Cholesterol, Total  Date Value Ref Range Status  12/10/2022 173 100 - 199 mg/dL Final   LDL Chol Calc (NIH)  Date Value Ref Range Status  12/10/2022 114 (H) 0 - 99 mg/dL Final   HDL  Date Value Ref Range Status  12/10/2022 37 (L) >39 mg/dL Final   Triglycerides  Date Value Ref Range Status  12/10/2022 124 0 - 149 mg/dL Final         Passed - TSH in normal range and within 360 days    TSH  Date Value Ref Range Status  12/10/2022 2.460 0.450 - 4.500 uIU/mL Final         Passed - Completed PHQ-2 or PHQ-9 in the last 360 days      Passed - Last BP in normal range    BP Readings from Last 1 Encounters:  01/19/23 132/71         Passed - Last Heart Rate in normal range    Pulse Readings from Last 1 Encounters:  01/19/23 73         Passed - Valid encounter within last 6 months    Recent Outpatient Visits           3 weeks ago Mixed hyperlipidemia   [place] [facility] [place], [place], PA-C   1 month ago Encounter for medical examination to establish care   [facility] [place], [place], PA-C       Future Appointments             In 2 weeks Ostwalt, Janna, PA-C [facility], [facility]   In 3 weeks Isadora Hose, MD [facility] [place] [facility] at [place]   In 1 month End, Lonni, MD [facility] at [place]   In 2 months Isadora Hose, MD [facility] at [facility] - CBC within normal limits and completed in the last 12 months    WBC  Date Value Ref Range Status  12/10/2022 8.2 3.4 - 10.8 x10E3/uL Final  07/14/2021 12.0 (H) 4.0 - 10.5 K/uL Final   RBC  Date Value Ref Range Status  12/10/2022 5.39 4.14 - 5.80 x10E6/uL Final  07/14/2021 5.17 4.22 - 5.81 MIL/uL Final   Hemoglobin  Date Value Ref Range Status  12/10/2022 16.3 13.0 - 17.7 g/dL Final   Hematocrit  Date Value Ref Range Status  12/10/2022 50.3 37.5 - 51.0 % Final   MCHC  Date Value Ref Range Status  12/10/2022 32.4 31.5 - 35.7 g/dL Final  93/87/7976 68.4 30.0 - 36.0 g/dL Final   [facility]  Date Value Ref Range Status  12/10/2022 30.2 26.6 -  33.0 pg Final  07/14/2021 29.2 26.0 - 34.0 pg Final   MCV  Date Value Ref Range Status  12/10/2022 93 79 - 97 fL Final   No results found for: PLTCOUNTKUC, LABPLAT, POCPLA RDW  Date Value Ref Range Status  12/10/2022 12.5 11.6 - 15.4 % Final         Passed - CMP within normal limits and completed in the last 12 months    Albumin  Date Value Ref Range Status  12/10/2022 4.6 3.9 - 4.9 g/dL Final   Alkaline Phosphatase  Date Value Ref Range Status  12/10/2022 94 44 - 121 IU/L Final   ALT  Date Value Ref Range Status  12/10/2022 20 0 - 44 IU/L Final   AST  Date Value Ref Range Status  12/10/2022 16 0 - 40 IU/L Final   BUN  Date Value Ref Range Status  12/10/2022 10 8 - 27 mg/dL Final   Calcium   Date Value Ref Range Status  12/10/2022 9.9 8.6 - 10.2 mg/dL Final   CO2  Date Value Ref Range Status  12/10/2022 26 20 - 29 mmol/L Final   Creatinine, Ser  Date Value Ref Range Status  12/10/2022 0.93 0.76 - 1.27 mg/dL Final   Glucose  Date Value Ref Range Status  12/10/2022 101 (H) 70 - 99 mg/dL Final   Glucose, Bld  Date  Value Ref Range Status  07/14/2021 164 (H) 70 - 99 mg/dL Final    Comment:    Glucose reference range applies only to samples taken after fasting for at least 8 hours.   Glucose-Capillary  Date Value Ref Range Status  01/18/2023 110 (H) 70 - 99 mg/dL Final    Comment:    Glucose reference range applies only to samples taken after fasting for at least 8 hours.   Potassium  Date Value Ref Range Status  12/10/2022 4.7 3.5 - 5.2 mmol/L Final   Sodium  Date Value Ref Range Status  12/10/2022 139 134 - 144 mmol/L Final   Bilirubin Total  Date Value Ref Range Status  12/10/2022 0.5 0.0 - 1.2 mg/dL Final   Protein, ur  Date Value Ref Range Status  08/24/2019 NEGATIVE NEGATIVE mg/dL Final   Total Protein  Date Value Ref Range Status  12/10/2022 6.8 6.0 - 8.5 g/dL Final   GFR calc Af Amer  Date Value Ref Range Status  08/24/2019 >60 >60 mL/min Final   eGFR  Date Value Ref Range Status  12/10/2022 92 >59 mL/min/1.73 Final   GFR, Estimated  Date Value Ref Range Status  07/14/2021 >60 >60 mL/min Final    Comment:    (NOTE) Calculated using the CKD-EPI Creatinine Equation (2021)

## 2023-02-04 ENCOUNTER — Encounter
Admission: RE | Admit: 2023-02-04 | Discharge: 2023-02-04 | Disposition: A | Discharge status: Home / Self Care | Payer: MEDICAID | Source: Ambulatory Visit | Attending: Surgery | Admitting: Surgery

## 2023-02-04 ENCOUNTER — Other Ambulatory Visit: Payer: Self-pay

## 2023-02-04 ENCOUNTER — Ambulatory Visit: Payer: Self-pay | Admitting: Surgery

## 2023-02-04 VITALS — Ht 70.0 in | Wt 225.0 lb

## 2023-02-04 DIAGNOSIS — Z01810 Encounter for preprocedural cardiovascular examination: Secondary | ICD-10-CM

## 2023-02-04 DIAGNOSIS — E119 Type 2 diabetes mellitus without complications: Secondary | ICD-10-CM

## 2023-02-04 DIAGNOSIS — Z01812 Encounter for preprocedural laboratory examination: Secondary | ICD-10-CM

## 2023-02-04 DIAGNOSIS — K641 Second degree hemorrhoids: Secondary | ICD-10-CM

## 2023-02-04 DIAGNOSIS — K620 Anal polyp: Secondary | ICD-10-CM

## 2023-02-04 DIAGNOSIS — J449 Chronic obstructive pulmonary disease, unspecified: Secondary | ICD-10-CM

## 2023-02-04 DIAGNOSIS — E782 Mixed hyperlipidemia: Secondary | ICD-10-CM

## 2023-02-04 HISTORY — DX: Type 2 diabetes mellitus without complications: E11.9

## 2023-02-04 HISTORY — DX: Gastro-esophageal reflux disease without esophagitis: K21.9

## 2023-02-04 HISTORY — DX: Polyp of colon: K63.5

## 2023-02-04 HISTORY — DX: Unspecified hemorrhoids: K64.9

## 2023-02-04 HISTORY — DX: Bipolar disorder, unspecified: F31.9

## 2023-02-04 HISTORY — DX: Mixed hyperlipidemia: E78.2

## 2023-02-04 HISTORY — DX: Pure hypercholesterolemia, unspecified: E78.00

## 2023-02-04 NOTE — Patient Instructions (Addendum)
 Your procedure is scheduled on: Wednesday, January 8 Report to the Registration Desk on the 1st floor of the [facility] To find out your arrival time, please call [phone] between 1PM - 3PM on: Tuesday, January 7 If your arrival time is 6:00 am, do not arrive before that time as the [facility] entrance doors do not open until 6:00 am.  REMEMBER: Instructions that are not followed completely may result in serious medical risk, up to and including death; or upon the discretion of your surgeon and anesthesiologist your surgery may need to be rescheduled.  Do not eat food after midnight the night before surgery.  No gum chewing or hard candies.  You may however, drink water  up to 2 hours before you are scheduled to arrive for your surgery. Do not drink anything within 2 hours of your scheduled arrival time.  One week prior to surgery: starting today, January 2 Stop aspirin  and Anti-inflammatories (NSAIDS) such as Advil , Aleve, Ibuprofen , Motrin , Naproxen, Naprosyn and Aspirin  based products such as Excedrin, Goody's Powder, BC Powder. Stop ANY OVER THE COUNTER supplements until after surgery. Stop vitamin B, D.  You may however, continue to take Tylenol  if needed for pain up until the day of surgery.  Continue taking all of your other prescription medications up until the day of surgery.  ON THE DAY OF SURGERY ONLY TAKE THESE MEDICATIONS WITH SIPS OF WATER :  Alprazolam  (Xanax ) if needed for anxiety Aripiprazole  (Abilify ) Buspirone  (Buspar ) cariprazine  (VRAYLAR )  divalproex  (DEPAKOTE )  fluticasone  (FLONASE ) nasal spray metoprolol  succinate  omeprazole  (PRILOSEC)  paliperidone  (INVEGA )  rosuvastatin  (CRESTOR   Tiotropium Bromide-Olodaterol (STIOLTO RESPIMAT ) inhaler VIIBRYD    Use inhalers on the day of surgery and bring your albuterol  inhaler to the hospital.  Fleets Enema - use as directed; one the night before surgery and one the morning of surgery before coming to the  hospital.  No Alcohol for 24 hours before or after surgery.  No Smoking including e-cigarettes for 24 hours before surgery.  No chewable tobacco products for at least 6 hours before surgery.  No nicotine patches on the day of surgery.  Do not use any recreational drugs for at least a week (preferably 2 weeks) before your surgery.  Please be advised that the combination of cocaine and anesthesia may have negative outcomes, up to and including death. If you test positive for cocaine, your surgery will be cancelled.  On the morning of surgery brush your teeth with toothpaste and water , you may rinse your mouth with mouthwash if you wish. Do not swallow any toothpaste or mouthwash.  Do not wear jewelry, make-up, hairpins, clips or nail polish.  For welded (permanent) jewelry: bracelets, anklets, waist bands, etc.  Please have this removed prior to surgery.  If it is not removed, there is a chance that hospital personnel will need to cut it off on the day of surgery.  Do not wear lotions, powders, or perfumes.   Do not shave body hair from the neck down 48 hours before surgery.  Contact lenses, hearing aids and dentures may not be worn into surgery.  Do not bring valuables to the hospital. [facility] is not responsible for any missing/lost belongings or valuables.   Notify your doctor if there is any change in your medical condition (cold, fever, infection).  Wear comfortable clothing (specific to your surgery type) to the hospital.  After surgery, you can help prevent lung complications by doing breathing exercises.  Take deep breaths and cough every 1-2  hours.   If you are being discharged the day of surgery, you will not be allowed to drive home. You will need a responsible individual to drive you home and stay with you for 24 hours after surgery.   If you are taking public transportation, you will need to have a responsible individual with you.  Please call the Pre-admissions  Testing Dept. at [phone] if you have any questions about these instructions.  Surgery Visitation Policy:  Patients having surgery or a procedure may have two visitors.  Children under the age of 62 must have an adult with them who is not the patient.

## 2023-02-05 ENCOUNTER — Encounter
Admission: RE | Admit: 2023-02-05 | Discharge: 2023-02-05 | Disposition: A | Discharge status: Home / Self Care | Payer: MEDICAID | Source: Ambulatory Visit | Attending: Surgery | Admitting: Surgery

## 2023-02-05 DIAGNOSIS — E119 Type 2 diabetes mellitus without complications: Secondary | ICD-10-CM | POA: Diagnosis not present

## 2023-02-05 DIAGNOSIS — J449 Chronic obstructive pulmonary disease, unspecified: Secondary | ICD-10-CM | POA: Diagnosis not present

## 2023-02-05 DIAGNOSIS — Z01810 Encounter for preprocedural cardiovascular examination: Secondary | ICD-10-CM

## 2023-02-05 DIAGNOSIS — E782 Mixed hyperlipidemia: Secondary | ICD-10-CM | POA: Insufficient documentation

## 2023-02-05 DIAGNOSIS — K641 Second degree hemorrhoids: Secondary | ICD-10-CM

## 2023-02-05 DIAGNOSIS — Z01812 Encounter for preprocedural laboratory examination: Secondary | ICD-10-CM

## 2023-02-05 DIAGNOSIS — Z01818 Encounter for other preprocedural examination: Secondary | ICD-10-CM | POA: Diagnosis present

## 2023-02-05 NOTE — Progress Notes (Signed)
 Isadora Hose, MD Physician Pulmonology   Progress Notes    Signed   Encounter Date: 01/19/2023   Signed     Expand All Collapse All     Assessment & Plan:    1. Shortness of breath (Primary)   Patient has a history of presumed COPD maintained on LABA therapy with spiriva handihaler. He reports worsening symptoms after running out. He has a long standing history of smoking, and works on a farm. My differential for his symptoms includes COPD (secondary to long standing smoking history) as well as pneumoconiosis (farmer's lung). I will need to rule out COPD with a PFT (spirometery, lung volumes, and DLCO), as well as obtain a high resolution chest CT to rule out interstitial lung disease and hyper sensitivity pneumonitis. FENO was normal today at 10 ppb. Max eosinophil count was 300 historically. Finally, I will initiate the patient on LABA/LAMA therapy with Stiolto Respimat .   - Nitric oxide  - Tiotropium Bromide-Olodaterol (STIOLTO RESPIMAT ) 2.5-2.5 MCG/ACT AERS; Inhale 2 puffs into the lungs daily.  Dispense: 60 each; Refill: 12 - Pulmonary Function Test [facility] Only; Future - CT CHEST HIGH RESOLUTION; Future - Patient counseled to quit smoking   Return in about 3 months (around 04/19/2023).   I spent 60 minutes caring for this patient today, including preparing to see the patient, obtaining a medical history , reviewing a separately obtained history, performing a medically appropriate examination and/or evaluation, counseling and educating the patient/family/caregiver, ordering medications, tests, or procedures, documenting clinical information in the electronic health record, and independently interpreting results (not separately reported/billed) and communicating results to the patient/family/caregiver   Hose Isadora, MD [facility] 01/19/2023 10:52 AM      End of visit medications:       Meds ordered this encounter  Medications   Tiotropium  Bromide-Olodaterol (STIOLTO RESPIMAT ) 2.5-2.5 MCG/ACT AERS      Sig: Inhale 2 puffs into the lungs daily.      Dispense:  60 each      Refill:  12       Current Medications    Current Outpatient Medications:    Tiotropium Bromide-Olodaterol (STIOLTO RESPIMAT ) 2.5-2.5 MCG/ACT AERS, Inhale 2 puffs into the lungs daily., Disp: 60 each, Rfl: 12   ALPRAZolam  (XANAX ) 1 MG tablet, Take 1 mg by mouth 3 (three) times daily., Disp: , Rfl:    ARIPiprazole  (ABILIFY ) 10 MG tablet, Take 1 tablet (10 mg total) by mouth daily., Disp: 30 tablet, Rfl: 0   aspirin  81 MG EC tablet, Take 81 mg by mouth daily., Disp: , Rfl:    B Complex-C (B-COMPLEX WITH VITAMIN C) tablet, Take 1 tablet by mouth daily., Disp: , Rfl:    busPIRone  (BUSPAR ) 10 MG tablet, Take 20 mg by mouth 2 (two) times daily., Disp: , Rfl:    cariprazine  (VRAYLAR ) 3 MG capsule, Take 1 capsule (3 mg total) by mouth daily., Disp: 30 capsule, Rfl: 0   Cholecalciferol (VITAMIN D3) 50 MCG (2000 UT) capsule, Take 2,000 Units by mouth daily., Disp: , Rfl:    divalproex  (DEPAKOTE ) 250 MG DR tablet, Take 1 tablet (250 mg total) by mouth 2 (two) times daily., Disp: 60 tablet, Rfl: 0   doxycycline  (VIBRAMYCIN ) 100 MG capsule, Take 1 capsule (100 mg total) by mouth 2 (two) times daily., Disp: 20 capsule, Rfl: 0   fluticasone  (FLONASE ) 50 MCG/ACT nasal spray, Place 2 sprays into both nostrils daily., Disp: , Rfl:    glucose blood ([employer] BLOOD GLUCOSE TEST)  test strip, Use to test glucose once daily, Disp: , Rfl:    ibuprofen  (ADVIL ) 800 MG tablet, Take 1 tablet (800 mg total) by mouth every 8 (eight) hours as needed., Disp: 30 tablet, Rfl: 0   metoprolol  succinate (TOPROL -XL) 25 MG 24 hr tablet, ONE A DAY FOR SHAKINESS, Disp: , Rfl:    omeprazole  (PRILOSEC) 40 MG capsule, one a day for heartburn, Disp: , Rfl:    paliperidone  (INVEGA ) 6 MG 24 hr tablet, Take 6 mg by mouth every morning., Disp: , Rfl:    prazosin  (MINIPRESS ) 5 MG capsule, Take 1 capsule (5 mg  total) by mouth at bedtime., Disp: 30 capsule, Rfl: 0   PROAIR  HFA 108 (90 Base) MCG/ACT inhaler, Inhale 2 puffs into the lungs every 6 (six) hours as needed for wheezing or shortness of breath., Disp: , Rfl:    promethazine (PHENERGAN) 25 MG suppository, UNWRAP AND INSERT 1 SUPPOSITORY INTO RECTUM EVERY 6 HOURS TO 8 HOURS FOR NAUSEA AND VOMITNG, Disp: , Rfl:    promethazine (PHENERGAN) 25 MG tablet, TAKE 1 TABLET BY MOUTH 3 TIMES A DAY AS NEEDED FOR NAUSEA, Disp: , Rfl:    QUEtiapine  (SEROQUEL ) 25 MG tablet, Take 25-50 mg by mouth every evening., Disp: , Rfl:    rosuvastatin  (CRESTOR ) 20 MG tablet, Take 1 tablet (20 mg total) by mouth daily., Disp: 90 tablet, Rfl: 1   sucralfate (CARAFATE) 1 g tablet, TAKE 1 TABLET 4 TIMES A DAY 1 HOUR BEFORE MEALS AND AT BEDTIME, Disp: , Rfl:    traZODone  (DESYREL ) 100 MG tablet, Take 3 tablets (300 mg total) by mouth at bedtime., Disp: 90 tablet, Rfl: 0   VIIBRYD  40 MG TABS, Take 40 mg by mouth daily., Disp: , Rfl:    zolpidem  (AMBIEN ) 10 MG tablet, Take 10 mg by mouth at bedtime., Disp: , Rfl:        Subjective:    PATIENT ID: Victor Alvarez GENDER: male DOB: February 23, 1960, MRN: [id number]       Chief Complaint  Patient presents with   Consult      Cough, shortness of breath on exertion and at rest. Wheezing. Patient ran out of Spiriva a week ago.       HPI   Patient is a pleasant 63 year old male with a past medical history of diabetes, HTN, HLD, and depression/anxiety who presents to clinic for the evaluation of shortness of breath.   Patient reports long standing symptoms of shortness of breath that is mostly exertional. He does not feel said symptoms at rest. He has a reported history of COPD for which he is maintained on Spiriva. This was helpful with his symptoms but he reports running out, resulting in worsening symptoms. He has increase in his shortness of breath as well as a cough that is productive of sputum. He denies chest pain, but reports some  tightness. He also reports a wheeze.   Patient has a history of cigarette smoking, with 1 pack a day for over 50 years. He is actively smoking. He works as a visual merchandiser (tobacco, grain, farm animals).   Ancillary information including prior medications, full medical/surgical/family/social histories, and PFTs (when available) are listed below and have been reviewed.    Review of Systems  Constitutional:  Positive for malaise/fatigue. Negative for chills, diaphoresis, fever and weight loss.  Respiratory:  Positive for cough, sputum production, shortness of breath and wheezing. Negative for hemoptysis.   Cardiovascular:  Negative for chest pain and palpitations.  Objective:       Vitals:    01/19/23 1032  BP: 96/60  Pulse: 66  Temp: 97.6 F (36.4 C)  TempSrc: Temporal  SpO2: 95%  Weight: 229 lb 12.8 oz (104.2 kg)  Height: 5' 10 (1.778 m)    95% on RA    BMI Readings from Last 3 Encounters:  01/19/23 32.97 kg/m  01/18/23 32.60 kg/m  01/08/23 32.69 kg/m       Wt Readings from Last 3 Encounters:  01/19/23 229 lb 12.8 oz (104.2 kg)  01/18/23 227 lb 3.2 oz (103.1 kg)  01/08/23 227 lb 12.8 oz (103.3 kg)      Physical Exam Constitutional:      Appearance: Normal appearance.  Cardiovascular:     Rate and Rhythm: Normal rate and regular rhythm.     Pulses: Normal pulses.     Heart sounds: Normal heart sounds.  Pulmonary:     Effort: No respiratory distress.     Breath sounds: No wheezing or rhonchi.  Abdominal:     Palpations: Abdomen is soft.  Neurological:     General: No focal deficit present.     Mental Status: He is alert and oriented to person, place, and time. Mental status is at baseline.            Ancillary Information          Past Medical History:  Diagnosis Date   Allergy     Arthritis     COPD (chronic obstructive pulmonary disease) (HCC)     Depression     Neuromuscular disorder (HCC)     PTSD (post-traumatic stress disorder)     Sleep  apnea            No family history on file.             Past Surgical History:  Procedure Laterality Date   WRIST SURGERY       XI ROBOTIC LAPAROSCOPIC ASSISTED APPENDECTOMY N/A 08/24/2019    Procedure: XI ROBOTIC LAPAROSCOPIC ASSISTED APPENDECTOMY;  Surgeon: Lane Shope, MD;  Location: [facility] ORS;  Service: General;  Laterality: N/A;          Social History         Socioeconomic History   Marital status: Single      Spouse name: Not on file   Number of children: Not on file   Years of education: Not on file   Highest education level: Not on file  Occupational History   Not on file  Tobacco Use   Smoking status: Every Day      Current packs/day: 1.00      Average packs/day: 1 pack/day for 51.0 years (51.0 ttl pk-yrs)      Types: Cigarettes      Start date: 70   Smokeless tobacco: Never  Vaping Use   Vaping status: Never Used  Substance and Sexual Activity   Alcohol use: Not Currently   Drug use: Not Currently   Sexual activity: Not on file  Other Topics Concern   Not on file  Social History Narrative   Not on file    Social Drivers of Health    Financial Resource Strain: Not on file  Food Insecurity: Not on file  Transportation Needs: Not on file  Physical Activity: Not on file  Stress: Not on file  Social Connections: Not on file  Intimate Partner Violence: Not on file      Allergies      Allergies  Allergen Reactions   Codeine Nausea And Vomiting, Other (See Comments) and Nausea Only   Metformin  Other (See Comments)        CBC Labs (Brief)          Component Value Date/Time    WBC 8.2 12/10/2022 1114    WBC 12.0 (H) 07/14/2021 1657    RBC 5.39 12/10/2022 1114    RBC 5.17 07/14/2021 1657    HGB 16.3 12/10/2022 1114    HCT 50.3 12/10/2022 1114    PLT 296 12/10/2022 1114    MCV 93 12/10/2022 1114    MCH 30.2 12/10/2022 1114    MCH 29.2 07/14/2021 1657    MCHC 32.4 12/10/2022 1114    MCHC 31.5 07/14/2021 1657    RDW 12.5  12/10/2022 1114    LYMPHSABS 2.8 12/10/2022 1114    MONOABS 1.0 07/14/2021 1657    EOSABS 0.3 12/10/2022 1114    BASOSABS 0.1 12/10/2022 1114        Pulmonary Functions Testing Results:       No data to display                   Outpatient Medications Prior to Visit  Medication Sig Dispense Refill   tiotropium (SPIRIVA) 18 MCG inhalation capsule Place 18 mcg into inhaler and inhale daily.       ALPRAZolam  (XANAX ) 1 MG tablet Take 1 mg by mouth 3 (three) times daily.       ARIPiprazole  (ABILIFY ) 10 MG tablet Take 1 tablet (10 mg total) by mouth daily. 30 tablet 0   aspirin  81 MG EC tablet Take 81 mg by mouth daily.       B Complex-C (B-COMPLEX WITH VITAMIN C) tablet Take 1 tablet by mouth daily.       busPIRone  (BUSPAR ) 10 MG tablet Take 20 mg by mouth 2 (two) times daily.       cariprazine  (VRAYLAR ) 3 MG capsule Take 1 capsule (3 mg total) by mouth daily. 30 capsule 0   Cholecalciferol (VITAMIN D3) 50 MCG (2000 UT) capsule Take 2,000 Units by mouth daily.       divalproex  (DEPAKOTE ) 250 MG DR tablet Take 1 tablet (250 mg total) by mouth 2 (two) times daily. 60 tablet 0   doxycycline  (VIBRAMYCIN ) 100 MG capsule Take 1 capsule (100 mg total) by mouth 2 (two) times daily. 20 capsule 0   fluticasone  (FLONASE ) 50 MCG/ACT nasal spray Place 2 sprays into both nostrils daily.       glucose blood ([employer] BLOOD GLUCOSE TEST) test strip Use to test glucose once daily       ibuprofen  (ADVIL ) 800 MG tablet Take 1 tablet (800 mg total) by mouth every 8 (eight) hours as needed. 30 tablet 0   metoprolol  succinate (TOPROL -XL) 25 MG 24 hr tablet ONE A DAY FOR SHAKINESS       omeprazole  (PRILOSEC) 40 MG capsule one a day for heartburn       paliperidone  (INVEGA ) 6 MG 24 hr tablet Take 6 mg by mouth every morning.       prazosin  (MINIPRESS ) 5 MG capsule Take 1 capsule (5 mg total) by mouth at bedtime. 30 capsule 0   PROAIR  HFA 108 (90 Base) MCG/ACT inhaler Inhale 2 puffs into the lungs every 6 (six)  hours as needed for wheezing or shortness of breath.       promethazine (PHENERGAN) 25 MG suppository UNWRAP AND INSERT 1 SUPPOSITORY INTO RECTUM EVERY 6 HOURS TO 8  HOURS FOR NAUSEA AND VOMITNG       promethazine (PHENERGAN) 25 MG tablet TAKE 1 TABLET BY MOUTH 3 TIMES A DAY AS NEEDED FOR NAUSEA       QUEtiapine  (SEROQUEL ) 25 MG tablet Take 25-50 mg by mouth every evening.       rosuvastatin  (CRESTOR ) 20 MG tablet Take 1 tablet (20 mg total) by mouth daily. 90 tablet 1   sucralfate (CARAFATE) 1 g tablet TAKE 1 TABLET 4 TIMES A DAY 1 HOUR BEFORE MEALS AND AT BEDTIME       traZODone  (DESYREL ) 100 MG tablet Take 3 tablets (300 mg total) by mouth at bedtime. 90 tablet 0   VIIBRYD  40 MG TABS Take 40 mg by mouth daily.       zolpidem  (AMBIEN ) 10 MG tablet Take 10 mg by mouth at bedtime.          No facility-administered medications prior to visit.             Electronically signed by Isadora Hose, MD at 01/20/2023  5:13 PM     Office Visit on 01/19/2023       Detailed Report      Note viewed by patient Additional Documentation  Vitals: BP 96/60 (BP Location: Left Arm, Patient Position: Sitting, Cuff Size: Normal)   Pulse 66   Temp 97.6 F (36.4 C) (Temporal)   Ht 5' 10 (1.778 m)   Wt 104.2 kg   SpO2 95%   BMI 32.97 kg/m   BSA 2.27 m  Flowsheets: NEWS,   MEWS Score,   Vital Signs,   Vital Signs,   Anthropometrics  Encounter Info: Billing Info,   History,   Allergies,   Detailed Report

## 2023-02-09 ENCOUNTER — Telehealth: Payer: Self-pay

## 2023-02-09 MED ORDER — ARIPIPRAZOLE 10 MG PO TABS: 10.0000 mg | ORAL_TABLET | Freq: Every day | ORAL | 0 refills | Status: DC

## 2023-02-09 NOTE — Telephone Encounter (Signed)
 Needs to check with his [facility] specialist. A courtesy refill

## 2023-02-09 NOTE — Telephone Encounter (Signed)
 Patient is notified of courtesy fill and that he needs to contact [facility] specialist for future refills.

## 2023-02-09 NOTE — Telephone Encounter (Signed)
 Copied from CRM [id number]. Topic: Referral - Request for Referral >> Feb 09, 2023  1:31 PM Rosaria BRAVO wrote: Did the patient discuss referral with their provider in the last year? Yes (If No - schedule appointment) (If Yes - send message)  Appointment offered? No  Type of order/referral and detailed reason for visit: Behavioral Health   Preference of office, provider, location: Highest Recommended in network [facility] (Local)  If referral order, have you been seen by this specialty before? Yes (If Yes, this issue or another issue? When? Where? In [place]   Can we respond through MyChart? Yes    Pt says that PCP wants him to see a behavioral health specialist

## 2023-02-10 ENCOUNTER — Ambulatory Visit: Payer: MEDICAID | Admitting: Certified Registered"

## 2023-02-10 ENCOUNTER — Other Ambulatory Visit: Payer: Self-pay

## 2023-02-10 ENCOUNTER — Encounter: Payer: Self-pay | Admitting: Surgery

## 2023-02-10 ENCOUNTER — Encounter
Admission: RE | Disposition: A | Discharge status: Home / Self Care | Payer: Self-pay | Source: Home / Self Care | Attending: Surgery

## 2023-02-10 ENCOUNTER — Ambulatory Visit
Admission: RE | Admit: 2023-02-10 | Discharge: 2023-02-10 | Disposition: A | Discharge status: Home / Self Care | Payer: MEDICAID | Attending: Surgery | Admitting: Surgery

## 2023-02-10 DIAGNOSIS — D120 Benign neoplasm of cecum: Secondary | ICD-10-CM | POA: Diagnosis not present

## 2023-02-10 DIAGNOSIS — K648 Other hemorrhoids: Secondary | ICD-10-CM | POA: Diagnosis not present

## 2023-02-10 DIAGNOSIS — A630 Anogenital (venereal) warts: Secondary | ICD-10-CM | POA: Diagnosis not present

## 2023-02-10 DIAGNOSIS — K644 Residual hemorrhoidal skin tags: Secondary | ICD-10-CM | POA: Diagnosis not present

## 2023-02-10 DIAGNOSIS — Z01812 Encounter for preprocedural laboratory examination: Secondary | ICD-10-CM

## 2023-02-10 DIAGNOSIS — J449 Chronic obstructive pulmonary disease, unspecified: Secondary | ICD-10-CM

## 2023-02-10 DIAGNOSIS — K621 Rectal polyp: Secondary | ICD-10-CM

## 2023-02-10 DIAGNOSIS — K641 Second degree hemorrhoids: Secondary | ICD-10-CM | POA: Diagnosis not present

## 2023-02-10 DIAGNOSIS — Z01810 Encounter for preprocedural cardiovascular examination: Secondary | ICD-10-CM

## 2023-02-10 DIAGNOSIS — K620 Anal polyp: Secondary | ICD-10-CM | POA: Diagnosis not present

## 2023-02-10 HISTORY — PX: MINOR FULGERATION OF ANAL CONDYLOMA: SHX6467

## 2023-02-10 HISTORY — PX: RECTAL EXAM UNDER ANESTHESIA: SHX6399

## 2023-02-10 HISTORY — PX: HEMORRHOID SURGERY: SHX153

## 2023-02-10 LAB — GLUCOSE, CAPILLARY
Glucose-Capillary: 106 mg/dL — ABNORMAL HIGH (ref 70–99)
Glucose-Capillary: 214 mg/dL — ABNORMAL HIGH (ref 70–99)

## 2023-02-10 SURGERY — EXAM UNDER ANESTHESIA, RECTUM
Anesthesia: General | Site: Rectum

## 2023-02-10 MED ORDER — GABAPENTIN 300 MG PO CAPS
ORAL_CAPSULE | ORAL | Status: AC
  Filled 2023-02-10: qty 1

## 2023-02-10 MED ORDER — CHLORHEXIDINE GLUCONATE CLOTH 2 % EX PADS
6.0000 | MEDICATED_PAD | Freq: Once | CUTANEOUS | Status: AC
  Administered 2023-02-10: 6 via TOPICAL

## 2023-02-10 MED ORDER — OXYCODONE HCL 5 MG/5ML PO SOLN: 5.0000 mg | Freq: Once | ORAL | Status: DC | PRN

## 2023-02-10 MED ORDER — BUPIVACAINE-EPINEPHRINE (PF) 0.25% -1:200000 IJ SOLN
INTRAMUSCULAR | Status: AC
  Filled 2023-02-10: qty 30

## 2023-02-10 MED ORDER — MIDAZOLAM HCL 2 MG/2ML IJ SOLN
INTRAMUSCULAR | Status: DC | PRN
  Administered 2023-02-10: 2 mg via INTRAVENOUS

## 2023-02-10 MED ORDER — DEXAMETHASONE SODIUM PHOSPHATE 10 MG/ML IJ SOLN
INTRAMUSCULAR | Status: DC | PRN
  Administered 2023-02-10: 10 mg via INTRAVENOUS

## 2023-02-10 MED ORDER — ONDANSETRON HCL 4 MG/2ML IJ SOLN
INTRAMUSCULAR | Status: DC | PRN
  Administered 2023-02-10 (×2): 4 mg via INTRAVENOUS

## 2023-02-10 MED ORDER — MIDAZOLAM HCL 2 MG/2ML IJ SOLN
INTRAMUSCULAR | Status: AC
  Filled 2023-02-10: qty 2

## 2023-02-10 MED ORDER — BUPIVACAINE LIPOSOME 1.3 % IJ SUSP
INTRAMUSCULAR | Status: AC
  Filled 2023-02-10: qty 20

## 2023-02-10 MED ORDER — CHLORHEXIDINE GLUCONATE 0.12 % MT SOLN
OROMUCOSAL | Status: AC
  Filled 2023-02-10: qty 15

## 2023-02-10 MED ORDER — ALBUTEROL SULFATE HFA 108 (90 BASE) MCG/ACT IN AERS
INHALATION_SPRAY | RESPIRATORY_TRACT | Status: DC | PRN
  Administered 2023-02-10: 4 via RESPIRATORY_TRACT

## 2023-02-10 MED ORDER — CELECOXIB 200 MG PO CAPS
ORAL_CAPSULE | ORAL | Status: AC
  Filled 2023-02-10: qty 2

## 2023-02-10 MED ORDER — LIDOCAINE HCL (CARDIAC) PF 100 MG/5ML IV SOSY
PREFILLED_SYRINGE | INTRAVENOUS | Status: DC | PRN
  Administered 2023-02-10: 100 mg via INTRAVENOUS

## 2023-02-10 MED ORDER — HYDROCODONE-ACETAMINOPHEN 5-325 MG PO TABS: 1.0000 | ORAL_TABLET | Freq: Four times a day (QID) | ORAL | 0 refills | Status: DC | PRN

## 2023-02-10 MED ORDER — BUPIVACAINE-EPINEPHRINE 0.25% -1:200000 IJ SOLN
INTRAMUSCULAR | Status: DC | PRN
  Administered 2023-02-10: 30 mL

## 2023-02-10 MED ORDER — CELECOXIB 200 MG PO CAPS
200.0000 mg | ORAL_CAPSULE | ORAL | Status: AC
  Administered 2023-02-10: 200 mg via ORAL

## 2023-02-10 MED ORDER — ROCURONIUM BROMIDE 100 MG/10ML IV SOLN
INTRAVENOUS | Status: DC | PRN
  Administered 2023-02-10: 40 mg via INTRAVENOUS
  Administered 2023-02-10: 10 mg via INTRAVENOUS

## 2023-02-10 MED ORDER — ACETAMINOPHEN 500 MG PO TABS
ORAL_TABLET | ORAL | Status: AC
  Filled 2023-02-10: qty 2

## 2023-02-10 MED ORDER — CEFAZOLIN SODIUM-DEXTROSE 2-4 GM/100ML-% IV SOLN
INTRAVENOUS | Status: AC
  Filled 2023-02-10: qty 100

## 2023-02-10 MED ORDER — PROPOFOL 10 MG/ML IV BOLUS
INTRAVENOUS | Status: DC | PRN
  Administered 2023-02-10: 150 mg via INTRAVENOUS

## 2023-02-10 MED ORDER — CHLORHEXIDINE GLUCONATE 0.12 % MT SOLN
15.0000 mL | Freq: Once | OROMUCOSAL | Status: AC
  Administered 2023-02-10: 15 mL via OROMUCOSAL

## 2023-02-10 MED ORDER — GABAPENTIN 300 MG PO CAPS
300.0000 mg | ORAL_CAPSULE | ORAL | Status: AC
  Administered 2023-02-10: 300 mg via ORAL

## 2023-02-10 MED ORDER — FENTANYL CITRATE (PF) 100 MCG/2ML IJ SOLN
INTRAMUSCULAR | Status: DC | PRN
  Administered 2023-02-10: 100 ug via INTRAVENOUS
  Administered 2023-02-10 (×2): 50 ug via INTRAVENOUS

## 2023-02-10 MED ORDER — SODIUM CHLORIDE 0.9 % IV SOLN: INTRAVENOUS | Status: DC

## 2023-02-10 MED ORDER — FENTANYL CITRATE (PF) 100 MCG/2ML IJ SOLN
INTRAMUSCULAR | Status: AC
  Filled 2023-02-10: qty 2

## 2023-02-10 MED ORDER — FENTANYL CITRATE (PF) 100 MCG/2ML IJ SOLN: 25.0000 ug | INTRAMUSCULAR | Status: DC | PRN

## 2023-02-10 MED ORDER — GELATIN ABSORBABLE 12-7 MM EX MISC
CUTANEOUS | Status: DC | PRN
  Administered 2023-02-10: 3 via TOPICAL

## 2023-02-10 MED ORDER — FLEET ENEMA RE ENEM
1.0000 | ENEMA | Freq: Once | RECTAL | Status: DC
Start: 2023-02-11 — End: 2023-02-10

## 2023-02-10 MED ORDER — GELATIN ABSORBABLE 12-7 MM EX MISC
CUTANEOUS | Status: AC
  Filled 2023-02-10: qty 1

## 2023-02-10 MED ORDER — CEFAZOLIN SODIUM-DEXTROSE 2-3 GM-%(50ML) IV SOLR
INTRAVENOUS | Status: DC | PRN
  Administered 2023-02-10: 2 g via INTRAVENOUS

## 2023-02-10 MED ORDER — ORAL CARE MOUTH RINSE: 15.0000 mL | Freq: Once | OROMUCOSAL | Status: AC

## 2023-02-10 MED ORDER — ONDANSETRON HCL 4 MG/2ML IJ SOLN: 4.0000 mg | Freq: Once | INTRAMUSCULAR | Status: DC | PRN

## 2023-02-10 MED ORDER — ACETAMINOPHEN 500 MG PO TABS
1000.0000 mg | ORAL_TABLET | ORAL | Status: AC
  Administered 2023-02-10: 1000 mg via ORAL

## 2023-02-10 MED ORDER — BUPIVACAINE LIPOSOME 1.3 % IJ SUSP: 20.0000 mL | Freq: Once | INTRAMUSCULAR | Status: DC

## 2023-02-10 MED ORDER — OXYCODONE HCL 5 MG PO TABS: 5.0000 mg | ORAL_TABLET | Freq: Once | ORAL | Status: DC | PRN

## 2023-02-10 MED ORDER — ESMOLOL HCL 100 MG/10ML IV SOLN
INTRAVENOUS | Status: DC | PRN
  Administered 2023-02-10: 10 mg via INTRAVENOUS

## 2023-02-10 MED ORDER — FLEET ENEMA RE ENEM
1.0000 | ENEMA | Freq: Once | RECTAL | Status: AC
  Administered 2023-02-10: 1 via RECTAL

## 2023-02-10 MED ORDER — GLYCOPYRROLATE 0.2 MG/ML IJ SOLN
INTRAMUSCULAR | Status: DC | PRN
  Administered 2023-02-10: .2 mg via INTRAVENOUS

## 2023-02-10 MED ORDER — SUGAMMADEX SODIUM 200 MG/2ML IV SOLN
INTRAVENOUS | Status: DC | PRN
  Administered 2023-02-10: 200 mg via INTRAVENOUS

## 2023-02-10 MED ORDER — BUPIVACAINE LIPOSOME 1.3 % IJ SUSP
INTRAMUSCULAR | Status: DC | PRN
  Administered 2023-02-10: 20 mL

## 2023-02-10 MED ORDER — IBUPROFEN 800 MG PO TABS: 800.0000 mg | ORAL_TABLET | Freq: Three times a day (TID) | ORAL | 0 refills | Status: AC | PRN

## 2023-02-10 SURGICAL SUPPLY — 34 items
ADHESIVE MASTISOL STRL (MISCELLANEOUS) IMPLANT
BLADE SURG 15 STRL LF DISP TIS (BLADE) ×1 IMPLANT
BRIEF MESH DISP 2XL (UNDERPADS AND DIAPERS) ×1 IMPLANT
DISSECTOR SURG LIGASURE 21 (MISCELLANEOUS) IMPLANT
DRAPE LAPAROTOMY 100X77 ABD (DRAPES) ×1 IMPLANT
DRAPE LEGGINS SURG 28X43 STRL (DRAPES) ×1 IMPLANT
DRSG GAUZE FLUFF 36X18 (GAUZE/BANDAGES/DRESSINGS) ×1 IMPLANT
ELECT CAUTERY BLADE TIP 2.5 (TIP) ×1
ELECT REM PT RETURN 9FT ADLT (ELECTROSURGICAL) ×1
ELECTRODE CAUTERY BLDE TIP 2.5 (TIP) ×1 IMPLANT
ELECTRODE REM PT RTRN 9FT ADLT (ELECTROSURGICAL) ×1 IMPLANT
GAUZE 4X4 16PLY ~~LOC~~+RFID DBL (SPONGE) ×1 IMPLANT
GLOVE ORTHO TXT STRL SZ7.5 (GLOVE) ×1 IMPLANT
GOWN STRL REUS W/ TWL LRG LVL3 (GOWN DISPOSABLE) ×1 IMPLANT
GOWN STRL REUS W/ TWL XL LVL3 (GOWN DISPOSABLE) ×1 IMPLANT
KIT TURNOVER KIT A (KITS) ×1 IMPLANT
MANIFOLD NEPTUNE II (INSTRUMENTS) ×1 IMPLANT
NDL HYPO 22X1.5 SAFETY MO (MISCELLANEOUS) ×1 IMPLANT
NDL SAFETY ECLIPSE 18X1.5 (NEEDLE) ×1 IMPLANT
NEEDLE HYPO 22X1.5 SAFETY MO (MISCELLANEOUS) ×1
PACK BASIN MINOR ARMC (MISCELLANEOUS) ×1 IMPLANT
PAD ABD DERMACEA PRESS 5X9 (GAUZE/BANDAGES/DRESSINGS) IMPLANT
SOL PREP PVP 2OZ (MISCELLANEOUS) ×1
SOLUTION PREP PVP 2OZ (MISCELLANEOUS) ×1 IMPLANT
SPONGE SURGIFOAM ABS GEL 12-7 (HEMOSTASIS) IMPLANT
SURGILUBE 2OZ TUBE FLIPTOP (MISCELLANEOUS) ×1 IMPLANT
SUT CHROMIC 3 0 SH 27 (SUTURE) ×1 IMPLANT
SUT PROLENE 2 0 SH DA (SUTURE) IMPLANT
SUT PROLENE 3 0 SH DA (SUTURE) IMPLANT
SWABSTK COMLB BENZOIN TINCTURE (MISCELLANEOUS) ×1 IMPLANT
SYR 10ML LL (SYRINGE) ×1 IMPLANT
TAPE CLOTH 3X10 WHT NS LF (GAUZE/BANDAGES/DRESSINGS) ×2 IMPLANT
TRAP FLUID SMOKE EVACUATOR (MISCELLANEOUS) ×1 IMPLANT
WATER STERILE IRR 500ML POUR (IV SOLUTION) ×1 IMPLANT

## 2023-02-10 NOTE — Interval H&P Note (Signed)
 History and Physical Interval Note:  02/10/2023 12:40 PM  [name]  has presented today for surgery, with the diagnosis of anal condyloma.  The various methods of treatment have been discussed with the patient and family. After consideration of risks, benefits and other options for treatment, the patient has consented to  Procedure(s): RECTAL EXAM UNDER ANESTHESIA (N/A) HEMORRHOIDECTOMY (N/A) as a surgical intervention.  The patient's history has been reviewed, patient examined, no change in status, stable for surgery.  I have reviewed the patient's chart and labs.  Questions were answered to the patient's satisfaction.     Victor Alvarez

## 2023-02-10 NOTE — Op Note (Signed)
 Rectal examination under anesthesia, internal and external hemorrhoidectomy of 2 or more columns, excision of sessile rectal polyp, destruction of residual anal condylomata.   Pre-operative Diagnosis: Grade 2 internal and external hemorrhoids, anal condylomata, sessile distal rectal polyps.  Post-operative Diagnosis: same.    Surgeon: Honor Leghorn, M.D., [facility]  Anesthesia: General Endotracheal  Findings: Sessile polypoid lesions, primarily over 2 areas of the distal rectum, some intervening smaller lesions consistent with anal condyloma, extensive circumferential grade 2 internal and external hemorrhoids.    Estimated Blood Loss: 120 mL         Specimens: Distal rectal polyps, internal and external hemorrhoids, anal condyloma.          Complications: none              Procedure Details  The patient was seen again in the Holding Room. The benefits, complications, treatment options, and expected outcomes were discussed with the patient. The risks of bleeding, infection, recurrence of symptoms, failure to resolve symptoms, unanticipated injury, prosthetic placement, prosthetic infection, any of which could require further surgery were reviewed with the patient. The likelihood of improving the patient's symptoms with return to their baseline status is anticipated.  The patient and/or family concurred with the proposed plan, giving informed consent.  The patient was taken to Operating Room, identified and the procedure verified.    Prior to the induction of general anesthesia, antibiotic prophylaxis was administered. VTE prophylaxis was in place.  General anesthesia was then administered and tolerated well. After the induction, the patient was positioned in the prone/jackknife position and the anal region was prepped with Betadine and draped in the sterile fashion.  A Time Out was held and the above information confirmed.  Digital anal rectal exam completed, confirming the presence of the  distal rectal sessile masses noted at approximately 4:30, and 8:00, where 6:00 is anterior and 12:00 is toward coccyx.  Between these major lesions, there are smaller lesions more resembling anal condylomata.  He has significant hemorrhoidal tissue primarily adjacent to both of these 2 lesions, there are essentially 3 primary piles of internal hemorrhoidal tissue with extension externally. With this completed local infiltration of quarter percent Marcaine  with epinephrine  is completed.  I approached the distal rectal polyps with a hemorrhoidal approach, incising the external hemorrhoid externally at 4:00, and at 8:00.  Then proceeded with utilizing the exact LigaSure, to provide dissection between the hemorrhoidal/distal rectal polyp and the underlying internal anal sphincter.  We completed dissection mobilizing this tissues from the underlying sphincter muscle, hemostasis was somewhat challenging due to the extensive degree of hemorrhoidal vasculature in this area.  In combination with electrocautery were able to obtain adequate hemostasis.  No sutures were necessary.  After completing this it was noted there is an additional anal condylomata between these areas.  These were scored with electrocautery obtaining destruction of these apparent lesions.  At 2:00 on the patient's right there was a rather large apparent mass like internal polyp, and this was excised with the LigaSure exact as well.  With adequate hemostasis, the wound was irrigated with multiple volumes of normal saline solution.  Reexamined on multiple occasions to ensure adequate hemostasis.  The region was then infiltrated with the remaining quarter percent Marcaine  with epinephrine  mixed with 20 mL of Exparel .  I utilized 3 small pieces of Gelfoam, placing him in the distal rectum.  Another inspection confirmed that there was adequate hemostasis.  Fluffs were then applied over the anal region, followed by ABD and secured  with tape and mesh  briefs.  Patient was subsequently turned to a supine position extubated and transferred to PACU in stable condition.      Honor Leghorn M.D., [facility] 02/10/2023 2:58 PM

## 2023-02-10 NOTE — Transfer of Care (Signed)
 Immediate Anesthesia Transfer of Care Note  Patient: Victor Alvarez  Procedure(s) Performed: RECTAL EXAM UNDER ANESTHESIA HEMORRHOIDECTOMY (Rectum) MINOR FULGERATION OF ANAL CONDYLOMA  Patient Location: PACU  Anesthesia Type:General  Level of Consciousness: awake, drowsy, and patient cooperative  Airway & Oxygen Therapy: Patient Spontanous Breathing and Patient connected to face mask oxygen  Post-op Assessment: Report given to RN and Post -op Vital signs reviewed and stable  Post vital signs: Reviewed and stable  Last Vitals:  Vitals Value Taken Time  BP 145/68 02/10/23 1455  Temp    Pulse 88 02/10/23 1500  Resp 19 02/10/23 1500  SpO2 98 % 02/10/23 1500  Vitals shown include unfiled device data.  Last Pain:  Vitals:   02/10/23 1232  PainSc: 0-No pain         Complications: No notable events documented.

## 2023-02-10 NOTE — Anesthesia Procedure Notes (Signed)
 Procedure Name: Intubation Date/Time: 02/10/2023 1:14 PM  Performed by: Ledora Duncan, CRNAPre-anesthesia Checklist: Patient identified, Emergency Drugs available, Suction available and Patient being monitored Patient Re-evaluated:Patient Re-evaluated prior to induction Oxygen Delivery Method: Circle system utilized Preoxygenation: Pre-oxygenation with 100% oxygen Induction Type: IV induction Ventilation: Two handed mask ventilation required and Mask ventilation without difficulty Laryngoscope Size: McGrath and 3 Tube type: Oral Tube size: 7.0 mm Number of attempts: 1 Airway Equipment and Method: Stylet Placement Confirmation: ETT inserted through vocal cords under direct vision, positive ETCO2 and breath sounds checked- equal and bilateral Secured at: 21 cm Tube secured with: Tape Dental Injury: Teeth and Oropharynx as per pre-operative assessment

## 2023-02-10 NOTE — Anesthesia Preprocedure Evaluation (Signed)
 Anesthesia Evaluation  Patient identified by MRN, date of birth, ID band Patient awake    Reviewed: Allergy & Precautions, NPO status , Patient's Chart, lab work & pertinent test results  History of Anesthesia Complications Negative for: history of anesthetic complications  Airway Mallampati: II  TM Distance: >3 FB Neck ROM: Full    Dental  (+) Teeth Intact   Pulmonary sleep apnea , COPD, Current Smoker and Patient abstained from smoking.   Pulmonary exam normal breath sounds clear to auscultation       Cardiovascular Exercise Tolerance: Good METS(-) hypertension(-) CAD and (-) Past MI negative cardio ROS (-) dysrhythmias  Rhythm:Regular Rate:Normal - Systolic murmurs    Neuro/Psych  PSYCHIATRIC DISORDERS Anxiety Depression Bipolar Disorder   negative neurological ROS     GI/Hepatic ,neg GERD  ,,(+)     (-) substance abuse    Endo/Other  diabetes, Well Controlled    Renal/GU negative Renal ROS     Musculoskeletal   Abdominal  (+) + obese  Peds  Hematology   Anesthesia Other Findings Past Medical History: 08/24/2019: Acute appendicitis No date: Allergy No date: Arthritis No date: Bipolar affective disorder (HCC) No date: Colon polyp No date: COPD (chronic obstructive pulmonary disease) (HCC) No date: Depression No date: GERD (gastroesophageal reflux disease) No date: Hemorrhoids No date: Hypercholesterolemia No date: Mixed hyperlipidemia No date: Neuromuscular disorder (HCC) No date: PTSD (post-traumatic stress disorder) 03/25/2015: Right wrist fracture No date: Sleep apnea No date: Type 2 diabetes mellitus without complication, without long- term current use of insulin  (HCC)  Reproductive/Obstetrics                             Anesthesia Physical Anesthesia Plan  ASA: 2  Anesthesia Plan: General   Post-op Pain Management: Tylenol  PO (pre-op)*, Celebrex  PO (pre-op)* and  Gabapentin  PO (pre-op)*   Induction: Intravenous  PONV Risk Score and Plan: 1 and Ondansetron  and Dexamethasone   Airway Management Planned: Oral ETT and Video Laryngoscope Planned  Additional Equipment: None  Intra-op Plan:   Post-operative Plan: Extubation in OR  Informed Consent: I have reviewed the patients History and Physical, chart, labs and discussed the procedure including the risks, benefits and alternatives for the proposed anesthesia with the patient or authorized representative who has indicated his/her understanding and acceptance.     Dental advisory given  Plan Discussed with: CRNA and Surgeon  Anesthesia Plan Comments: (Discussed risks of anesthesia with patient, including PONV, sore throat, lip/dental/eye damage. Rare risks discussed as well, such as cardiorespiratory and neurological sequelae, and allergic reactions. Discussed the role of CRNA in patient's perioperative care. Patient understands. Patient counseled on benefits of smoking cessation, and increased perioperative risks associated with continued smoking. )       Anesthesia Quick Evaluation

## 2023-02-11 ENCOUNTER — Encounter: Payer: Self-pay | Admitting: Surgery

## 2023-02-11 ENCOUNTER — Other Ambulatory Visit: Payer: Self-pay | Admitting: Physician Assistant

## 2023-02-11 LAB — SURGICAL PATHOLOGY

## 2023-02-11 NOTE — Anesthesia Postprocedure Evaluation (Signed)
 Anesthesia Post Note  Patient: Shashank Kwasnik  Procedure(s) Performed: RECTAL EXAM UNDER ANESTHESIA (Rectum) HEMORRHOIDECTOMY (Rectum) MINOR FULGERATION OF ANAL CONDYLOMA (Rectum)  Patient location during evaluation: PACU Anesthesia Type: General Level of consciousness: awake and alert Pain management: pain level controlled Vital Signs Assessment: post-procedure vital signs reviewed and stable Respiratory status: spontaneous breathing, nonlabored ventilation, respiratory function stable and patient connected to nasal cannula oxygen Cardiovascular status: blood pressure returned to baseline and stable Postop Assessment: no apparent nausea or vomiting Anesthetic complications: no   No notable events documented.   Last Vitals:  Vitals:   02/10/23 1545 02/10/23 1556  BP:  107/76  Pulse: 77 82  Resp: (!) 23 14  Temp:  (!) 36.1 C  SpO2: 93% 96%    Last Pain:  Vitals:   02/10/23 1556  TempSrc: Temporal  PainSc: 0-No pain                 Rome Ade

## 2023-02-16 ENCOUNTER — Other Ambulatory Visit: Payer: Self-pay

## 2023-02-16 ENCOUNTER — Inpatient Hospital Stay: Payer: MEDICAID

## 2023-02-16 ENCOUNTER — Emergency Department: Payer: MEDICAID | Admitting: Anesthesiology

## 2023-02-16 ENCOUNTER — Inpatient Hospital Stay
Admission: EM | Admit: 2023-02-16 | Discharge: 2023-02-18 | DRG: 907 | Disposition: A | Discharge status: Home / Self Care | Payer: MEDICAID | Attending: Surgery | Admitting: Surgery

## 2023-02-16 ENCOUNTER — Encounter
Admission: EM | Disposition: A | Discharge status: Home / Self Care | Payer: Self-pay | Source: Home / Self Care | Attending: Surgery

## 2023-02-16 DIAGNOSIS — Z79899 Other long term (current) drug therapy: Secondary | ICD-10-CM | POA: Diagnosis not present

## 2023-02-16 DIAGNOSIS — Z9841 Cataract extraction status, right eye: Secondary | ICD-10-CM | POA: Diagnosis not present

## 2023-02-16 DIAGNOSIS — Z888 Allergy status to other drugs, medicaments and biological substances status: Secondary | ICD-10-CM | POA: Diagnosis not present

## 2023-02-16 DIAGNOSIS — K922 Gastrointestinal hemorrhage, unspecified: Secondary | ICD-10-CM | POA: Diagnosis present

## 2023-02-16 DIAGNOSIS — E782 Mixed hyperlipidemia: Secondary | ICD-10-CM | POA: Diagnosis present

## 2023-02-16 DIAGNOSIS — G4733 Obstructive sleep apnea (adult) (pediatric): Secondary | ICD-10-CM | POA: Diagnosis present

## 2023-02-16 DIAGNOSIS — E119 Type 2 diabetes mellitus without complications: Secondary | ICD-10-CM | POA: Diagnosis present

## 2023-02-16 DIAGNOSIS — Z885 Allergy status to narcotic agent status: Secondary | ICD-10-CM

## 2023-02-16 DIAGNOSIS — R578 Other shock: Secondary | ICD-10-CM

## 2023-02-16 DIAGNOSIS — F17210 Nicotine dependence, cigarettes, uncomplicated: Secondary | ICD-10-CM | POA: Diagnosis present

## 2023-02-16 DIAGNOSIS — K219 Gastro-esophageal reflux disease without esophagitis: Secondary | ICD-10-CM | POA: Diagnosis present

## 2023-02-16 DIAGNOSIS — Z7982 Long term (current) use of aspirin: Secondary | ICD-10-CM

## 2023-02-16 DIAGNOSIS — I10 Essential (primary) hypertension: Secondary | ICD-10-CM | POA: Diagnosis present

## 2023-02-16 DIAGNOSIS — Z860100 Personal history of colon polyps, unspecified: Secondary | ICD-10-CM | POA: Diagnosis not present

## 2023-02-16 DIAGNOSIS — F419 Anxiety disorder, unspecified: Secondary | ICD-10-CM | POA: Diagnosis present

## 2023-02-16 DIAGNOSIS — F319 Bipolar disorder, unspecified: Secondary | ICD-10-CM | POA: Diagnosis present

## 2023-02-16 DIAGNOSIS — Z961 Presence of intraocular lens: Secondary | ICD-10-CM | POA: Diagnosis present

## 2023-02-16 DIAGNOSIS — J449 Chronic obstructive pulmonary disease, unspecified: Secondary | ICD-10-CM | POA: Diagnosis present

## 2023-02-16 DIAGNOSIS — Y838 Other surgical procedures as the cause of abnormal reaction of the patient, or of later complication, without mention of misadventure at the time of the procedure: Secondary | ICD-10-CM | POA: Diagnosis present

## 2023-02-16 DIAGNOSIS — F4310 Post-traumatic stress disorder, unspecified: Secondary | ICD-10-CM | POA: Diagnosis present

## 2023-02-16 DIAGNOSIS — D72829 Elevated white blood cell count, unspecified: Secondary | ICD-10-CM | POA: Diagnosis not present

## 2023-02-16 DIAGNOSIS — K625 Hemorrhage of anus and rectum: Principal | ICD-10-CM

## 2023-02-16 DIAGNOSIS — K91840 Postprocedural hemorrhage and hematoma of a digestive system organ or structure following a digestive system procedure: Secondary | ICD-10-CM | POA: Diagnosis present

## 2023-02-16 HISTORY — PX: RECTAL EXAM UNDER ANESTHESIA: SHX6399

## 2023-02-16 HISTORY — DX: Other shock: R57.8

## 2023-02-16 LAB — CBC WITH DIFFERENTIAL/PLATELET
Abs Immature Granulocytes: 0.17 10*3/uL — ABNORMAL HIGH (ref 0.00–0.07)
Basophils Absolute: 0.1 10*3/uL (ref 0.0–0.1)
Basophils Relative: 1 %
Eosinophils Absolute: 0.3 10*3/uL (ref 0.0–0.5)
Eosinophils Relative: 3 %
HCT: 37.4 % — ABNORMAL LOW (ref 39.0–52.0)
Hemoglobin: 12.1 g/dL — ABNORMAL LOW (ref 13.0–17.0)
Immature Granulocytes: 2 %
Lymphocytes Relative: 24 %
Lymphs Abs: 2.5 10*3/uL (ref 0.7–4.0)
MCH: 30.9 pg (ref 26.0–34.0)
MCHC: 32.4 g/dL (ref 30.0–36.0)
MCV: 95.4 fL (ref 80.0–100.0)
Monocytes Absolute: 0.9 10*3/uL (ref 0.1–1.0)
Monocytes Relative: 9 %
Neutro Abs: 6.4 10*3/uL (ref 1.7–7.7)
Neutrophils Relative %: 61 %
Platelets: 293 10*3/uL (ref 150–400)
RBC: 3.92 MIL/uL — ABNORMAL LOW (ref 4.22–5.81)
RDW: 13.2 % (ref 11.5–15.5)
WBC: 10.5 10*3/uL (ref 4.0–10.5)
nRBC: 0 % (ref 0.0–0.2)

## 2023-02-16 LAB — COMPREHENSIVE METABOLIC PANEL
ALT: 11 U/L (ref 0–44)
AST: 11 U/L — ABNORMAL LOW (ref 15–41)
Albumin: 3 g/dL — ABNORMAL LOW (ref 3.5–5.0)
Alkaline Phosphatase: 55 U/L (ref 38–126)
Anion gap: 11 (ref 5–15)
BUN: 7 mg/dL — ABNORMAL LOW (ref 8–23)
CO2: 26 mmol/L (ref 22–32)
Calcium: 8.2 mg/dL — ABNORMAL LOW (ref 8.9–10.3)
Chloride: 101 mmol/L (ref 98–111)
Creatinine, Ser: 1.07 mg/dL (ref 0.61–1.24)
GFR, Estimated: >60 mL/min (ref >60)
Glucose, Bld: 195 mg/dL — ABNORMAL HIGH (ref 70–99)
Potassium: 4.4 mmol/L (ref 3.5–5.1)
Sodium: 138 mmol/L (ref 135–145)
Total Bilirubin: 0.6 mg/dL (ref 0.0–1.2)
Total Protein: 5.5 g/dL — ABNORMAL LOW (ref 6.5–8.1)

## 2023-02-16 LAB — PROTIME-INR
INR: 1 (ref 0.8–1.2)
Prothrombin Time: 13.6 s (ref 11.4–15.2)

## 2023-02-16 LAB — POCT I-STAT, CHEM 8
BUN: 6 mg/dL — ABNORMAL LOW (ref 8–23)
Calcium, Ion: 1.24 mmol/L (ref 1.15–1.40)
Chloride: 105 mmol/L (ref 98–111)
Creatinine, Ser: 1 mg/dL (ref 0.61–1.24)
Glucose, Bld: 176 mg/dL — ABNORMAL HIGH (ref 70–99)
HCT: 33 % — ABNORMAL LOW (ref 39.0–52.0)
Hemoglobin: 11.2 g/dL — ABNORMAL LOW (ref 13.0–17.0)
Potassium: 4.4 mmol/L (ref 3.5–5.1)
Sodium: 141 mmol/L (ref 135–145)
TCO2: 24 mmol/L (ref 22–32)

## 2023-02-16 LAB — DIC (DISSEMINATED INTRAVASCULAR COAGULATION)PANEL
D-Dimer, Quant: 0.37 ug{FEU}/mL (ref 0.00–0.50)
Fibrinogen: 348 mg/dL (ref 210–475)
INR: 1.1 (ref 0.8–1.2)
Platelets: 247 10*3/uL (ref 150–400)
Prothrombin Time: 14.4 s (ref 11.4–15.2)
Smear Review: NONE SEEN
aPTT: 26 s (ref 24–36)

## 2023-02-16 LAB — GLUCOSE, CAPILLARY: Glucose-Capillary: 203 mg/dL — ABNORMAL HIGH (ref 70–99)

## 2023-02-16 LAB — ABO/RH: ABO/RH(D): O POS

## 2023-02-16 SURGERY — EXAM UNDER ANESTHESIA, RECTUM
Anesthesia: General | Site: Rectum

## 2023-02-16 MED ORDER — SODIUM CHLORIDE 0.9 % IV BOLUS
1000.0000 mL | Freq: Once | INTRAVENOUS | Status: AC
  Administered 2023-02-16: 1000 mL via INTRAVENOUS

## 2023-02-16 MED ORDER — PROPOFOL 10 MG/ML IV BOLUS
INTRAVENOUS | Status: DC | PRN
  Administered 2023-02-16: 80 mg via INTRAVENOUS

## 2023-02-16 MED ORDER — ROCURONIUM BROMIDE 10 MG/ML (PF) SYRINGE
PREFILLED_SYRINGE | INTRAVENOUS | Status: AC
Start: 2023-02-16 — End: ?
  Filled 2023-02-16: qty 10

## 2023-02-16 MED ORDER — IOHEXOL 350 MG/ML SOLN
100.0000 mL | Freq: Once | INTRAVENOUS | Status: AC | PRN
  Administered 2023-02-16: 100 mL via INTRAVENOUS

## 2023-02-16 MED ORDER — MORPHINE SULFATE (PF) 2 MG/ML IV SOLN
2.0000 mg | INTRAVENOUS | Status: DC | PRN
  Administered 2023-02-17 – 2023-02-18 (×2): 2 mg via INTRAVENOUS
  Filled 2023-02-16 (×2): qty 1

## 2023-02-16 MED ORDER — ACETAMINOPHEN 10 MG/ML IV SOLN: 1000.0000 mg | Freq: Once | INTRAVENOUS | Status: DC | PRN

## 2023-02-16 MED ORDER — CEFAZOLIN SODIUM-DEXTROSE 2-3 GM-%(50ML) IV SOLR
INTRAVENOUS | Status: DC | PRN
  Administered 2023-02-16: 2 g via INTRAVENOUS

## 2023-02-16 MED ORDER — LIDOCAINE HCL (CARDIAC) PF 100 MG/5ML IV SOSY
PREFILLED_SYRINGE | INTRAVENOUS | Status: DC | PRN
  Administered 2023-02-16: 50 mg via INTRAVENOUS

## 2023-02-16 MED ORDER — HEMOSTATIC AGENTS (NO CHARGE) OPTIME
TOPICAL | Status: DC | PRN
  Administered 2023-02-16: 1 via TOPICAL

## 2023-02-16 MED ORDER — MIDAZOLAM HCL 2 MG/2ML IJ SOLN
INTRAMUSCULAR | Status: AC
  Filled 2023-02-16: qty 2

## 2023-02-16 MED ORDER — SUCCINYLCHOLINE CHLORIDE 200 MG/10ML IV SOSY
PREFILLED_SYRINGE | INTRAVENOUS | Status: AC
  Filled 2023-02-16: qty 10

## 2023-02-16 MED ORDER — MORPHINE SULFATE (PF) 2 MG/ML IV SOLN
2.0000 mg | Freq: Once | INTRAVENOUS | Status: AC
  Administered 2023-02-16: 2 mg via INTRAVENOUS
  Filled 2023-02-16: qty 1

## 2023-02-16 MED ORDER — LIDOCAINE HCL (PF) 2 % IJ SOLN
INTRAMUSCULAR | Status: AC
  Filled 2023-02-16: qty 5

## 2023-02-16 MED ORDER — FENTANYL CITRATE (PF) 100 MCG/2ML IJ SOLN
INTRAMUSCULAR | Status: DC | PRN
  Administered 2023-02-16 (×2): 50 ug via INTRAVENOUS

## 2023-02-16 MED ORDER — PROPOFOL 10 MG/ML IV BOLUS
INTRAVENOUS | Status: AC
  Filled 2023-02-16: qty 20

## 2023-02-16 MED ORDER — FENTANYL CITRATE (PF) 100 MCG/2ML IJ SOLN: 25.0000 ug | INTRAMUSCULAR | Status: DC | PRN

## 2023-02-16 MED ORDER — CALCIUM CHLORIDE 10 % IV SOLN
INTRAVENOUS | Status: DC | PRN
  Administered 2023-02-16: 1 g via INTRAVENOUS

## 2023-02-16 MED ORDER — POLYETHYLENE GLYCOL 3350 17 G PO PACK: 17.0000 g | PACK | Freq: Every day | ORAL | Status: DC | PRN

## 2023-02-16 MED ORDER — INSULIN ASPART 100 UNIT/ML IJ SOLN
0.0000 [IU] | INTRAMUSCULAR | Status: DC
  Administered 2023-02-17: 3 [IU] via SUBCUTANEOUS
  Administered 2023-02-17: 2 [IU] via SUBCUTANEOUS
  Administered 2023-02-17: 3 [IU] via SUBCUTANEOUS
  Administered 2023-02-17: 5 [IU] via SUBCUTANEOUS
  Administered 2023-02-17: 2 [IU] via SUBCUTANEOUS
  Administered 2023-02-17: 3 [IU] via SUBCUTANEOUS
  Administered 2023-02-18 (×3): 2 [IU] via SUBCUTANEOUS
  Filled 2023-02-16 (×9): qty 1

## 2023-02-16 MED ORDER — PANTOPRAZOLE SODIUM 40 MG IV SOLR
40.0000 mg | Freq: Two times a day (BID) | INTRAVENOUS | Status: DC
  Administered 2023-02-16 – 2023-02-18 (×4): 40 mg via INTRAVENOUS
  Filled 2023-02-16 (×4): qty 10

## 2023-02-16 MED ORDER — SUCCINYLCHOLINE CHLORIDE 200 MG/10ML IV SOSY
PREFILLED_SYRINGE | INTRAVENOUS | Status: DC | PRN
  Administered 2023-02-16: 120 mg via INTRAVENOUS

## 2023-02-16 MED ORDER — ONDANSETRON HCL 4 MG/2ML IJ SOLN
INTRAMUSCULAR | Status: DC | PRN
  Administered 2023-02-16: 4 mg via INTRAVENOUS

## 2023-02-16 MED ORDER — OXYCODONE HCL 5 MG PO TABS: 5.0000 mg | ORAL_TABLET | Freq: Once | ORAL | Status: DC | PRN

## 2023-02-16 MED ORDER — SODIUM CHLORIDE 0.9 % IV SOLN: INTRAVENOUS | Status: AC

## 2023-02-16 MED ORDER — PHENYLEPHRINE 80 MCG/ML (10ML) SYRINGE FOR IV PUSH (FOR BLOOD PRESSURE SUPPORT)
PREFILLED_SYRINGE | INTRAVENOUS | Status: DC | PRN
  Administered 2023-02-16: 80 ug via INTRAVENOUS
  Administered 2023-02-16: 160 ug via INTRAVENOUS
  Administered 2023-02-16: 200 ug via INTRAVENOUS
  Administered 2023-02-16: 160 ug via INTRAVENOUS

## 2023-02-16 MED ORDER — CEFAZOLIN SODIUM 1 G IJ SOLR
INTRAMUSCULAR | Status: AC
  Filled 2023-02-16: qty 20

## 2023-02-16 MED ORDER — MIDAZOLAM HCL 2 MG/2ML IJ SOLN
INTRAMUSCULAR | Status: DC | PRN
  Administered 2023-02-16: 2 mg via INTRAVENOUS

## 2023-02-16 MED ORDER — 0.9 % SODIUM CHLORIDE (POUR BTL) OPTIME
TOPICAL | Status: DC | PRN
  Administered 2023-02-16: 50 mL

## 2023-02-16 MED ORDER — ONDANSETRON HCL 4 MG/2ML IJ SOLN: 4.0000 mg | Freq: Once | INTRAMUSCULAR | Status: DC | PRN

## 2023-02-16 MED ORDER — ARFORMOTEROL TARTRATE 15 MCG/2ML IN NEBU
15.0000 ug | INHALATION_SOLUTION | Freq: Two times a day (BID) | RESPIRATORY_TRACT | Status: DC
Start: 2023-02-17 — End: 2023-02-18
  Administered 2023-02-17 – 2023-02-18 (×3): 15 ug via RESPIRATORY_TRACT
  Filled 2023-02-16 (×3): qty 2

## 2023-02-16 MED ORDER — ORAL CARE MOUTH RINSE: 15.0000 mL | OROMUCOSAL | Status: DC | PRN

## 2023-02-16 MED ORDER — SODIUM CHLORIDE 0.9 % IV SOLN: 10.0000 mL/h | Freq: Once | INTRAVENOUS | Status: AC

## 2023-02-16 MED ORDER — DEXAMETHASONE SODIUM PHOSPHATE 10 MG/ML IJ SOLN
INTRAMUSCULAR | Status: DC | PRN
  Administered 2023-02-16: 10 mg via INTRAVENOUS

## 2023-02-16 MED ORDER — IPRATROPIUM-ALBUTEROL 0.5-2.5 (3) MG/3ML IN SOLN: 3.0000 mL | RESPIRATORY_TRACT | Status: DC | PRN

## 2023-02-16 MED ORDER — CHLORHEXIDINE GLUCONATE CLOTH 2 % EX PADS
6.0000 | MEDICATED_PAD | Freq: Every day | CUTANEOUS | Status: DC
  Administered 2023-02-17: 6 via TOPICAL

## 2023-02-16 MED ORDER — DOCUSATE SODIUM 100 MG PO CAPS: 100.0000 mg | ORAL_CAPSULE | Freq: Two times a day (BID) | ORAL | Status: DC | PRN

## 2023-02-16 MED ORDER — OXYCODONE HCL 5 MG/5ML PO SOLN: 5.0000 mg | Freq: Once | ORAL | Status: DC | PRN

## 2023-02-16 MED ORDER — DEXAMETHASONE SODIUM PHOSPHATE 10 MG/ML IJ SOLN
INTRAMUSCULAR | Status: AC
  Filled 2023-02-16: qty 1

## 2023-02-16 MED ORDER — MICROFIBRILLAR COLL HEMOSTAT EX PADS: MEDICATED_PAD | CUTANEOUS | Status: DC | PRN

## 2023-02-16 MED ORDER — ONDANSETRON HCL 4 MG/2ML IJ SOLN
INTRAMUSCULAR | Status: AC
  Filled 2023-02-16: qty 2

## 2023-02-16 MED ORDER — FENTANYL CITRATE (PF) 100 MCG/2ML IJ SOLN
INTRAMUSCULAR | Status: AC
  Filled 2023-02-16: qty 2

## 2023-02-16 MED ORDER — UMECLIDINIUM BROMIDE 62.5 MCG/ACT IN AEPB
1.0000 | INHALATION_SPRAY | Freq: Every day | RESPIRATORY_TRACT | Status: DC
Start: 2023-02-17 — End: 2023-02-18
  Administered 2023-02-17 – 2023-02-18 (×2): 1 via RESPIRATORY_TRACT
  Filled 2023-02-16: qty 7

## 2023-02-16 MED ORDER — SURGIFLO WITH THROMBIN (HEMOSTATIC MATRIX KIT) OPTIME
TOPICAL | Status: DC | PRN
  Administered 2023-02-16: 1 via TOPICAL

## 2023-02-16 SURGICAL SUPPLY — 27 items
ADHESIVE MASTISOL STRL (MISCELLANEOUS) IMPLANT
DRAPE LEGGINS SURG 28X43 STRL (DRAPES) ×1 IMPLANT
DRAPE SHEET LG 3/4 BI-LAMINATE (DRAPES) ×1 IMPLANT
DRAPE UNDER BUTTOCK W/FLU (DRAPES) ×1 IMPLANT
DRESSING SURGICEL FIBRLLR 1X2 (HEMOSTASIS) IMPLANT
DRSG SURGICEL FIBRILLAR 1X2 (HEMOSTASIS) ×1
ELECT REM PT RETURN 9FT ADLT (ELECTROSURGICAL) ×1
ELECTRODE REM PT RTRN 9FT ADLT (ELECTROSURGICAL) ×1 IMPLANT
GAUZE 4X4 16PLY ~~LOC~~+RFID DBL (SPONGE) ×1 IMPLANT
GLOVE BIO SURGEON STRL SZ7 (GLOVE) ×1 IMPLANT
GLOVE BIO SURGEON STRL SZ7.5 (GLOVE) IMPLANT
GLOVE BIOGEL PI IND STRL 8 (GLOVE) IMPLANT
GOWN STRL REUS W/ TWL LRG LVL3 (GOWN DISPOSABLE) ×2 IMPLANT
HEMOSTAT SURGICEL 2X14 (HEMOSTASIS) IMPLANT
MANIFOLD NEPTUNE II (INSTRUMENTS) ×1 IMPLANT
NS IRRIG 1000ML POUR BTL (IV SOLUTION) ×1 IMPLANT
PACK BASIN MINOR ARMC (MISCELLANEOUS) ×1 IMPLANT
SOL PREP PVP 2OZ (MISCELLANEOUS) ×2
SOLUTION PREP PVP 2OZ (MISCELLANEOUS) ×1 IMPLANT
SURGIFLO W/THROMBIN 8M KIT (HEMOSTASIS) IMPLANT
SURGILUBE 2OZ TUBE FLIPTOP (MISCELLANEOUS) ×1 IMPLANT
SUT VIC AB 2-0 SH 27XBRD (SUTURE) IMPLANT
SUT VICRYL 0 AB UR-6 (SUTURE) IMPLANT
TAPE CLOTH 3X10 WHT NS LF (GAUZE/BANDAGES/DRESSINGS) IMPLANT
TOWEL OR 17X26 4PK STRL BLUE (TOWEL DISPOSABLE) IMPLANT
TRAP FLUID SMOKE EVACUATOR (MISCELLANEOUS) ×1 IMPLANT
WATER STERILE IRR 500ML POUR (IV SOLUTION) ×1 IMPLANT

## 2023-02-16 NOTE — Op Note (Signed)
   PRE-OPERATIVE DIAGNOSIS: Hemorrhagic shock from lower GI bleed and hemorrhoidectomy site   POST-OPERATIVE DIAGNOSIS:  Same  PROCEDURE:   Exam under anesthesia and Rectal exploration  control of  rectal bleeding by  repairing of  rectal tear from  hemorrhoidectomy site  SURGEON:  Surgeon(s) and Role:    * Ronnetta Currington F, MD - Primary   ANESTHESIA: General   FINDINGS:  Active bleeding from apex of hemorrhoidectomy sites , Left and Right anterolateral positions.   DICTATION:  Patient was explained about the  procedure in detail, risks benefits and  possible complications and a consent was obtained. The patient taken to the operating room and placed position w all pressure points padded.  Exam revealed an bleeding from the apex of the hemorrhoidal site, there was a massive clots and exposure was not easy given active bleeding and clots and very deep location of the oozing. I placed two figure of eight 2-0 vicryl stitches at the apex of the hemorrhoidectomy site. THis controlled the bleeding. I used a taco  by rolling the surgicel over the fibrillar . Surgiflow was also added for adjuvant hemostatic control. Upon the pt emerging from anesthesia there was some old residual clots that he evacuated inspection revealed no evidence of active bleeding. Pt will be transferred to the ICU for further monitoring. If he continues to bleed may need embolization by vascular surgery. We will discuss w critical care team about our plan.  Needle and laparotomy counts were correct and there were no immediate complications  Laneta JULIANNA Luna, MD

## 2023-02-16 NOTE — Consult Note (Signed)
 NAME:  Victor Alvarez, MRN:  [id number], DOB:  09-28-1960, LOS: 0 ADMISSION DATE:  02/16/2023, CONSULTATION DATE:  02/16/23 REFERRING MD:  Dr. Jordis, CHIEF COMPLAINT:  Rectal bleeding   History of Present Illness:  63 yo M presenting to [facility] ED from home via EMS for evaluation of rectal bleeding.  History provided per chart review and patient bedside report. 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 Procedure completed without complication and patient same day discharge. Patient in his normal state of health until about 17:00 on 02/16/23 when he began having continuous rectal bleeding. He described feeling light-headed with lowe abdominal cramping. Patient denied chest pain shortness of breath, falls, or LOC. EMS reported hypotension that was IVF responsive.  ED course: Patient alert and responsive, but pale and diaphoretic with noticeable gross red blood per rectum, continuous over first 30 minutes after arrival. Vitals initially stable with SBP in the low 100's, which quickly dropped into the 80's and 70's. Patient ordered 2 units of pRBC's and taken emergently to the OR for surgical intervention. Patient intubated for surgery with repair of rectal tear at the hemorrhoidectomy site. Patient extubated post surgical intervention, vitals stable, transferred to ICU for overnight monitoring.  Medications given: 3 L IVF, 2 units of pRBC's Initial Vitals: 97.4, 18, 95, 102/68 & 96% on RA Significant labs: (Labs/ Imaging personally reviewed) I, Jenita Ruth Rust-Chester, AGACNP-BC, personally viewed and interpreted this ECG. EKG Interpretation: Date: 02/16/23, EKG Time: 17:58, Rate: 94,  Rhythm: NSR, QRS Axis: normal, Intervals: normal, ST/T Wave abnormalities: none, Narrative Interpretation: NSR Chemistry: Na+: 141, K+: 4.4, BUN/Cr.: 6/1.00, Serum CO2/ AG: 26/11 Hematology: WBC:  10.5, Hgb: 12.1 > 11.2,   CT angio GIB 02/16/23: Mild anorectal wall thickening/irregularity, possibly related to recent rectal surgery. No evidence of active GI bleeding by CT. 4 mm nonobstructing right lower pole renal calculus. No hydronephrosis.  PCCM consulted for assistance in management and monitoring due to hemorrhagic shock s/t rectal tear in the setting of recent hemorrhoidectomy s/p surgical repair with high risk for re-bleeding.  Pertinent  Medical History  COPD GERD Neuromuscular disorder HTN T2DM Current everyday Smoker (50 pack year history) HLD PTSD OSA Depression Arthritis  Significant Hospital Events: Including procedures, antibiotic start and stop dates in addition to other pertinent events   02/16/23: Admit to ICU with hemorrhagic shock s/t rectal tear in the setting of recent hemorrhoidectomy s/p surgical repair with high risk for re-bleeding.  Interim History / Subjective:  Patient alert and responsive, pain 7/10 at rectal site. Morphine  ordered, plan of care discussed, all questions and concerns answered at this time.  Objective   Blood pressure (!) 79/59, pulse (!) 120, temperature 98.5 F (36.9 C), temperature source Oral, resp. rate 16, height 5' 10 (1.778 m), weight 102.1 kg, SpO2 97%.       No intake or output data in the 24 hours ending 02/16/23 2031 Filed Weights   02/16/23 1818  Weight: 102.1 kg    Examination: General: Adult male, acutely ill, lying in bed, NAD HEENT: MM pink/moist, anicteric, atraumatic, neck supple Neuro: A&O x 4, follows commands, PERRL +3, MAE CV: s1s2 RRR, NSR on monitor, no r/m/g Pulm: Regular, non labored on 2L [place], breath sounds clear-BUL & diminished-BLL GI: soft, rounded, non tender, bs x 4 Skin: surgical site packed- UTA Extremities: warm/dry, pulses + 2 R/P, no edema noted  Resolved Hospital Problem  list     Assessment & Plan:  Hemorrhagic Shock secondary to rectal tear in the setting of recent  hemorrhoidectomy status post surgical repair- improved - serial H&H, f/u DIC panel, Daily CBC - Monitor for s/s of bleeding - Transfuse for Hgb < 7 - consider vasopressor support PRN to maintain MAP > 65 - f/u CT angio GIB, consider vascular surgery consultation PRN - maintain NPO overnight  Chronic COPD  without exacerbation PMHx: COPD, tobacco use, OSA - Supplemental oxygen as needed, maintain SpO2 > 90% - Continue Bronchodilators as needed, placed on arformoterol  & umeclidinium bromide  daily - Smoking cessation education provided  HTN - hold anti-hypertensive medications at this time, consider restarting as patient stabilizes: prazosin , Lopressor  - hold ASA due to above  Type 2 Diabetes Mellitus Hemoglobin A1C: 6.6 (12/2022) - Monitor CBG Q 4 hours - SSI moderate dosing - target range while in ICU: 140-180 - follow ICU hyper/hypo-glycemia protocol  GERD - home PPI   HLD - consider restarting outpatient Rosuvastatin  if stable overnight  PTSD Anxiety & Depression Bipolar affective Disorder - consider restarting outpatient regimen if stable overnight: Abilify , xanax , Buspar , Cariprazine , Depakote , Invega  & Trazodone  - supportive care  Best Practice (right click and Reselect all SmartList Selections daily)  Diet/type: NPO DVT prophylaxis SCD Pressure ulcer(s): N/A GI prophylaxis: PPI Lines: N/A Foley:  N/A Code Status:  full code Last date of multidisciplinary goals of care discussion [per primary service]  Labs   CBC: Recent Labs  Lab 02/16/23 1759  WBC 10.5  NEUTROABS 6.4  HGB 12.1*  HCT 37.4*  MCV 95.4  PLT 293    Basic Metabolic Panel: Recent Labs  Lab 02/16/23 1759  NA 138  K 4.4  CL 101  CO2 26  GLUCOSE 195*  BUN 7*  CREATININE 1.07  CALCIUM  8.2*   GFR: Estimated Creatinine Clearance: 83.5 mL/min (by C-G formula based on SCr of 1.07 mg/dL). Recent Labs  Lab 02/16/23 1759  WBC 10.5    Liver Function Tests: Recent Labs  Lab  02/16/23 1759  AST 11*  ALT 11  ALKPHOS 55  BILITOT 0.6  PROT 5.5*  ALBUMIN 3.0*   No results for input(s): LIPASE, AMYLASE in the last 168 hours. No results for input(s): AMMONIA in the last 168 hours.  ABG No results found for: PHART, PCO2ART, PO2ART, HCO3, TCO2, ACIDBASEDEF, O2SAT   Coagulation Profile: Recent Labs  Lab 02/16/23 1759  INR 1.0    Cardiac Enzymes: No results for input(s): CKTOTAL, CKMB, CKMBINDEX, TROPONINI in the last 168 hours.  HbA1C: Hgb A1c MFr Bld  Date/Time Value Ref Range Status  12/10/2022 11:14 AM 6.6 (H) 4.8 - 5.6 % Final    Comment:             Prediabetes: 5.7 - 6.4          Diabetes: >6.4          Glycemic control for adults with diabetes: <7.0     CBG: Recent Labs  Lab 02/10/23 1229 02/10/23 1457  GLUCAP 106* 214*    Review of Systems: Positives in BOLD  Gen: Denies fever, chills, weight change, fatigue, night sweats HEENT: Denies blurred vision, double vision, hearing loss, tinnitus, sinus congestion, rhinorrhea, sore throat, neck stiffness, dysphagia PULM: Denies shortness of breath, cough, sputum production, hemoptysis, wheezing CV: Denies chest pain, edema, orthopnea, paroxysmal nocturnal dyspnea, palpitations GI: Denies abdominal pain, nausea, vomiting, diarrhea, hematochezia, melena, constipation, change in bowel habits GU: Denies dysuria, hematuria, polyuria, oliguria,  urethral discharge Endocrine: Denies hot or cold intolerance, polyuria, polyphagia or appetite change Derm: Denies rash, dry skin, scaling or peeling skin change Heme: Denies easy bruising, bleeding, bleeding gums Neuro: Denies headache, numbness, weakness, slurred speech, loss of memory or consciousness  Past Medical History:  He,  has a past medical history of Acute appendicitis (08/24/2019), Allergy, Arthritis, Bipolar affective disorder (HCC), Colon polyp, COPD (chronic obstructive pulmonary disease) (HCC), Depression, GERD  (gastroesophageal reflux disease), Hemorrhoids, Hypercholesterolemia, Mixed hyperlipidemia, Neuromuscular disorder (HCC), PTSD (post-traumatic stress disorder), Right wrist fracture (03/25/2015), Sleep apnea, and Type 2 diabetes mellitus without complication, without long-term current use of insulin  (HCC).   Surgical History:   Past Surgical History:  Procedure Laterality Date   CATARACT EXTRACTION W/ INTRAOCULAR LENS IMPLANT Right    COLONOSCOPY WITH PROPOFOL  N/A 01/18/2023   Procedure: COLONOSCOPY WITH PROPOFOL ;  Surgeon: Therisa Bi, MD;  Location: [facility] ENDOSCOPY;  Service: Gastroenterology;  Laterality: N/A;   HEMORRHOID SURGERY N/A 02/10/2023   Procedure: HEMORRHOIDECTOMY;  Surgeon: Lane Shope, MD;  Location: [facility] ORS;  Service: General;  Laterality: N/A;  surgifoam placed   HEMOSTASIS CLIP PLACEMENT  01/18/2023   Procedure: HEMOSTASIS CLIP PLACEMENT;  Surgeon: Therisa Bi, MD;  Location: [facility] ENDOSCOPY;  Service: Gastroenterology;;   MINOR FULGERATION OF ANAL CONDYLOMA N/A 02/10/2023   Procedure: MINOR FULGERATION OF ANAL CONDYLOMA;  Surgeon: Lane Shope, MD;  Location: [facility] ORS;  Service: General;  Laterality: N/A;   POLYPECTOMY  01/18/2023   Procedure: POLYPECTOMY;  Surgeon: Therisa Bi, MD;  Location: [facility] ENDOSCOPY;  Service: Gastroenterology;;   RECTAL EXAM UNDER ANESTHESIA N/A 02/10/2023   Procedure: RECTAL EXAM UNDER ANESTHESIA;  Surgeon: Lane Shope, MD;  Location: [facility] ORS;  Service: General;  Laterality: N/A;   WRIST FRACTURE SURGERY Right 04/11/2015   XI ROBOTIC LAPAROSCOPIC ASSISTED APPENDECTOMY N/A 08/24/2019   Procedure: XI ROBOTIC LAPAROSCOPIC ASSISTED APPENDECTOMY;  Surgeon: Lane Shope, MD;  Location: [facility] ORS;  Service: General;  Laterality: N/A;     Social History:   reports that he has been smoking cigarettes. He started smoking about 51 years ago. He has a 51 pack-year smoking history. He has quit using smokeless tobacco.  His smokeless tobacco use  included chew. He reports that he does not currently use alcohol. He reports current drug use. Drug: Marijuana.   Family History:  His family history is not on file.   Allergies Allergies  Allergen Reactions   Codeine Nausea And Vomiting, Other (See Comments) and Nausea Only   Metformin  Other (See Comments)    unsure     Home Medications  Prior to Admission medications   Medication Sig Start Date End Date Taking? Authorizing Provider  albuterol  (VENTOLIN  HFA) 108 (90 Base) MCG/ACT inhaler Inhale 2 puffs into the lungs every 6 (six) hours as needed for wheezing or shortness of breath. 07/31/19   [provider]  ALPRAZolam  (XANAX ) 1 MG tablet Take 1 mg by mouth 3 (three) times daily as needed for anxiety. 06/29/19   [provider]  ARIPiprazole  (ABILIFY ) 10 MG tablet Take 1 tablet (10 mg total) by mouth daily. 02/09/23 03/11/23  Ostwalt, Janna, PA-C  aspirin  81 MG EC tablet Take 81 mg by mouth daily. 02/24/17   [provider]  B Complex-C (B-COMPLEX WITH VITAMIN C) tablet Take 1 tablet by mouth daily.    [provider]  busPIRone  (BUSPAR ) 30 MG tablet Take 1 tablet (30 mg total) by mouth 2 (two) times daily. 01/29/23   Gasper Nancyann BRAVO, MD  cariprazine  (VRAYLAR ) 3 MG capsule Take 1 capsule (3 mg total) by mouth daily. 12/30/22   Emilio Kelly DASEN, FNP  Cholecalciferol (VITAMIN D3) 50 MCG (2000 UT) capsule Take 2,000 Units by mouth daily. 10/30/22   [provider]  divalproex  (DEPAKOTE ) 250 MG DR tablet Take 250 mg by mouth 2 (two) times daily.    [provider]  fluticasone  (FLONASE ) 50 MCG/ACT nasal spray Place 2 sprays into both nostrils daily. sometimes uses in the evening as needed 04/08/18   [provider]  glucose blood ([employer] BLOOD GLUCOSE TEST) test strip Use to test glucose once daily 04/03/20   [provider]  HYDROcodone -acetaminophen  (NORCO/VICODIN) 5-325 MG tablet Take 1 tablet by mouth every 6 (six) hours as  needed for moderate pain (pain score 4-6). 02/10/23   Lane Shope, MD  ibuprofen  (ADVIL ) 800 MG tablet Take 1 tablet (800 mg total) by mouth every 8 (eight) hours as needed. 02/10/23   Lane Shope, MD  metoprolol  succinate (TOPROL -XL) 25 MG 24 hr tablet Take 25 mg by mouth daily. 11/20/22   [provider]  omeprazole  (PRILOSEC) 40 MG capsule Take 40 mg by mouth daily. 11/20/22   [provider]  paliperidone  (INVEGA ) 6 MG 24 hr tablet Take 6 mg by mouth every morning. 07/26/19   [provider]  prazosin  (MINIPRESS ) 5 MG capsule Take 5 mg by mouth at bedtime.    [provider]  promethazine (PHENERGAN) 25 MG tablet Take 25 mg by mouth every 8 (eight) hours as needed for vomiting or nausea.    [provider]  rosuvastatin  (CRESTOR ) 20 MG tablet Take 1 tablet (20 mg total) by mouth daily. 01/08/23   Ostwalt, Janna, PA-C  sucralfate (CARAFATE) 1 g tablet Take 1 g by mouth 4 (four) times daily -  with meals and at bedtime.    [provider]  Tiotropium Bromide-Olodaterol (STIOLTO RESPIMAT ) 2.5-2.5 MCG/ACT AERS Inhale 2 puffs into the lungs daily. 01/19/23   Isadora Hose, MD  traZODone  (DESYREL ) 100 MG tablet Take 300 mg by mouth at bedtime.    [provider]  VIIBRYD  40 MG TABS Take 40 mg by mouth daily. 05/23/19   [provider]  zolpidem  (AMBIEN ) 10 MG tablet Take 10 mg by mouth at bedtime. 08/07/19   [provider]     Critical care time: 67 minutes      Jenita Jama Meek, AGACNP-BC Acute Care Nurse Practitioner [facility]   [phone] / [phone] Please see Amion for details.

## 2023-02-16 NOTE — ED Triage Notes (Signed)
 Rectal surgery 6 days ago. This evening began having heavy bright red rectal bleeding with clots. Became diaphoretic and short of breath when bleeding would not stop. BP 89 systolic on arrival and patient noted to be pale and diaphoretic. Active rectal bleeding noted. Gave NS in route and patient states he is feeling some better per EMS

## 2023-02-16 NOTE — Transfer of Care (Signed)
 Immediate Anesthesia Transfer of Care Note  Patient: Victor Alvarez  Procedure(s) Performed: RECTAL EXAM UNDER ANESTHESIA (Rectum)  Patient Location: PACU  Anesthesia Type:General  Level of Consciousness: awake, alert , and oriented  Airway & Oxygen Therapy: Patient Spontanous Breathing and Patient connected to face mask oxygen  Post-op Assessment: Report given to RN and Post -op Vital signs reviewed and stable  Post vital signs: Reviewed  Last Vitals:  Vitals Value Taken Time  BP 86/46 02/16/23 2128  Temp    Pulse 105   Resp 17 02/16/23 2130  SpO2 98   Vitals shown include unfiled device data.  Last Pain:  Vitals:   02/16/23 1949  TempSrc: Oral  PainSc:          Complications: No notable events documented.

## 2023-02-16 NOTE — H&P (Signed)
 Patient ID: Victor Alvarez, male   DOB: Oct 05, 1960, 63 y.o.   MRN: [id number]  HPI Victor Alvarez is a 63 y.o. male status post hemorrhoidectomy 6 days ago by Dr. Lane.  I personally reviewed the operative report and discussed with him in detail about operative procedure.  This was done in prone jackknife position and there was significant anorectal disease including significant hemorrhoidal cushions associated with some lesions consistent with condyloma.  Patient reports that he started having rectal bleeding today it has been pretty massive and has had at least 3-4 bright red bloody bowel movements.  He called EMS and was hypotensive en route but responded to 500 cc of fluid. He is not on any anticoagulants or antiplatelets. His hemoglobin is 12 with normal INR and normal CMP. HE did have 2 units of emergency release blood ordered, has been hypotensive and tachycardic.  HPI  Past Medical History:  Diagnosis Date   Acute appendicitis 08/24/2019   Allergy    Arthritis    Bipolar affective disorder (HCC)    Colon polyp    COPD (chronic obstructive pulmonary disease) (HCC)    Depression    GERD (gastroesophageal reflux disease)    Hemorrhoids    Hypercholesterolemia    Mixed hyperlipidemia    Neuromuscular disorder (HCC)    PTSD (post-traumatic stress disorder)    Right wrist fracture 03/25/2015   Sleep apnea    Type 2 diabetes mellitus without complication, without long-term current use of insulin  (HCC)     Past Surgical History:  Procedure Laterality Date   CATARACT EXTRACTION W/ INTRAOCULAR LENS IMPLANT Right    COLONOSCOPY WITH PROPOFOL  N/A 01/18/2023   Procedure: COLONOSCOPY WITH PROPOFOL ;  Surgeon: Therisa Bi, MD;  Location: [facility] ENDOSCOPY;  Service: Gastroenterology;  Laterality: N/A;   HEMORRHOID SURGERY N/A 02/10/2023   Procedure: HEMORRHOIDECTOMY;  Surgeon: Lane Shope, MD;  Location: [facility] ORS;  Service: General;  Laterality: N/A;  surgifoam placed   HEMOSTASIS CLIP  PLACEMENT  01/18/2023   Procedure: HEMOSTASIS CLIP PLACEMENT;  Surgeon: Therisa Bi, MD;  Location: [facility] ENDOSCOPY;  Service: Gastroenterology;;   MINOR FULGERATION OF ANAL CONDYLOMA N/A 02/10/2023   Procedure: MINOR FULGERATION OF ANAL CONDYLOMA;  Surgeon: Lane Shope, MD;  Location: [facility] ORS;  Service: General;  Laterality: N/A;   POLYPECTOMY  01/18/2023   Procedure: POLYPECTOMY;  Surgeon: Therisa Bi, MD;  Location: [facility] ENDOSCOPY;  Service: Gastroenterology;;   RECTAL EXAM UNDER ANESTHESIA N/A 02/10/2023   Procedure: RECTAL EXAM UNDER ANESTHESIA;  Surgeon: Lane Shope, MD;  Location: [facility] ORS;  Service: General;  Laterality: N/A;   WRIST FRACTURE SURGERY Right 04/11/2015   XI ROBOTIC LAPAROSCOPIC ASSISTED APPENDECTOMY N/A 08/24/2019   Procedure: XI ROBOTIC LAPAROSCOPIC ASSISTED APPENDECTOMY;  Surgeon: Lane Shope, MD;  Location: [facility] ORS;  Service: General;  Laterality: N/A;    History reviewed. No pertinent family history.  Social History Social History   Tobacco Use   Smoking status: Every Day    Current packs/day: 1.00    Average packs/day: 1 pack/day for 51.0 years (51.0 ttl pk-yrs)    Types: Cigarettes    Start date: 1974   Smokeless tobacco: Former    Types: Engineer, Drilling   Vaping status: Former  Substance Use Topics   Alcohol use: Not Currently   Drug use: Yes    Types: Marijuana    Comment: on occassion    Allergies  Allergen Reactions   Codeine Nausea And Vomiting, Other (See Comments) and Nausea Only   Metformin   Other (See Comments)    unsure    Current Facility-Administered Medications  Medication Dose Route Frequency Provider Last Rate Last Admin   0.9 %  sodium chloride  infusion  10 mL/hr Intravenous Once Malvina Alm DASEN, MD       Current Outpatient Medications  Medication Sig Dispense Refill   albuterol  (VENTOLIN  HFA) 108 (90 Base) MCG/ACT inhaler Inhale 2 puffs into the lungs every 6 (six) hours as needed for wheezing or shortness of  breath.     ALPRAZolam  (XANAX ) 1 MG tablet Take 1 mg by mouth 3 (three) times daily as needed for anxiety.     ARIPiprazole  (ABILIFY ) 10 MG tablet Take 1 tablet (10 mg total) by mouth daily. 30 tablet 0   aspirin  81 MG EC tablet Take 81 mg by mouth daily.     B Complex-C (B-COMPLEX WITH VITAMIN C) tablet Take 1 tablet by mouth daily.     busPIRone  (BUSPAR ) 30 MG tablet Take 1 tablet (30 mg total) by mouth 2 (two) times daily. 60 tablet 3   cariprazine  (VRAYLAR ) 3 MG capsule Take 1 capsule (3 mg total) by mouth daily. 30 capsule 0   Cholecalciferol (VITAMIN D3) 50 MCG (2000 UT) capsule Take 2,000 Units by mouth daily.     divalproex  (DEPAKOTE ) 250 MG DR tablet Take 250 mg by mouth 2 (two) times daily.     fluticasone  (FLONASE ) 50 MCG/ACT nasal spray Place 2 sprays into both nostrils daily. sometimes uses in the evening as needed     glucose blood ([employer] BLOOD GLUCOSE TEST) test strip Use to test glucose once daily     HYDROcodone -acetaminophen  (NORCO/VICODIN) 5-325 MG tablet Take 1 tablet by mouth every 6 (six) hours as needed for moderate pain (pain score 4-6). 15 tablet 0   ibuprofen  (ADVIL ) 800 MG tablet Take 1 tablet (800 mg total) by mouth every 8 (eight) hours as needed. 30 tablet 0   metoprolol  succinate (TOPROL -XL) 25 MG 24 hr tablet Take 25 mg by mouth daily.     omeprazole  (PRILOSEC) 40 MG capsule Take 40 mg by mouth daily.     paliperidone  (INVEGA ) 6 MG 24 hr tablet Take 6 mg by mouth every morning.     prazosin  (MINIPRESS ) 5 MG capsule Take 5 mg by mouth at bedtime.     promethazine (PHENERGAN) 25 MG tablet Take 25 mg by mouth every 8 (eight) hours as needed for vomiting or nausea.     rosuvastatin  (CRESTOR ) 20 MG tablet Take 1 tablet (20 mg total) by mouth daily. 90 tablet 1   sucralfate (CARAFATE) 1 g tablet Take 1 g by mouth 4 (four) times daily -  with meals and at bedtime.     Tiotropium Bromide-Olodaterol (STIOLTO RESPIMAT ) 2.5-2.5 MCG/ACT AERS Inhale 2 puffs into the lungs  daily. 60 each 12   traZODone  (DESYREL ) 100 MG tablet Take 300 mg by mouth at bedtime.     VIIBRYD  40 MG TABS Take 40 mg by mouth daily.     zolpidem  (AMBIEN ) 10 MG tablet Take 10 mg by mouth at bedtime.       Review of Systems Full ROS  was asked and was negative except for the information on the HPI  Physical Exam Blood pressure (!) 79/59, pulse (!) 120, temperature 98.5 F (36.9 C), temperature source Oral, resp. rate 16, height 5' 10 (1.778 m), weight 102.1 kg, SpO2 97%. CONSTITUTIONAL: Debilitated and chronically ill. Pale and diaphoretic  EYES: Pupils are equal, round,  Sclera are  non-icteric. EARS, NOSE, MOUTH AND THROAT: The oropharynx is clear. The oral mucosa is pink and moist. Hearing is intact to voice. LYMPH NODES:  Lymph nodes in the neck are normal. RESPIRATORY:  Lungs are clear. There is normal respiratory effort, with equal breath sounds bilaterally, and without pathologic use of accessory muscles. CARDIOVASCULAR: Heart is regular , tachycardic without murmurs, gallops, or rubs. GI: The abdomen is  soft, diffuse tenderness without peritonitis. There are no palpable masses. There is no hepatosplenomegaly. There are normal bowel sounds in all quadrants. Rectal: full of blood clots unable to determine specific site of bleeding.    MUSCULOSKELETAL: Normal muscle strength and tone. No cyanosis or edema.   SKIN: Turgor is good and there are no pathologic skin lesions or ulcers. NEUROLOGIC: Motor and sensation is grossly normal. Cranial nerves are grossly intact. PSYCH:  Oriented to person, place and time. Affect is normal.  Data Reviewed  I have personally reviewed the patient's imaging, laboratory findings and medical records.    Assessment/Plan 63 year old male with lower GI bleed following hemorrhoidectomy 6 days ago.  Patient has obvious signs of active bleeding and hemorrhagic shock based on clinical findings and physical examination.  He needs to go back emergently  to the operating room for an exam under anesthesia.  I have discussed the case in detail with his original surgeon Dr. Lane and this is best to do it prone since it facilitates access to surgical site. Procedure d/w pt in detail. Risks, benefits and possible complications including but not limited to bleeding, requiring potential embolization by ER, perineal sepsis, injury to anorectal sphincter. I think at this time his best bet is to control bleeding in The OR under GETA.    Laneta Luna, MD FACS General Surgeon 02/16/2023, 8:11 PM

## 2023-02-16 NOTE — Progress Notes (Signed)
 eLink Physician-Brief Progress Note Patient Name: Maccoy Haubner DOB: March 26, 1960 MRN: [id number]   Date of Service  02/16/2023  HPI/Events of Note  63 year old male with a history of gastroesophageal reflux disease, chronic obstructive pulmonary disease, and unspecified neuromuscular disorder who status post hemorrhoidectomy 6 days ago who returns to the emergency department with rectal bleeding complicated by hemorrhagic shock.  Rectal tear was repaired by surgical staff and referred to critical care for further management.  On examination, the patient is tachycardic but otherwise vital signs are normal.  Saturating 94% on 2 L of oxygen.  Essentially normal metabolic panel.  CBC with hemoglobin of 12.1 but dropped to 11.2.  6 units of PRBC prepared, 2 units administered.  Comfortable appearing on exam.  eICU Interventions  Maintain MAP greater than 65, vasopressors as needed  Frequent hemoglobin checks, maintain hemoglobin greater than 7.  DVT prophylaxis with SCDs GI prophylaxis not indicated, home PPI     Intervention Category Evaluation Type: New Patient Evaluation  Megha Agnes 02/16/2023, 10:14 PM

## 2023-02-16 NOTE — Anesthesia Preprocedure Evaluation (Signed)
 Anesthesia Evaluation  Patient identified by MRN, date of birth, ID band Patient awake  General Assessment Comment:  Patient appears pale, lying in bloody sheets. Copious BRBPR today. NPO appropriate.  Reviewed: Allergy & Precautions, NPO status , Patient's Chart, lab work & pertinent test results  History of Anesthesia Complications Negative for: history of anesthetic complications  Airway Mallampati: II  TM Distance: >3 FB Neck ROM: Full    Dental  (+) Teeth Intact   Pulmonary sleep apnea , COPD, Current SmokerPatient did not abstain from smoking.   Pulmonary exam normal breath sounds clear to auscultation       Cardiovascular Exercise Tolerance: Good METS(-) hypertension(-) CAD and (-) Past MI negative cardio ROS (-) dysrhythmias  Rhythm:Regular Rate:Normal - Systolic murmurs    Neuro/Psych  PSYCHIATRIC DISORDERS Anxiety Depression Bipolar Disorder   negative neurological ROS     GI/Hepatic ,neg GERD  ,,(+)     (-) substance abuse    Endo/Other  diabetes, Well Controlled    Renal/GU negative Renal ROS     Musculoskeletal   Abdominal  (+) + obese  Peds  Hematology   Anesthesia Other Findings Past Medical History: 08/24/2019: Acute appendicitis No date: Allergy No date: Arthritis No date: Bipolar affective disorder (HCC) No date: Colon polyp No date: COPD (chronic obstructive pulmonary disease) (HCC) No date: Depression No date: GERD (gastroesophageal reflux disease) No date: Hemorrhoids No date: Hypercholesterolemia No date: Mixed hyperlipidemia No date: Neuromuscular disorder (HCC) No date: PTSD (post-traumatic stress disorder) 03/25/2015: Right wrist fracture No date: Sleep apnea No date: Type 2 diabetes mellitus without complication, without long- term current use of insulin  (HCC)  Reproductive/Obstetrics                             Anesthesia Physical Anesthesia  Plan  ASA: 3 and emergent  Anesthesia Plan: General   Post-op Pain Management: Ofirmev  IV (intra-op)*   Induction: Intravenous  PONV Risk Score and Plan: 1 and Ondansetron , Dexamethasone , Midazolam  and Treatment may vary due to age or medical condition  Airway Management Planned: Oral ETT and Video Laryngoscope Planned  Additional Equipment: None  Intra-op Plan:   Post-operative Plan: Extubation in OR  Informed Consent: I have reviewed the patients History and Physical, chart, labs and discussed the procedure including the risks, benefits and alternatives for the proposed anesthesia with the patient or authorized representative who has indicated his/her understanding and acceptance.     Dental advisory given  Plan Discussed with: CRNA and Surgeon  Anesthesia Plan Comments: (Discussed risks of anesthesia with patient, including PONV, sore throat, lip/dental/eye damage. Rare risks discussed as well, such as cardiorespiratory and neurological sequelae, and allergic reactions. Discussed blood transfusions .Discussed the role of CRNA in patient's perioperative care. Patient understands. Patient counseled on benefits of smoking cessation, and increased perioperative risks associated with continued smoking. )       Anesthesia Quick Evaluation

## 2023-02-16 NOTE — ED Notes (Signed)
 Patient requested to use bedpan.Stated, "my stomach feels like it will explode and I have to use the bathroom".  Bedpan filled with approximately bright red blood with small clots.

## 2023-02-16 NOTE — ED Provider Notes (Signed)
 [facility] Provider Note    Event Date/Time   First MD Initiated Contact with Patient 02/16/23 1750     (approximate)   History   Rectal Bleeding   HPI Victor Alvarez is a 63 y.o. male with recent hemorrhoidal surgery presenting today for rectal bleeding.  Patient said onset less than an hour before arrival to the ED.  He has had continuous bleeding from the rectum since.  Also has been lightheaded with lower abdominal cramping.  Not on any blood thinners.  EMS report he was hypotensive with them but it did respond to 500 cc of fluid.  Otherwise denies chest pain or shortness of breath.  Reviewed recent hemorrhoidal surgery notes.     Physical Exam   Triage Vital Signs: ED Triage Vitals [02/16/23 1757]  Encounter Vitals Group     BP 102/68     Systolic BP Percentile      Diastolic BP Percentile      Pulse Rate 95     Resp 18     Temp (!) 97.4 F (36.3 C)     Temp Source Oral     SpO2 96 %     Weight      Height      Head Circumference      Peak Flow      Pain Score      Pain Loc      Pain Education      Exclude from Growth Chart     Most recent vital signs: Vitals:   02/16/23 1818 02/16/23 1949  BP:  (!) 79/59  Pulse:  (!) 120  Resp:  16  Temp:  98.5 F (36.9 C)  SpO2: 97%    I have reviewed the vital signs. General:  Awake, alert, no acute distress. Head:  Normocephalic, Atraumatic. EENT:  PERRL, EOMI, Oral mucosa pink and moist, Neck is supple. Cardiovascular: Regular rate, 2+ distal pulses. Respiratory:  Normal respiratory effort, symmetrical expansion, no distress.   Abdomen: Mild distention to lower abdomen with slight tenderness to palpation. Rectal: Significant gross blood per rectum which is continuous Extremities:  Moving all four extremities through full ROM without pain.   Neuro:  Alert and oriented.  Interacting appropriately.   Skin:  Warm, dry, no rash.   Psych: Appropriate affect.    ED Results / Procedures /  Treatments   Labs (all labs ordered are listed, but only abnormal results are displayed) Labs Reviewed  CBC WITH DIFFERENTIAL/PLATELET - Abnormal; Notable for the following components:      Result Value   RBC 3.92 (*)    Hemoglobin 12.1 (*)    HCT 37.4 (*)    Abs Immature Granulocytes 0.17 (*)    All other components within normal limits  COMPREHENSIVE METABOLIC PANEL - Abnormal; Notable for the following components:   Glucose, Bld 195 (*)    BUN 7 (*)    Calcium  8.2 (*)    Total Protein 5.5 (*)    Albumin 3.0 (*)    AST 11 (*)    All other components within normal limits  PROTIME-INR  TYPE AND SCREEN  PREPARE RBC (CROSSMATCH)  ABO/RH     EKG My EKG interpretation: Rate of 94, normal sinus rhythm, normal axis, normal intervals, no acute ST elevations or depressions   RADIOLOGY    PROCEDURES:  Critical Care performed: Yes, see critical care procedure note(s)  .Critical Care  Performed by: Malvina Alm DASEN, MD Authorized by: Malvina,  Alm DASEN, MD   Critical care provider statement:    Critical care time (minutes):  50   Critical care was necessary to treat or prevent imminent or life-threatening deterioration of the following conditions:  Shock   Critical care was time spent personally by me on the following activities:  Development of treatment plan with patient or surrogate, discussions with consultants, evaluation of patient's response to treatment, examination of patient, ordering and review of laboratory studies, ordering and review of radiographic studies, ordering and performing treatments and interventions, pulse oximetry, re-evaluation of patient's condition and review of old charts   I assumed direction of critical care for this patient from another provider in my specialty: no     Care discussed with: admitting provider      MEDICATIONS ORDERED IN ED: Medications  0.9 %  sodium chloride  infusion (has no administration in time range)  sodium chloride  0.9 %  bolus 1,000 mL (1,000 mLs Intravenous New Bag/Given 02/16/23 1812)     IMPRESSION / MDM / ASSESSMENT AND PLAN / ED COURSE  I reviewed the triage vital signs and the nursing notes.                              Differential diagnosis includes, but is not limited to, acute hemorrhoidal bleed, arterial bleed  Patient's presentation is most consistent with acute presentation with potential threat to life or bodily function.  Patient is a 63 year old male presenting today for acute rectal bleeding in the setting of recent hemorrhoid surgery.  Noticeable gross blood per rectum which is continuous in the first 30 minutes while he is here.  Patient initially mildly hypotensive with systolics in the low 100s.  This did drop shortly after arrival.  2 units of emergent PRBC was ordered.  General surgery also discussed and will come evaluate patient at the bedside and plan for OR likely emergently.  2 unit PRBC to bedside.  Patient continued to be in a state of hemorrhagic shock.  Patient taken emergently to the OR.  The patient is on the cardiac monitor to evaluate for evidence of arrhythmia and/or significant heart rate changes. Clinical Course as of 02/16/23 1958  Tue Feb 16, 2023  1845 Spoke with general surgery - recommends lab work up, CTA abd/pel. Still stable at this time. Will get further work up before any other direct intervention [DW]  1913 Patient with ongoing rectal bleeding and now becoming unstable with tachycardia and hypotension.  2 units emergent release blood was ordered.  Rediscussed with surgery who is coming to see him now and likely plan to go to the OR. [DW]  1945 Patient getting 2 units PRBC now.  Going straight to the OR once that started [DW]  1958 Patient to OR [DW]    Clinical Course User Index [DW] Malvina Alm DASEN, MD     FINAL CLINICAL IMPRESSION(S) / ED DIAGNOSES   Final diagnoses:  Rectal bleeding  Hemorrhagic shock (HCC)     Rx / DC Orders   ED Discharge  Orders     None        Note:  This document was prepared using Dragon voice recognition software and may include unintentional dictation errors.   Malvina Alm DASEN, MD 02/16/23 [phone]

## 2023-02-16 NOTE — Anesthesia Procedure Notes (Signed)
 Procedure Name: Intubation Date/Time: 02/16/2023 8:45 PM  Performed by: Leontine Katz, CRNAPre-anesthesia Checklist: Patient identified, Patient being monitored, Timeout performed, Emergency Drugs available and Suction available Patient Re-evaluated:Patient Re-evaluated prior to induction Oxygen Delivery Method: Circle system utilized Preoxygenation: Pre-oxygenation with 100% oxygen Induction Type: IV induction and Rapid sequence Laryngoscope Size: 3 and McGrath Grade View: Grade I Tube type: Oral Tube size: 7.0 mm Number of attempts: 1 Airway Equipment and Method: Stylet Placement Confirmation: ETT inserted through vocal cords under direct vision, positive ETCO2 and breath sounds checked- equal and bilateral Secured at: 22 cm Tube secured with: Tape Dental Injury: Teeth and Oropharynx as per pre-operative assessment

## 2023-02-16 NOTE — Consult Note (Signed)
 PHARMACY CONSULT NOTE - FOLLOW UP  Pharmacy Consult for Electrolyte Monitoring and Replacement   Recent Labs: Potassium (mmol/L)  Date Value  02/16/2023 4.4   Calcium  (mg/dL)  Date Value  98/85/7974 8.2 (L)   Albumin (g/dL)  Date Value  98/85/7974 3.0 (L)  12/10/2022 4.6   Sodium (mmol/L)  Date Value  02/16/2023 138  12/10/2022 139     Assessment: 63 y.o. male status post hemorrhoidectomy 6 days ago by Dr. Lane. Patient reports that he started having rectal bleeding today it has been pretty massive and has had at least 3-4 bright red bloody bowel movements. Pt was hypotensive. Pharmacy consulted to manage electrolytes.    Goal of Therapy:  WNL  Plan:  No replacement needed at this time F/u with AM labs.   Cathaleen GORMAN Blanch ,PharmD Clinical Pharmacist 02/16/2023 8:45 PM

## 2023-02-17 ENCOUNTER — Encounter: Payer: Self-pay | Admitting: Surgery

## 2023-02-17 LAB — TYPE AND SCREEN
ABO/RH(D): O POS
Antibody Screen: NEGATIVE
Unit division: 0
Unit division: 0
Unit division: 0
Unit division: 0
Unit division: 0
Unit division: 0

## 2023-02-17 LAB — HEMOGLOBIN AND HEMATOCRIT, BLOOD
HCT: 36.3 % — ABNORMAL LOW (ref 39.0–52.0)
HCT: 28.1 % — ABNORMAL LOW (ref 39.0–52.0)
HCT: 26.2 % — ABNORMAL LOW (ref 39.0–52.0)
HCT: 23.4 % — ABNORMAL LOW (ref 39.0–52.0)
Hemoglobin: 12.1 g/dL — ABNORMAL LOW (ref 13.0–17.0)
Hemoglobin: 9.6 g/dL — ABNORMAL LOW (ref 13.0–17.0)
Hemoglobin: 8.8 g/dL — ABNORMAL LOW (ref 13.0–17.0)
Hemoglobin: 8 g/dL — ABNORMAL LOW (ref 13.0–17.0)

## 2023-02-17 LAB — BPAM RBC
Blood Product Expiration Date: 202501272359
Blood Product Expiration Date: 202501272359
Blood Product Expiration Date: 202502032359
Blood Product Expiration Date: 202502042359
Blood Product Expiration Date: 202502112359
Blood Product Expiration Date: 202502112359
ISSUE DATE / TIME: 202501141945
ISSUE DATE / TIME: 202501142008
ISSUE DATE / TIME: 202501142140
ISSUE DATE / TIME: 202501142140
Unit Type and Rh: 5100
Unit Type and Rh: 5100
Unit Type and Rh: 5100
Unit Type and Rh: 5100
Unit Type and Rh: 5100
Unit Type and Rh: 5100

## 2023-02-17 LAB — PREPARE FRESH FROZEN PLASMA
Unit division: 0
Unit division: 0
Unit division: 0
Unit division: 0

## 2023-02-17 LAB — BPAM FFP
Blood Product Expiration Date: 202501192359
Blood Product Expiration Date: 202501192359
Blood Product Expiration Date: 202501192359
Blood Product Expiration Date: 202501192359
Unit Type and Rh: 6200
Unit Type and Rh: 6200
Unit Type and Rh: 6200
Unit Type and Rh: 6200

## 2023-02-17 LAB — GLUCOSE, CAPILLARY
Glucose-Capillary: 139 mg/dL — ABNORMAL HIGH (ref 70–99)
Glucose-Capillary: 143 mg/dL — ABNORMAL HIGH (ref 70–99)
Glucose-Capillary: 150 mg/dL — ABNORMAL HIGH (ref 70–99)
Glucose-Capillary: 166 mg/dL — ABNORMAL HIGH (ref 70–99)
Glucose-Capillary: 190 mg/dL — ABNORMAL HIGH (ref 70–99)
Glucose-Capillary: 199 mg/dL — ABNORMAL HIGH (ref 70–99)
Glucose-Capillary: 227 mg/dL — ABNORMAL HIGH (ref 70–99)

## 2023-02-17 LAB — MRSA NEXT GEN BY PCR, NASAL: MRSA by PCR Next Gen: NOT DETECTED

## 2023-02-17 LAB — RENAL FUNCTION PANEL
Albumin: 2.6 g/dL — ABNORMAL LOW (ref 3.5–5.0)
Anion gap: 7 (ref 5–15)
BUN: 10 mg/dL (ref 8–23)
CO2: 24 mmol/L (ref 22–32)
Calcium: 8.1 mg/dL — ABNORMAL LOW (ref 8.9–10.3)
Chloride: 108 mmol/L (ref 98–111)
Creatinine, Ser: 0.99 mg/dL (ref 0.61–1.24)
GFR, Estimated: >60 mL/min (ref >60)
Glucose, Bld: 206 mg/dL — ABNORMAL HIGH (ref 70–99)
Phosphorus: 2.7 mg/dL (ref 2.5–4.6)
Potassium: 4.9 mmol/L (ref 3.5–5.1)
Sodium: 139 mmol/L (ref 135–145)

## 2023-02-17 LAB — CBC
HCT: 30.5 % — ABNORMAL LOW (ref 39.0–52.0)
Hemoglobin: 10.4 g/dL — ABNORMAL LOW (ref 13.0–17.0)
MCH: 29.5 pg (ref 26.0–34.0)
MCHC: 34.1 g/dL (ref 30.0–36.0)
MCV: 86.4 fL (ref 80.0–100.0)
Platelets: 223 10*3/uL (ref 150–400)
RBC: 3.53 MIL/uL — ABNORMAL LOW (ref 4.22–5.81)
RDW: 15.9 % — ABNORMAL HIGH (ref 11.5–15.5)
WBC: 18.7 10*3/uL — ABNORMAL HIGH (ref 4.0–10.5)
nRBC: 0 % (ref 0.0–0.2)

## 2023-02-17 LAB — HIV ANTIBODY (ROUTINE TESTING W REFLEX): HIV Screen 4th Generation wRfx: NONREACTIVE

## 2023-02-17 LAB — PREPARE RBC (CROSSMATCH)

## 2023-02-17 LAB — MASSIVE TRANSFUSION PROTOCOL ORDER (BLOOD BANK NOTIFICATION)

## 2023-02-17 LAB — MAGNESIUM: Magnesium: 1.7 mg/dL (ref 1.7–2.4)

## 2023-02-17 MED ORDER — INFLUENZA VIRUS VACC SPLIT PF (FLUZONE) 0.5 ML IM SUSY: 0.5000 mL | PREFILLED_SYRINGE | INTRAMUSCULAR | Status: DC

## 2023-02-17 NOTE — Progress Notes (Signed)
 [facility] SURGICAL PROGRESS NOTE  Hospital Day(s): 1.   Post op day(s): 1 Day Post-Op.   Interval History:  Patient seen and examined No acute events or new complaints overnight.  No vasopressor need  Patient reports he is feeling well; anxious to go home No rectal pain; no further bleeding noted Leukocytosis to 18.7K; likely reactive from surgery Hgb to 10.4 (from 12.1) Renal function normal; sCr - 0.99; UO - 1200 ccs No electrolyte derangements NPO this AM  Vital signs in last 24 hours: [min-max] current  Temp:  [97.3 F (36.3 C)-98.5 F (36.9 C)] 98.5 F (36.9 C) (01/15 0747) Pulse Rate:  [90-120] 94 (01/15 0700) Resp:  [11-24] 14 (01/15 0700) BP: (79-134)/(56-97) 126/64 (01/15 0700) SpO2:  [95 %-100 %] 100 % (01/15 0745) Weight:  [102.1 kg-105.2 kg] 105.2 kg (01/15 0438)     Height: 5\' 10"  (177.8 cm) Weight: 105.2 kg BMI (Calculated): 33.28   Intake/Output last 2 shifts:  01/14 0701 - 01/15 0700 In: 2740 [I.V.:1738.7; Blood:951.3; IV Piggyback:50] Out: 1200 [Urine:1200]   Physical Exam:  Constitutional: alert, cooperative and no distress  Respiratory: breathing non-labored at rest  Cardiovascular: regular rate and sinus rhythm  Rectal: Bedside RN present as chaperone, no evidence of bleeding nor old blood on examination, no pain. There is scant drainage consistent with hemostatic agents utilized operatively   Labs:     Latest Ref Rng & Units 02/17/2023    5:00 AM 02/16/2023   10:38 PM 02/16/2023    8:46 PM  CBC  WBC 4.0 - 10.5 K/uL 18.7     Hemoglobin 13.0 - 17.0 g/dL 16.1  09.6  04.5   Hematocrit 39.0 - 52.0 % 30.5  36.3  33.0   Platelets 150 - 400 K/uL 223  247        Latest Ref Rng & Units 02/17/2023    5:00 AM 02/16/2023    8:46 PM 02/16/2023    5:59 PM  CMP  Glucose 70 - 99 mg/dL 409  811  914   BUN 8 - 23 mg/dL 10  6  7    Creatinine 0.61 - 1.24 mg/dL 7.82  9.56  2.13   Sodium 135 - 145 mmol/L 139  141  138   Potassium 3.5 - 5.1  mmol/L 4.9  4.4  4.4   Chloride 98 - 111 mmol/L 108  105  101   CO2 22 - 32 mmol/L 24   26   Calcium  8.9 - 10.3 mg/dL 8.1   8.2   Total Protein 6.5 - 8.1 g/dL   5.5   Total Bilirubin 0.0 - 1.2 mg/dL   0.6   Alkaline Phos 38 - 126 U/L   55   AST 15 - 41 U/L   11   ALT 0 - 44 U/L   11      Imaging studies: No new pertinent imaging studies   Assessment/Plan:  63 y.o. male without evidence of further bleeding 1 Day Post-Op s/p EUA and control of bleeding from hemorrhoidectomy site    - No evidence of bleeding this AM on examination, Hgb remains stable, and he is maintaining hemodynamics. I do not think he warrants any further intervention currently. If he were to re-bleeding, may need embolization  - Will do CLD; advance as tolerated  - Monitor H&H; stable - no further bleeding this AM  - Pain control prn; antiemetics prn  - Okay for activity as tolerated  - Stable for floor status     -  Discharge Planning: Doing well this AM, no evidence of bleeding, hemodynamics stable. Recommend at least 24 hours observation to ensure no re-bleeding prior to considering DC home.    All of the above findings and recommendations were discussed with the patient, and the medical team, and all of patient's questions were answered to his expressed satisfaction.  -- Apolonio Bay, PA-C [facility] 02/17/2023, 7:55 AM M-F: 7am - 4pm

## 2023-02-17 NOTE — Plan of Care (Signed)

## 2023-02-17 NOTE — Anesthesia Postprocedure Evaluation (Signed)
 Anesthesia Post Note  Patient: Victor Alvarez  Procedure(s) Performed: RECTAL EXAM UNDER ANESTHESIA (Rectum)  Patient location during evaluation: ICU Anesthesia Type: General Level of consciousness: awake and alert Pain management: pain level controlled Vital Signs Assessment: post-procedure vital signs reviewed and stable Respiratory status: spontaneous breathing, nonlabored ventilation, respiratory function stable and patient connected to nasal cannula oxygen Cardiovascular status: blood pressure returned to baseline and stable Postop Assessment: no apparent nausea or vomiting Anesthetic complications: no   No notable events documented.   Last Vitals:  Vitals:   02/17/23 0600 02/17/23 0700  BP: 104/71 126/64  Pulse: (!) 105 94  Resp: (!) 24 14  Temp:    SpO2: 97% 96%    Last Pain:  Vitals:   02/17/23 0400  TempSrc:   PainSc: 0-No pain                 Sheliah Deutscher C

## 2023-02-17 NOTE — Consult Note (Signed)
 PHARMACY CONSULT NOTE - FOLLOW UP  Pharmacy Consult for Electrolyte Monitoring and Replacement   Recent Labs: Potassium (mmol/L)  Date Value  02/17/2023 4.9   Magnesium (mg/dL)  Date Value  95/28/4132 1.7   Calcium  (mg/dL)  Date Value  44/02/270 8.1 (L)   Albumin (g/dL)  Date Value  53/66/4403 2.6 (L)  12/10/2022 4.6   Phosphorus (mg/dL)  Date Value  47/42/5956 2.7   Sodium (mmol/L)  Date Value  02/17/2023 139  12/10/2022 139     Assessment: 63 y.o. male status post hemorrhoidectomy 6 days ago by Dr. Ofilia Benton. Patient reports that he started having rectal bleeding today it has been pretty massive and has had at least 3-4 bright red bloody bowel movements. Pt was hypotensive. Pharmacy consulted to manage electrolytes.    Goal of Therapy:  WNL  Plan:  No replacement needed at this time F/u with AM labs.   Han Vejar A Evart Mcdonnell ,PharmD Clinical Pharmacist 02/17/2023 7:36 AM

## 2023-02-18 LAB — CBC
HCT: 23.5 % — ABNORMAL LOW (ref 39.0–52.0)
Hemoglobin: 8 g/dL — ABNORMAL LOW (ref 13.0–17.0)
MCH: 30.3 pg (ref 26.0–34.0)
MCHC: 34 g/dL (ref 30.0–36.0)
MCV: 89 fL (ref 80.0–100.0)
Platelets: 232 10*3/uL (ref 150–400)
RBC: 2.64 MIL/uL — ABNORMAL LOW (ref 4.22–5.81)
RDW: 15.3 % (ref 11.5–15.5)
WBC: 15.2 10*3/uL — ABNORMAL HIGH (ref 4.0–10.5)
nRBC: 0.2 % (ref 0.0–0.2)

## 2023-02-18 LAB — GLUCOSE, CAPILLARY
Glucose-Capillary: 133 mg/dL — ABNORMAL HIGH (ref 70–99)
Glucose-Capillary: 134 mg/dL — ABNORMAL HIGH (ref 70–99)
Glucose-Capillary: 133 mg/dL — ABNORMAL HIGH (ref 70–99)

## 2023-02-18 LAB — HEMOGLOBIN AND HEMATOCRIT, BLOOD
HCT: 23.8 % — ABNORMAL LOW (ref 39.0–52.0)
Hemoglobin: 8.1 g/dL — ABNORMAL LOW (ref 13.0–17.0)

## 2023-02-18 NOTE — Plan of Care (Signed)
  Problem: Clinical Measurements: Goal: Ability to maintain clinical measurements within normal limits will improve Outcome: Progressing Goal: Will remain free from infection Outcome: Progressing Goal: Respiratory complications will improve Outcome: Progressing Goal: Cardiovascular complication will be avoided Outcome: Progressing   Problem: Elimination: Goal: Will not experience complications related to urinary retention Outcome: Progressing   Problem: Health Behavior/Discharge Planning: Goal: Ability to manage health-related needs will improve Outcome: Progressing   Problem: Pain Management: Goal: General experience of comfort will improve Outcome: Not Progressing

## 2023-02-18 NOTE — TOC CM/SW Note (Signed)
Transition of Care [facility]) - Inpatient Brief Assessment   Patient Details  Name: Victor Alvarez MRN: [id number] Date of Birth: May 23, 1960  [facility]) CM/SW Contact:    Margarito Liner, LCSW Phone Number: 02/18/2023, 12:07 PM   Clinical Narrative: Patient has orders to discharge home today. Chart reviewed. No TOC needs identified. CSW signing off.  Transition of Care Asessment: Insurance and Status: Insurance coverage has been reviewed Patient has primary care physician: Yes Home environment has been reviewed: Single family home Prior level of function:: Not documented   Social Drivers of Health Review: SDOH reviewed no interventions necessary Readmission risk has been reviewed: Yes Transition of care needs: no transition of care needs at this time

## 2023-02-18 NOTE — Progress Notes (Signed)
Brief Progress Note Seen and examined this AM. No issues overnight. Hgb has slowly trended down and now stable x2 at 8.0. Suspect this is equilibration after bleeding. He has had no further evidence of bleeding since surgery. He is tolerating PO. No other complaints  Hemodynamics stable.  Chaperone present on examination; rectal examination is benign; no bleeding, no old blood.   We will plan to recheck H&H at 1100. If this is stable or improved, we will DC home this afternoon with close follow up. Patient in agreement  Formal note to follow  -- Victor Oxford, PA-C [facility] 02/18/2023, 8:36 AM M-F: 7am - 4pm

## 2023-02-18 NOTE — Discharge Summary (Signed)
[facility] SURGICAL DISCHARGE SUMMARY  Patient ID: Victor Alvarez MRN: [id number] DOB/AGE: 65-19-1962 65 y.o.  Admit date: 02/16/2023 Discharge date: 02/18/2023  Discharge Diagnoses Patient Active Problem List   Diagnosis Date Noted   Hemorrhagic shock (HCC) 02/16/2023   GI bleed 02/16/2023   Rectal bleeding 02/16/2023    Consultants PCCM  Procedures 02/15/2022:  EUA with control of rectal bleeding   HPI: Victor Alvarez is a 63 y.o. male who underwent hemorrhoidectomy on 01/07 presenting to the ED on 01/14 secondary to significant rectal bleeding.   Hospital Course: Informed consent was obtained and documented, and patient underwent emergent EUA with control of rectal bleeding (Dr Everlene Farrier, 02/15/2022).  Post-operatively, patient did well. He was in ICU for monitoring. He maintained hemodynamics without need for vasopressor support. Hgb did trend down but equilibrated and remains stable at 8.0 - 8.1 x3. He had no further bleeding. Advancement of patient's diet and ambulation were well-tolerated. The remainder of patient's hospital course was essentially unremarkable, and discharge planning was initiated accordingly with patient safely able to be discharged home with appropriate discharge instructions, pain control, and outpatient follow-up after all of his questions were answered to his expressed satisfaction.   Discharge Condition: Good    Allergies as of 02/18/2023       Reactions   Codeine Nausea And Vomiting, Other (See Comments), Nausea Only   Metformin Other (See Comments)   unsure        Medication List     TAKE these medications    albuterol 108 (90 Base) MCG/ACT inhaler Commonly known as: VENTOLIN HFA Inhale 2 puffs into the lungs every 6 (six) hours as needed for wheezing or shortness of breath.   ALPRAZolam 1 MG tablet Commonly known as: XANAX Take 1 mg by mouth 3 (three) times daily as needed for anxiety.   ARIPiprazole 10 MG tablet Commonly  known as: Abilify Take 1 tablet (10 mg total) by mouth daily.   aspirin EC 81 MG tablet Take 81 mg by mouth daily.   B-complex with vitamin C tablet Take 1 tablet by mouth daily.   busPIRone 30 MG tablet Commonly known as: BUSPAR Take 1 tablet (30 mg total) by mouth 2 (two) times daily.   cariprazine 3 MG capsule Commonly known as: Vraylar Take 1 capsule (3 mg total) by mouth daily.   divalproex 250 MG DR tablet Commonly known as: DEPAKOTE Take 250 mg by mouth 2 (two) times daily.   fluticasone 50 MCG/ACT nasal spray Commonly known as: FLONASE Place 2 sprays into both nostrils daily. sometimes uses in the evening as needed   HYDROcodone-acetaminophen 5-325 MG tablet Commonly known as: NORCO/VICODIN Take 1 tablet by mouth every 6 (six) hours as needed for moderate pain (pain score 4-6).   ibuprofen 800 MG tablet Commonly known as: ADVIL Take 1 tablet (800 mg total) by mouth every 8 (eight) hours as needed.   [employer] Blood Glucose Test test strip Generic drug: glucose blood Use to test glucose once daily   metoprolol succinate 25 MG 24 hr tablet Commonly known as: TOPROL-XL Take 25 mg by mouth daily.   omeprazole 40 MG capsule Commonly known as: PRILOSEC Take 40 mg by mouth daily.   paliperidone 6 MG 24 hr tablet Commonly known as: INVEGA Take 6 mg by mouth every morning.   prazosin 5 MG capsule Commonly known as: MINIPRESS Take 5 mg by mouth at bedtime.   promethazine 25 MG tablet Commonly known as: PHENERGAN Take 25 mg  by mouth every 8 (eight) hours as needed for vomiting or nausea.   rosuvastatin 20 MG tablet Commonly known as: Crestor Take 1 tablet (20 mg total) by mouth daily.   Stiolto Respimat 2.5-2.5 MCG/ACT Aers Generic drug: Tiotropium Bromide-Olodaterol Inhale 2 puffs into the lungs daily.   sucralfate 1 g tablet Commonly known as: CARAFATE Take 1 g by mouth 4 (four) times daily -  with meals and at bedtime.   traZODone 100 MG  tablet Commonly known as: DESYREL Take 300 mg by mouth at bedtime.   Viibryd 40 MG Tabs Generic drug: Vilazodone HCl Take 40 mg by mouth daily.   Vitamin D3 50 MCG (2000 UT) capsule Take 2,000 Units by mouth daily.   zolpidem 10 MG tablet Commonly known as: AMBIEN Take 10 mg by mouth at bedtime.          Follow-up Information     Campbell Lerner, MD. Go on 02/25/2023.   Specialty: General Surgery Why: Go to appointment on 01/23 at 200 PM Contact information: [address] [place] [address] [phone]                  Time spent on discharge management including discussion of hospital course, clinical condition, outpatient instructions, prescriptions, and follow up with the patient and members of the medical team: >30 minutes  -- Lynden Oxford , PA-C [facility]  02/18/2023, 11:50 AM [phone] M-F: 7am - 4pm

## 2023-02-18 NOTE — Progress Notes (Signed)
Discharge instructions given to and reviewed with patient by Verlon Au, RN. Patient discharged from hospital with his brother.

## 2023-02-19 ENCOUNTER — Telehealth: Payer: Self-pay | Admitting: Physician Assistant

## 2023-02-19 ENCOUNTER — Ambulatory Visit (INDEPENDENT_AMBULATORY_CARE_PROVIDER_SITE_OTHER): Payer: MEDICAID | Admitting: Physician Assistant

## 2023-02-19 ENCOUNTER — Encounter: Payer: Self-pay | Admitting: Physician Assistant

## 2023-02-19 VITALS — BP 109/60 | HR 90 | Ht 70.0 in | Wt 227.0 lb

## 2023-02-19 DIAGNOSIS — D649 Anemia, unspecified: Secondary | ICD-10-CM

## 2023-02-19 DIAGNOSIS — E669 Obesity, unspecified: Secondary | ICD-10-CM | POA: Diagnosis not present

## 2023-02-19 DIAGNOSIS — E1169 Type 2 diabetes mellitus with other specified complication: Secondary | ICD-10-CM | POA: Diagnosis not present

## 2023-02-19 DIAGNOSIS — F339 Major depressive disorder, recurrent, unspecified: Secondary | ICD-10-CM

## 2023-02-19 DIAGNOSIS — Z6832 Body mass index (BMI) 32.0-32.9, adult: Secondary | ICD-10-CM

## 2023-02-19 DIAGNOSIS — E119 Type 2 diabetes mellitus without complications: Secondary | ICD-10-CM

## 2023-02-19 DIAGNOSIS — F4310 Post-traumatic stress disorder, unspecified: Secondary | ICD-10-CM | POA: Insufficient documentation

## 2023-02-19 NOTE — Progress Notes (Signed)
Established patient visit  Patient: Victor Alvarez   DOB: 06-03-1960   63 y.o. Male  MRN: [id number] Visit Date: 02/19/2023  Today's healthcare provider: Debera Lat, PA-C   Chief Complaint  Patient presents with   Follow-up    6 weeks f/u Rectal bleeding--still fatigue, eating less, little pain. Denied bleeding. Pt requesting--psychiatry  referral for mental meds refill.   Subjective      Discussed the use of AI scribe software for clinical note transcription with the patient, who gave verbal consent to proceed.  History of Present Illness   The patient, with a history of rectal bleeding, anemia, and diabetes, presents for a follow-up visit after recent surgery. The patient reports being told he has diabetes after his second surgery and has been receiving insulin in the hospital, despite not taking any diabetic medication prior. The patient expresses dislike for metformin, which was previously considered for his early-stage diabetes. The patient also reports feeling dizzy and weak, which may be related to his anemia.  In addition to his physical health concerns, the patient is dealing with mental health issues. He has been out of his mental health medication for about two months and is seeking a new provider as his previous one does not accept his insurance. The patient is currently taking Abilify and Buspar, and has previously taken Xanax, which he believes helps him the most. However, he expresses willingness to change his medication regimen.           01/08/2023    9:49 AM 12/10/2022   10:21 AM  Depression screen PHQ 2/9  Decreased Interest 1 1  Down, Depressed, Hopeless 1 0  PHQ - 2 Score 2 1  Altered sleeping 1 3  Tired, decreased energy 2 1  Change in appetite 2 1  Feeling bad or failure about yourself  3 1  Trouble concentrating 1 1  Moving slowly or fidgety/restless 2 0  Suicidal thoughts 1 0  PHQ-9 Score 14 8  Difficult doing work/chores Somewhat difficult Somewhat  difficult      01/08/2023    9:49 AM 12/10/2022    1:56 PM  GAD 7 : Generalized Anxiety Score  Nervous, Anxious, on Edge 2 3  Control/stop worrying 2 2  Worry too much - different things 2 2  Trouble relaxing 2 2  Restless 2 3  Easily annoyed or irritable 1 0  Afraid - awful might happen 3 2  Total GAD 7 Score 14 14  Anxiety Difficulty Somewhat difficult Somewhat difficult    Medications: Outpatient Medications Prior to Visit  Medication Sig   albuterol (VENTOLIN HFA) 108 (90 Base) MCG/ACT inhaler Inhale 2 puffs into the lungs every 6 (six) hours as needed for wheezing or shortness of breath.   ALPRAZolam (XANAX) 1 MG tablet Take 1 mg by mouth 3 (three) times daily as needed for anxiety.   ARIPiprazole (ABILIFY) 10 MG tablet Take 1 tablet (10 mg total) by mouth daily.   aspirin 81 MG EC tablet Take 81 mg by mouth daily.   B Complex-C (B-COMPLEX WITH VITAMIN C) tablet Take 1 tablet by mouth daily.   busPIRone (BUSPAR) 30 MG tablet Take 1 tablet (30 mg total) by mouth 2 (two) times daily.   cariprazine (VRAYLAR) 3 MG capsule Take 1 capsule (3 mg total) by mouth daily.   Cholecalciferol (VITAMIN D3) 50 MCG (2000 UT) capsule Take 2,000 Units by mouth daily.   divalproex (DEPAKOTE) 250 MG DR tablet Take 250 mg by mouth 2 (  two) times daily.   fluticasone (FLONASE) 50 MCG/ACT nasal spray Place 2 sprays into both nostrils daily. sometimes uses in the evening as needed   glucose blood ([employer] BLOOD GLUCOSE TEST) test strip Use to test glucose once daily   ibuprofen (ADVIL) 800 MG tablet Take 1 tablet (800 mg total) by mouth every 8 (eight) hours as needed.   metoprolol succinate (TOPROL-XL) 25 MG 24 hr tablet Take 25 mg by mouth daily.   omeprazole (PRILOSEC) 40 MG capsule Take 40 mg by mouth daily.   paliperidone (INVEGA) 6 MG 24 hr tablet Take 6 mg by mouth every morning.   prazosin (MINIPRESS) 5 MG capsule Take 5 mg by mouth at bedtime.   promethazine (PHENERGAN) 25 MG tablet Take 25 mg  by mouth every 8 (eight) hours as needed for vomiting or nausea.   rosuvastatin (CRESTOR) 20 MG tablet Take 1 tablet (20 mg total) by mouth daily.   sucralfate (CARAFATE) 1 g tablet Take 1 g by mouth 4 (four) times daily -  with meals and at bedtime.   Tiotropium Bromide-Olodaterol (STIOLTO RESPIMAT) 2.5-2.5 MCG/ACT AERS Inhale 2 puffs into the lungs daily.   traZODone (DESYREL) 100 MG tablet Take 300 mg by mouth at bedtime.   VIIBRYD 40 MG TABS Take 40 mg by mouth daily.   zolpidem (AMBIEN) 10 MG tablet Take 10 mg by mouth at bedtime.   HYDROcodone-acetaminophen (NORCO/VICODIN) 5-325 MG tablet Take 1 tablet by mouth every 6 (six) hours as needed for moderate pain (pain score 4-6). (Patient not taking: Reported on 02/19/2023)   No facility-administered medications prior to visit.    Review of Systems All negative Except see HPI       Objective    BP 109/60 (BP Location: Left Arm, Patient Position: Sitting)   Pulse 90   Ht 5\' 10"  (1.778 m)   Wt 227 lb (103 kg)   SpO2 100%   BMI 32.57 kg/m     Physical Exam Vitals reviewed.  Constitutional:      General: He is not in acute distress.    Appearance: Normal appearance. He is not diaphoretic.  HENT:     Head: Normocephalic and atraumatic.  Eyes:     General: No scleral icterus.    Conjunctiva/sclera: Conjunctivae normal.  Cardiovascular:     Rate and Rhythm: Normal rate and regular rhythm.     Pulses: Normal pulses.     Heart sounds: Normal heart sounds. No murmur heard. Pulmonary:     Effort: Pulmonary effort is normal. No respiratory distress.     Breath sounds: Normal breath sounds. No wheezing or rhonchi.  Musculoskeletal:     Cervical back: Neck supple.     Right lower leg: No edema.     Left lower leg: No edema.  Lymphadenopathy:     Cervical: No cervical adenopathy.  Skin:    General: Skin is warm and dry.     Findings: No rash.  Neurological:     Mental Status: He is alert and oriented to person, place, and  time. Mental status is at baseline.  Psychiatric:        Mood and Affect: Mood normal.        Behavior: Behavior normal.      No results found for any visits on 02/19/23.      Assessment and Plan    Rectal Bleeding Recent hospitalization for rectal bleeding with subsequent anemia. Patient is scheduled to follow up with surgeon on 02/26/2023. No  current bleeding reported. -Continue to monitor for any recurrence of bleeding and report to surgeon as needed. Will do cbc and bmp  Anemia Likely secondary to recent rectal bleeding. Patient received blood transfusion during hospitalization. -Order lab work to check current hemoglobin levels. If levels remain low, consider additional transfusion.  Diabetes Patient was given insulin in the hospital, but typically does not take any diabetic medications. A1c two months ago indicated early stage diabetes. Patient reported not tolerating Metformin well. -Consider alternative oral hypoglycemic agent. Will reassess after blood work will be back -Advise patient to monitor blood sugar levels daily. -Encourage low carbohydrate diet and regular exercise.  Hypertension Patient reports blood pressure has been low since recent hospitalization. -Monitor blood pressure closely, especially in the context of recent bleeding and anemia.  Mental Health Patient has been off Vraylar for 3-4 months due to insurance issues. Patient also takes Abilify ?and Buspar. Patient reports Xanax is most helpful, but is open to changing medications. he seems confused what medications he is currently taking. -Refill Buspar temporarily. -Refer to [facility] for further management of mental health medications. -Advise patient to contact insurance to clarify coverage for mental health services. -will refer to chronic care managment for polypharmacy and medication compliance  Follow-up Schedule appointment in 10 days to review all medications and discuss results of lab  work. If patient is able to meet with pharmacist and adjust medications, follow-up appointment may not be necessary.        Orders Placed This Encounter  Procedures   CBC w/Diff/Platelet   Basic metabolic panel   AMB Referral [facility] Care Management    Referral Priority:   Routine    Referral Type:   Consultation    Referral Reason:   Care Coordination    Number of Visits Requested:   1   Ambulatory referral to Psychiatry    Referral Priority:   Urgent    Referral Type:   Psychiatric    Referral Reason:   Specialty Services Required    Requested Specialty:   Psychiatry    Number of Visits Requested:   1    Return in about 10 days (around 03/01/2023) for chronic disease f/u.   The patient was advised to call back or seek an in-person evaluation if the symptoms worsen or if the condition fails to improve as anticipated.  I discussed the assessment and treatment plan with the patient. The patient was provided an opportunity to ask questions and all were answered. The patient agreed with the plan and demonstrated an understanding of the instructions.  I, Debera Lat, PA-C have reviewed all documentation for this visit. The documentation on 02/19/2023  for the exam, diagnosis, procedures, and orders are all accurate and complete.  Debera Lat, [facility], MMS [facility] [phone] (phone) [phone] (fax)  [facility]

## 2023-02-19 NOTE — Telephone Encounter (Signed)
Pt is calling to let Edmon Crape know that he got an appt for Monday January 20th at 8:00a with [facility].

## 2023-02-20 LAB — CBC WITH DIFFERENTIAL/PLATELET
Basophils Absolute: 0.1 10*3/uL (ref 0.0–0.2)
Basos: 1 %
EOS (ABSOLUTE): 0.2 10*3/uL (ref 0.0–0.4)
Eos: 2 %
Hematocrit: 26.2 % — ABNORMAL LOW (ref 37.5–51.0)
Hemoglobin: 8.6 g/dL — ABNORMAL LOW (ref 13.0–17.7)
Immature Grans (Abs): 0.2 10*3/uL — ABNORMAL HIGH (ref 0.0–0.1)
Immature Granulocytes: 2 %
Lymphocytes Absolute: 3.6 10*3/uL — ABNORMAL HIGH (ref 0.7–3.1)
Lymphs: 30 %
MCH: 30.3 pg (ref 26.6–33.0)
MCHC: 32.8 g/dL (ref 31.5–35.7)
MCV: 92 fL (ref 79–97)
Monocytes Absolute: 1.1 10*3/uL — ABNORMAL HIGH (ref 0.1–0.9)
Monocytes: 9 %
NRBC: 3 % — ABNORMAL HIGH (ref 0–0)
Neutrophils Absolute: 6.7 10*3/uL (ref 1.4–7.0)
Neutrophils: 56 %
Platelets: 363 10*3/uL (ref 150–450)
RBC: 2.84 x10E6/uL — ABNORMAL LOW (ref 4.14–5.80)
RDW: 14.3 % (ref 11.6–15.4)
WBC: 12 10*3/uL — ABNORMAL HIGH (ref 3.4–10.8)

## 2023-02-20 LAB — BASIC METABOLIC PANEL
BUN/Creatinine Ratio: 11 (ref 10–24)
BUN: 11 mg/dL (ref 8–27)
CO2: 24 mmol/L (ref 20–29)
Calcium: 9 mg/dL (ref 8.6–10.2)
Chloride: 103 mmol/L (ref 96–106)
Creatinine, Ser: 1.03 mg/dL (ref 0.76–1.27)
Glucose: 132 mg/dL — ABNORMAL HIGH (ref 70–99)
Potassium: 3.8 mmol/L (ref 3.5–5.2)
Sodium: 142 mmol/L (ref 134–144)
eGFR: 81 mL/min/{1.73_m2} (ref >59)

## 2023-02-23 ENCOUNTER — Encounter: Payer: Self-pay | Admitting: Student in an Organized Health Care Education/Training Program

## 2023-02-23 ENCOUNTER — Ambulatory Visit: Payer: MEDICAID | Admitting: Student in an Organized Health Care Education/Training Program

## 2023-02-23 VITALS — BP 122/60 | HR 83 | Temp 97.6°F | Ht 70.0 in | Wt 229.4 lb

## 2023-02-23 DIAGNOSIS — J398 Other specified diseases of upper respiratory tract: Secondary | ICD-10-CM | POA: Diagnosis not present

## 2023-02-23 DIAGNOSIS — F17200 Nicotine dependence, unspecified, uncomplicated: Secondary | ICD-10-CM

## 2023-02-23 DIAGNOSIS — R0602 Shortness of breath: Secondary | ICD-10-CM

## 2023-02-23 DIAGNOSIS — F17210 Nicotine dependence, cigarettes, uncomplicated: Secondary | ICD-10-CM

## 2023-02-23 DIAGNOSIS — G4733 Obstructive sleep apnea (adult) (pediatric): Secondary | ICD-10-CM | POA: Diagnosis not present

## 2023-02-23 NOTE — Progress Notes (Signed)
Assessment & Plan:   #Shortness of breath #Emphysema #Tracheobronchomalacia #Tobacco Use Disorder  Presents for follow up of shortness of breath with history of smoking and findings of emphysema and bronchial wall thickening on HRCT suggestive of COPD. He does not have any signs of ILD on the imaging study. HRCT is however notable for tracheobronchomalacia that is likely contributing to his symptoms (including the coughing fits). He was switched to LABA/LAMA therapy during our previous visit with improvement in his symptoms.  His exam and history are suggestive of sleep apnea and he was previously on CPAP.  Treatment for sleep apnea will likely help with TBM by stenting the airway and we will initiate the workup (see below). Will consider adding a clearance regimen as well as ICS to his treatment plan after we obtain PFT's and the sleep study. Also discussed purse lip breathing today to help with coughing fits. Finally, counseled the patient on the importance of full smoking cessation. He will continue to work to quit fully. Finally, will consider referral to [facility] for thoracic surgery evaluation for TBM after we've obtained the PFT's.  -PFT's -smoking cessation recommended  #OSA (obstructive sleep apnea)  Reports a history of sleep apnea but is not currently on CPAP. On exam he has a large neck circumference with a crowded oropharynx. He also reports apneic episodes and heavy snoring at night. He does not feel rested after his sleep. Given high risk for sleep apnea will proceed with obtaining a split night sleep study.  - Split night study; Future   Return in about 3 months (around 05/24/2023).  I spent 33 minutes caring for this patient today, including preparing to see the patient, obtaining a medical history , reviewing a separately obtained history, performing a medically appropriate examination and/or evaluation, counseling and educating the patient/family/caregiver, ordering  medications, tests, or procedures, documenting clinical information in the electronic health record, and independently interpreting results (not separately reported/billed) and communicating results to the patient/family/caregiver. 3 minutes spent counseling the patient on smoking cessation.  Raechel Chute, MD [facility] 02/23/2023 12:54 PM    End of visit medications:  No orders of the defined types were placed in this encounter.    Current Outpatient Medications:    albuterol (VENTOLIN HFA) 108 (90 Base) MCG/ACT inhaler, Inhale 2 puffs into the lungs every 6 (six) hours as needed for wheezing or shortness of breath., Disp: , Rfl:    ALPRAZolam (XANAX) 1 MG tablet, Take 1 mg by mouth 3 (three) times daily as needed for anxiety., Disp: , Rfl:    ARIPiprazole (ABILIFY) 10 MG tablet, Take 1 tablet (10 mg total) by mouth daily., Disp: 30 tablet, Rfl: 0   aspirin 81 MG EC tablet, Take 81 mg by mouth daily., Disp: , Rfl:    B Complex-C (B-COMPLEX WITH VITAMIN C) tablet, Take 1 tablet by mouth daily., Disp: , Rfl:    busPIRone (BUSPAR) 30 MG tablet, Take 1 tablet (30 mg total) by mouth 2 (two) times daily., Disp: 60 tablet, Rfl: 3   cariprazine (VRAYLAR) 3 MG capsule, Take 1 capsule (3 mg total) by mouth daily., Disp: 30 capsule, Rfl: 0   Cholecalciferol (VITAMIN D3) 50 MCG (2000 UT) capsule, Take 2,000 Units by mouth daily., Disp: , Rfl:    divalproex (DEPAKOTE) 250 MG DR tablet, Take 250 mg by mouth 2 (two) times daily., Disp: , Rfl:    fluticasone (FLONASE) 50 MCG/ACT nasal spray, Place 2 sprays into both nostrils daily. sometimes  uses in the evening as needed, Disp: , Rfl:    glucose blood ([employer] BLOOD GLUCOSE TEST) test strip, Use to test glucose once daily, Disp: , Rfl:    HYDROcodone-acetaminophen (NORCO/VICODIN) 5-325 MG tablet, Take 1 tablet by mouth every 6 (six) hours as needed for moderate pain (pain score 4-6)., Disp: 15 tablet, Rfl: 0   ibuprofen (ADVIL) 800 MG  tablet, Take 1 tablet (800 mg total) by mouth every 8 (eight) hours as needed., Disp: 30 tablet, Rfl: 0   metoprolol succinate (TOPROL-XL) 25 MG 24 hr tablet, Take 25 mg by mouth daily., Disp: , Rfl:    omeprazole (PRILOSEC) 40 MG capsule, Take 40 mg by mouth daily., Disp: , Rfl:    paliperidone (INVEGA) 6 MG 24 hr tablet, Take 6 mg by mouth every morning., Disp: , Rfl:    prazosin (MINIPRESS) 5 MG capsule, Take 5 mg by mouth at bedtime., Disp: , Rfl:    promethazine (PHENERGAN) 25 MG tablet, Take 25 mg by mouth every 8 (eight) hours as needed for vomiting or nausea., Disp: , Rfl:    rosuvastatin (CRESTOR) 20 MG tablet, Take 1 tablet (20 mg total) by mouth daily., Disp: 90 tablet, Rfl: 1   sucralfate (CARAFATE) 1 g tablet, Take 1 g by mouth 4 (four) times daily -  with meals and at bedtime., Disp: , Rfl:    Tiotropium Bromide-Olodaterol (STIOLTO RESPIMAT) 2.5-2.5 MCG/ACT AERS, Inhale 2 puffs into the lungs daily., Disp: 60 each, Rfl: 12   traZODone (DESYREL) 100 MG tablet, Take 300 mg by mouth at bedtime., Disp: , Rfl:    VIIBRYD 40 MG TABS, Take 40 mg by mouth daily., Disp: , Rfl:    zolpidem (AMBIEN) 10 MG tablet, Take 10 mg by mouth at bedtime., Disp: , Rfl:    Subjective:   PATIENT ID: Victor Alvarez GENDER: male DOB: 01-Apr-1960, MRN: [id number]  Chief Complaint  Patient presents with   Follow-up    Shortness of breath on exertion. Cough. no wheezing.     HPI  Patient is a pleasant 63 year old male with a past medical history of diabetes, HTN, HLD, and depression/anxiety who presents for follow up of shortness of breath.  I first met Victor Alvarez in December of 2024, at which point we restarted his inhalers, switching him to LABA/LAMA therapy. I also ordered PFT's (he is yet to have) and HRCT to better evaluate lung parenchyma. He is here to discuss results from the CT. He is feeling better overall, with improvement in shortness of breath and cough. He continues to have an occasional coughing  fits and wheezing. He is currently staying with his uncle and is told he coughs at night, snores heavily, and stops breathing at night. He continues to smoke, but is trying to quit and has decreased his tobacco consumption to a 1/4 pack a day.   Patient reports long standing symptoms of shortness of breath that is mostly exertional. He does not feel said symptoms at rest. He has a reported history of COPD. He was also previously told he has sleep apnea.    Patient has a history of cigarette smoking, with 1 pack a day for over 50 years. He is actively smoking 1/4 pack a day. He works as a Visual merchandiser (tobacco, grain, farm animals).   Ancillary information including prior medications, full medical/surgical/family/social histories, and PFTs (when available) are listed below and have been reviewed.   Review of Systems  Constitutional:  Negative for chills, diaphoresis, fever,  malaise/fatigue and weight loss.  Respiratory:  Positive for cough, sputum production, shortness of breath and wheezing. Negative for hemoptysis.   Cardiovascular:  Negative for chest pain and palpitations.     Objective:   Vitals:   02/23/23 0919  BP: 122/60  Pulse: 83  Temp: 97.6 F (36.4 C)  TempSrc: Temporal  SpO2: 98%  Weight: 229 lb 6.4 oz (104.1 kg)  Height: 5\' 10"  (1.778 m)   98% on RA  BMI Readings from Last 3 Encounters:  02/23/23 32.92 kg/m  02/19/23 32.57 kg/m  02/18/23 33.69 kg/m   Wt Readings from Last 3 Encounters:  02/23/23 229 lb 6.4 oz (104.1 kg)  02/19/23 227 lb (103 kg)  02/18/23 234 lb 12.6 oz (106.5 kg)    Physical Exam Constitutional:      Appearance: Normal appearance.  Cardiovascular:     Rate and Rhythm: Normal rate and regular rhythm.     Pulses: Normal pulses.     Heart sounds: Normal heart sounds.  Pulmonary:     Effort: No respiratory distress.     Breath sounds: Wheezing present. No rhonchi.  Abdominal:     Palpations: Abdomen is soft.  Neurological:     General: No  focal deficit present.     Mental Status: He is alert and oriented to person, place, and time. Mental status is at baseline.       Ancillary Information    Past Medical History:  Diagnosis Date   Acute appendicitis 08/24/2019   Allergy    Arthritis    Bipolar affective disorder (HCC)    Colon polyp    COPD (chronic obstructive pulmonary disease) (HCC)    Depression    GERD (gastroesophageal reflux disease)    Hemorrhoids    Hypercholesterolemia    Mixed hyperlipidemia    Neuromuscular disorder (HCC)    PTSD (post-traumatic stress disorder)    Right wrist fracture 03/25/2015   Sleep apnea    Type 2 diabetes mellitus without complication, without long-term current use of insulin (HCC)      No family history on file.   Past Surgical History:  Procedure Laterality Date   CATARACT EXTRACTION W/ INTRAOCULAR LENS IMPLANT Right    COLONOSCOPY WITH PROPOFOL N/A 01/18/2023   Procedure: COLONOSCOPY WITH PROPOFOL;  Surgeon: Wyline Mood, MD;  Location: [facility] ENDOSCOPY;  Service: Gastroenterology;  Laterality: N/A;   HEMORRHOID SURGERY N/A 02/10/2023   Procedure: HEMORRHOIDECTOMY;  Surgeon: Campbell Lerner, MD;  Location: [facility] ORS;  Service: General;  Laterality: N/A;  surgifoam placed   HEMOSTASIS CLIP PLACEMENT  01/18/2023   Procedure: HEMOSTASIS CLIP PLACEMENT;  Surgeon: Wyline Mood, MD;  Location: [facility] ENDOSCOPY;  Service: Gastroenterology;;   MINOR FULGERATION OF ANAL CONDYLOMA N/A 02/10/2023   Procedure: MINOR FULGERATION OF ANAL CONDYLOMA;  Surgeon: Campbell Lerner, MD;  Location: [facility] ORS;  Service: General;  Laterality: N/A;   POLYPECTOMY  01/18/2023   Procedure: POLYPECTOMY;  Surgeon: Wyline Mood, MD;  Location[facility] ENDOSCOPY;  Service: Gastroenterology;;   RECTAL EXAM UNDER ANESTHESIA N/A 02/10/2023   Procedure: RECTAL EXAM UNDER ANESTHESIA;  Surgeon: Campbell Lerner, MD;  Location: [facility] ORS;  Service: General;  Laterality: N/A;   RECTAL EXAM UNDER ANESTHESIA N/A 02/16/2023    Procedure: RECTAL EXAM UNDER ANESTHESIA;  Surgeon: Leafy Ro, MD;  Location: [facility] ORS;  Service: General;  Laterality: N/A;   WRIST FRACTURE SURGERY Right 04/11/2015   XI ROBOTIC LAPAROSCOPIC ASSISTED APPENDECTOMY N/A 08/24/2019   Procedure: XI ROBOTIC LAPAROSCOPIC ASSISTED APPENDECTOMY;  Surgeon:  Campbell Lerner, MD;  Location: [facility] ORS;  Service: General;  Laterality: N/A;    Social History   Socioeconomic History   Marital status: Single    Spouse name: Not on file   Number of children: 1   Years of education: Not on file   Highest education level: Not on file  Occupational History   Not on file  Tobacco Use   Smoking status: Every Day    Current packs/day: 1.00    Average packs/day: 1 pack/day for 51.1 years (51.1 ttl pk-yrs)    Types: Cigarettes    Start date: 1974   Smokeless tobacco: Former    Types: Engineer, drilling   Vaping status: Former  Substance and Sexual Activity   Alcohol use: Not Currently   Drug use: Yes    Types: Marijuana    Comment: on occassion   Sexual activity: Not on file  Other Topics Concern   Not on file  Social History Narrative   Lives alone   Social Drivers of Health   Financial Resource Strain: Not on file  Food Insecurity: No Food Insecurity (02/16/2023)   Hunger Vital Sign    Worried About Running Out of Food in the Last Year: Never true    Ran Out of Food in the Last Year: Never true  Transportation Needs: No Transportation Needs (02/16/2023)   PRAPARE - Administrator, Civil Service (Medical): No    Lack of Transportation (Non-Medical): No  Physical Activity: Not on file  Stress: Not on file  Social Connections: Not on file  Intimate Partner Violence: Not At Risk (02/16/2023)   Humiliation, Afraid, Rape, and Kick questionnaire    Fear of Current or Ex-Partner: No    Emotionally Abused: No    Physically Abused: No    Sexually Abused: No     Allergies  Allergen Reactions   Codeine Nausea And Vomiting, Other  (See Comments) and Nausea Only   Metformin Other (See Comments)    unsure     CBC    Component Value Date/Time   WBC 12.0 (H) 02/19/2023 1405   WBC 15.2 (H) 02/18/2023 0421   RBC 2.84 (L) 02/19/2023 1405   RBC 2.64 (L) 02/18/2023 0421   HGB 8.6 (L) 02/19/2023 1405   HCT 26.2 (L) 02/19/2023 1405   PLT 363 02/19/2023 1405   MCV 92 02/19/2023 1405   MCH 30.3 02/19/2023 1405   MCH 30.3 02/18/2023 0421   MCHC 32.8 02/19/2023 1405   MCHC 34.0 02/18/2023 0421   RDW 14.3 02/19/2023 1405   LYMPHSABS 3.6 (H) 02/19/2023 1405   MONOABS 0.9 02/16/2023 1759   EOSABS 0.2 02/19/2023 1405   BASOSABS 0.1 02/19/2023 1405    Pulmonary Functions Testing Results:     No data to display          Outpatient Medications Prior to Visit  Medication Sig Dispense Refill   albuterol (VENTOLIN HFA) 108 (90 Base) MCG/ACT inhaler Inhale 2 puffs into the lungs every 6 (six) hours as needed for wheezing or shortness of breath.     ALPRAZolam (XANAX) 1 MG tablet Take 1 mg by mouth 3 (three) times daily as needed for anxiety.     ARIPiprazole (ABILIFY) 10 MG tablet Take 1 tablet (10 mg total) by mouth daily. 30 tablet 0   aspirin 81 MG EC tablet Take 81 mg by mouth daily.     B Complex-C (B-COMPLEX WITH VITAMIN C) tablet Take 1 tablet by mouth  daily.     busPIRone (BUSPAR) 30 MG tablet Take 1 tablet (30 mg total) by mouth 2 (two) times daily. 60 tablet 3   cariprazine (VRAYLAR) 3 MG capsule Take 1 capsule (3 mg total) by mouth daily. 30 capsule 0   Cholecalciferol (VITAMIN D3) 50 MCG (2000 UT) capsule Take 2,000 Units by mouth daily.     divalproex (DEPAKOTE) 250 MG DR tablet Take 250 mg by mouth 2 (two) times daily.     fluticasone (FLONASE) 50 MCG/ACT nasal spray Place 2 sprays into both nostrils daily. sometimes uses in the evening as needed     glucose blood ([employer] BLOOD GLUCOSE TEST) test strip Use to test glucose once daily     HYDROcodone-acetaminophen (NORCO/VICODIN) 5-325 MG tablet Take 1 tablet  by mouth every 6 (six) hours as needed for moderate pain (pain score 4-6). 15 tablet 0   ibuprofen (ADVIL) 800 MG tablet Take 1 tablet (800 mg total) by mouth every 8 (eight) hours as needed. 30 tablet 0   metoprolol succinate (TOPROL-XL) 25 MG 24 hr tablet Take 25 mg by mouth daily.     omeprazole (PRILOSEC) 40 MG capsule Take 40 mg by mouth daily.     paliperidone (INVEGA) 6 MG 24 hr tablet Take 6 mg by mouth every morning.     prazosin (MINIPRESS) 5 MG capsule Take 5 mg by mouth at bedtime.     promethazine (PHENERGAN) 25 MG tablet Take 25 mg by mouth every 8 (eight) hours as needed for vomiting or nausea.     rosuvastatin (CRESTOR) 20 MG tablet Take 1 tablet (20 mg total) by mouth daily. 90 tablet 1   sucralfate (CARAFATE) 1 g tablet Take 1 g by mouth 4 (four) times daily -  with meals and at bedtime.     Tiotropium Bromide-Olodaterol (STIOLTO RESPIMAT) 2.5-2.5 MCG/ACT AERS Inhale 2 puffs into the lungs daily. 60 each 12   traZODone (DESYREL) 100 MG tablet Take 300 mg by mouth at bedtime.     VIIBRYD 40 MG TABS Take 40 mg by mouth daily.     zolpidem (AMBIEN) 10 MG tablet Take 10 mg by mouth at bedtime.     No facility-administered medications prior to visit.

## 2023-02-24 ENCOUNTER — Telehealth: Payer: Self-pay

## 2023-02-24 NOTE — Progress Notes (Unsigned)
[facility] POST-OP OFFICE VISIT  02/25/2023  HPI: Victor Alvarez is a 63 y.o. male had surgery on  January 8, and 14th, 2025, now s/p hemorrhoidectomy with pathology below to be reviewed.  We discussed this in detail including the plan for follow-up and treatment options.  As for now we will give him further opportunity to progress in his healing.  He denies any bleeding, reports his pain is a 3.  Reports he is moving his bowels on a significant 3-4 times daily basis.  He is utilizing sitz bath's to help with cleansing and with pain.  Denies any fevers and chills.  1. 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    Vital signs: BP (!) 143/73   Pulse 91   Temp 97.8 F (36.6 C) (Oral)   Ht 5\' 10"  (1.778 m)   Wt 229 lb 3.2 oz (104 kg)   SpO2 96%   BMI 32.89 kg/m    Physical Exam: Constitutional: He appears stable, alert, no distress.   Assessment/Plan: This is a 63 y.o. male post procedure, and f/u control of hemorrhage January 8, and 14th, 2025, now s/p hemorrhoidectomy with pathology:   1. 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    Patient Active Problem List   Diagnosis Date Noted   PTSD (post-traumatic stress disorder) 02/19/2023   Anemia 02/19/2023   Obesity (BMI 30-39.9) 02/19/2023   Hemorrhagic shock (HCC) 02/16/2023   GI bleed 02/16/2023   Rectal bleeding 02/16/2023   Anal condylomata 02/10/2023   Sessile rectal polyp 02/10/2023   Grade II hemorrhoids 01/20/2023   Polyp of anal verge 01/20/2023   Encounter for screening colonoscopy 01/18/2023   Adenomatous polyp of colon 01/18/2023   Depression, recurrent (HCC) 01/09/2023   Smoking 12/11/2022   Polypharmacy 12/11/2022   Closed fracture of left wrist 06/03/2022   Acute prostatitis 04/21/2022   Tremor  04/06/2022   Erythrocytosis 02/03/2022   Syncope 08/16/2021   Bilateral tinnitus 08/08/2021   Bereavement 08/08/2021   Vertigo 08/08/2021   Low back pain 07/04/2021   Spasm of back muscles 07/04/2021   History of alcoholism (HCC) 04/30/2021   Gastritis 02/06/2021   Chronic obstructive pulmonary disease (HCC) 09/24/2020   Insomnia 09/24/2020   Anxiety 09/24/2020   Allergic rhinitis 09/24/2020   Gastroesophageal reflux disease 09/24/2020   Lethargy 09/24/2020   Hypercholesterolemia 09/24/2020   Nausea 09/24/2020   Obstructive sleep apnea syndrome 09/24/2020   Snoring 09/24/2020   Type 2 diabetes mellitus (HCC) 09/24/2020   Vitamin D deficiency 09/24/2020   Depressive disorder 09/24/2020   Panic attack 09/24/2020   History of appendectomy 08/07/2019   Mixed hyperlipidemia 01/18/2017   Type 2 diabetes mellitus without complication, without long-term current use of insulin (HCC) 01/18/2017   Cubital tunnel syndrome on right 12/11/2016   Obesity 06/26/2016   Primary osteoarthritis of first carpometacarpal joint of right hand 04/29/2016   Essential tremor 12/18/2015   Corneal scar, right eye 10/24/2015   Status post cataract extraction and insertion of intraocular lens of right eye 10/24/2015   Nuclear sclerosis, right 10/23/2015   Fracture of scaphoid bone of wrist 03/25/2015   Ulnar abutment syndrome of right wrist 03/25/2015   Bipolar affective disorder, currently depressed, moderate (HCC) 07/20/2014  Tobacco use 06/29/2014   Cortical senile cataract of right eye 06/15/2014   Recurrent bronchospasm 06/15/2014   Posttraumatic stress disorder 01/20/2014   -Will have him follow-up in 2 months, or as needed for clinical examination, and further discussion of treatments as noted below.    - Treatment Options Discussed and to be reviewed in next visit:  Condylomata (Genital Warts): Topical Treatments: Imiquimod, podophyllotoxin, or sinecatechins. Cryotherapy: Freezing the warts  with liquid nitrogen. Surgical Removal: Excision, laser therapy, or electrocautery.  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 Surgical Options: High-resolution anoscopy (HRA) guided excision or ablation. Laser Therapy: CO2 laser ablation. Immunotherapy: HPV vaccines may help in preventing recurrence.  Hemorrhoids: Lifestyle Changes: High-fiber diet, increased water intake, and regular exercise. Topical Treatments: Over-the-counter creams and ointments. Minimally Invasive Procedures: Rubber band ligation, sclerotherapy, or infrared coagulation. Surgery: Hemorrhoidectomy or stapled hemorrhoidopexy for severe cases.  Surveillance Regular Follow-ups: Periodic high-resolution anoscopy (HRA) to monitor HSIL/AIN-2. HPV Testing: Regular HPV testing to check for high-risk HPV strains. Self-Examination: Regular self-examination for any new or recurring lesions. Symptom Monitoring: Keep track of any symptoms like bleeding, itching, or pain and report them to your healthcare provider.   Campbell Lerner M.D., FACS 02/25/2023, 1:52 PM

## 2023-02-24 NOTE — Telephone Encounter (Signed)
Copied from CRM [id number]. Topic: General - Other >> Feb 24, 2023  9:55 AM Victor Alvarez wrote: Reason for CRM: The patient would like to be contacted when possible to review their labs from 1//17/25  Please contact further when possible

## 2023-02-25 ENCOUNTER — Ambulatory Visit: Payer: MEDICAID | Admitting: Surgery

## 2023-02-25 ENCOUNTER — Encounter: Payer: Self-pay | Admitting: Surgery

## 2023-02-25 ENCOUNTER — Encounter: Payer: Self-pay | Admitting: Family Medicine

## 2023-02-25 VITALS — BP 143/73 | HR 91 | Temp 97.8°F | Ht 70.0 in | Wt 229.2 lb

## 2023-02-25 DIAGNOSIS — R85613 High grade squamous intraepithelial lesion on cytologic smear of anus (HGSIL): Secondary | ICD-10-CM

## 2023-02-25 DIAGNOSIS — K641 Second degree hemorrhoids: Secondary | ICD-10-CM

## 2023-02-25 DIAGNOSIS — Z09 Encounter for follow-up examination after completed treatment for conditions other than malignant neoplasm: Secondary | ICD-10-CM

## 2023-02-25 NOTE — Patient Instructions (Signed)
 Surgical Procedures for Hemorrhoids, Care After After surgery for hemorrhoids, it is common to have: Pain in your rectum for 2-4 weeks after the procedure, and may take 1-2 months to fully recover. Pain when you are pooping. A small amount of bleeding from your rectum. This is more likely to happen the first time you poop after surgery. Follow these instructions at home: Medicines Take over-the-counter and prescription medicines only as told by your health care provider. If you were prescribed antibiotics, use them as told by your provider. Do not stop using the antibiotic even if you start to feel better. Ask your provider if the medicine prescribed to you requires you to avoid driving or using machinery. Use a stool softener or a medicine that helps you poop (laxative) as told by your provider. Eating and drinking Follow instructions from your provider about what you may eat and drink after the surgery. You may need to take these actions to prevent or treat constipation: Drink enough fluid to keep your pee (urine) pale yellow. Take over-the-counter or prescription medicines. Eat foods that are high in fiber, such as beans, whole grains, and fresh fruits and vegetables. Limit foods that are high in fat and processed sugars, such as fried or sweet foods. Managing pain and swelling  Take warm sitz baths for 15-20 minutes, 2-3 times a day to ease soreness and itching. This will also help keep the rectal area clean. If told, put ice on the affected area. It may help to use ice packs between sitz baths. Put ice in a plastic bag. Place a towel between your skin and the bag. Leave the ice on for 20 minutes, 2-3 times a day. If your skin turns bright red, remove the ice right away to prevent skin damage. The risk of damage is higher if you cannot feel pain, heat, or cold. Activity Rest as told by your provider. Do not sit for a long time without moving. Get up to take short walks every 1-2  hours. This will improve blood flow and breathing. Ask for help if you feel weak or unsteady. You may have to avoid lifting. Ask your provider how much you can safely lift. Return to your normal activities as told by your provider. Ask your provider what activities are safe for you. General instructions Do not strain to poop. Do not spend a long time sitting on the toilet. If you were given a sedative during the surgery, it can affect you for several hours. Do not drive or operate machinery until your provider says that it is safe. Your provider may give you more instructions. Make sure you know what you can and cannot do. Contact a health care provider if: Your pain medicine is not helping. You have a fever or chills. You have drainage that smells bad. You have a lot of swelling. You become constipated. You have trouble peeing (urinating). You have very bad pain in your rectum. Get help right away if: You are bleeding a lot from your rectum. This information is not intended to replace advice given to you by your health care provider. Make sure you discuss any questions you have with your health care provider. Document Revised: 09/12/2021 Document Reviewed: 09/12/2021 Elsevier Patient Education  2024 [employer].

## 2023-02-26 NOTE — Telephone Encounter (Signed)
Pt called back and would like to speak to PCP regarding his labs. Pt concerned that the Hgb A1C provided by PCP is incorrect. Pt is basing this from a home A1C. He needs his A1C to be <8 %. Pt doesn't understand and is worried.

## 2023-03-01 ENCOUNTER — Other Ambulatory Visit: Payer: Self-pay | Admitting: Pharmacist

## 2023-03-01 MED ORDER — ONETOUCH ULTRA TEST VI STRP: ORAL_STRIP | 12 refills | Status: AC

## 2023-03-01 MED ORDER — ONETOUCH ULTRA 2 W/DEVICE KIT: PACK | 0 refills | Status: AC

## 2023-03-01 MED ORDER — ACCU-CHEK SOFTCLIX LANCETS MISC: 12 refills | Status: AC

## 2023-03-01 NOTE — Telephone Encounter (Signed)
I spoke to patient. He wanted to know his A1C value and if it was good or bad. I advised him it was a 6.6 and shared Janna's result message about the A1c with patient. He verbalized understanding. We scheduled a follow up appointment on 03/11/23 per Janna's last note.   He has changed pharmacies and will be using [facility] moving forward. He will update at his visit next week.

## 2023-03-01 NOTE — Progress Notes (Signed)
   03/01/2023  Patient ID: [name], male   DOB: Mar 21, 1960, 63 y.o.   MRN: [id number]  Called and spoke with the patient on the phone today for a medication review.  Reports wanting to [facility] all of [facility] to [facility] as he was tired of dealing with [facility].  [employer] [phone]. They report having filled Prazosin 1mg  (1 capsule in PM x 1wk, then 2 capsules in PM), Escitalopram 20mg  daily, Vraylar 3mg , Trazodone 300mg .  He is seeing Dr. Georjean Mode at [facility], who stopped his Xanax due to recent memory concerns. Reports doing better since discontinuation. Left message to discuss psych meds he was to discontinue!  Appears to be missing Invega, Viibryd, Depakote, Zolpidem.  Adelina Mings the phone number to [facility]. Will follow-up regarding which medications are still missing. Advised to ask for [facility].   Also sent Rx for glucose testing supplies per patient request.   Marlowe Aschoff, PharmD [facility] Phone Number: [phone]

## 2023-03-02 NOTE — Addendum Note (Signed)
Addended by: Pollie Friar on: 03/02/2023 03:58 PM   Modules accepted: Orders

## 2023-03-03 ENCOUNTER — Telehealth: Payer: Self-pay | Admitting: Pharmacist

## 2023-03-03 DIAGNOSIS — E782 Mixed hyperlipidemia: Secondary | ICD-10-CM

## 2023-03-03 DIAGNOSIS — R0602 Shortness of breath: Secondary | ICD-10-CM

## 2023-03-03 DIAGNOSIS — J449 Chronic obstructive pulmonary disease, unspecified: Secondary | ICD-10-CM

## 2023-03-03 DIAGNOSIS — F419 Anxiety disorder, unspecified: Secondary | ICD-10-CM

## 2023-03-03 DIAGNOSIS — K219 Gastro-esophageal reflux disease without esophagitis: Secondary | ICD-10-CM

## 2023-03-03 MED ORDER — ROSUVASTATIN CALCIUM 20 MG PO TABS: 20.0000 mg | ORAL_TABLET | Freq: Every day | ORAL | 1 refills | Status: DC

## 2023-03-03 MED ORDER — METOPROLOL SUCCINATE ER 25 MG PO TB24: 25.0000 mg | ORAL_TABLET | Freq: Every day | ORAL | 5 refills | Status: AC

## 2023-03-03 MED ORDER — OMEPRAZOLE 40 MG PO CPDR: 40.0000 mg | DELAYED_RELEASE_CAPSULE | Freq: Every day | ORAL | 5 refills | Status: DC

## 2023-03-03 MED ORDER — STIOLTO RESPIMAT 2.5-2.5 MCG/ACT IN AERS: 2.0000 | INHALATION_SPRAY | Freq: Every day | RESPIRATORY_TRACT | 5 refills | Status: AC

## 2023-03-03 MED ORDER — ALBUTEROL SULFATE HFA 108 (90 BASE) MCG/ACT IN AERS: 2.0000 | INHALATION_SPRAY | Freq: Four times a day (QID) | RESPIRATORY_TRACT | 5 refills | Status: AC | PRN

## 2023-03-03 MED ORDER — FLUTICASONE PROPIONATE 50 MCG/ACT NA SUSP: 2.0000 | Freq: Every day | NASAL | 5 refills | Status: AC

## 2023-03-03 MED ORDER — BUSPIRONE HCL 30 MG PO TABS: 30.0000 mg | ORAL_TABLET | Freq: Two times a day (BID) | ORAL | 5 refills | Status: DC

## 2023-03-03 NOTE — Progress Notes (Signed)
   03/03/2023  Patient ID: Victor Alvarez, male   DOB: 21-Mar-1960, 63 y.o.   MRN: [id number]  [facility] and [facility] NOT [facility] to [facility]. Advised to cancel his profile as we will send renewed prescriptions to [place].   Refills of medications sent to [facility] to avoid further confusion.   Marlowe Aschoff, PharmD [facility] Phone Number: [phone]

## 2023-03-04 ENCOUNTER — Other Ambulatory Visit: Payer: Self-pay | Admitting: Physician Assistant

## 2023-03-05 NOTE — Telephone Encounter (Signed)
Requested medication (s) are due for refill today -yes  Requested medication (s) are on the active medication list -yes  Future visit scheduled -yes  Last refill: 02/09/23 #30  Notes to clinic: non delegated Rx- requesting 90 day supply  Requested Prescriptions  Pending Prescriptions Disp Refills   ARIPiprazole (ABILIFY) 10 MG tablet [Pharmacy Med Name: ARIPIPRAZOLE 10 MG TABLET] 90 tablet 1    Sig: TAKE 1 TABLET BY MOUTH EVERY DAY     Not Delegated - Psychiatry:  Antipsychotics - Second Generation (Atypical) - aripiprazole Failed - 03/05/2023  8:55 AM      Failed - This refill cannot be delegated      Failed - Last BP in normal range    BP Readings from Last 1 Encounters:  02/25/23 (!) 143/73         Failed - Lipid Panel in normal range within the last 12 months    Cholesterol, Total  Date Value Ref Range Status  12/10/2022 173 100 - 199 mg/dL Final   LDL Chol Calc (NIH)  Date Value Ref Range Status  12/10/2022 114 (H) 0 - 99 mg/dL Final   HDL  Date Value Ref Range Status  12/10/2022 37 (L) >39 mg/dL Final   Triglycerides  Date Value Ref Range Status  12/10/2022 124 0 - 149 mg/dL Final         Failed - CBC within normal limits and completed in the last 12 months    WBC  Date Value Ref Range Status  02/19/2023 12.0 (H) 3.4 - 10.8 x10E3/uL Final  02/18/2023 15.2 (H) 4.0 - 10.5 K/uL Final   RBC  Date Value Ref Range Status  02/19/2023 2.84 (L) 4.14 - 5.80 x10E6/uL Final  02/18/2023 2.64 (L) 4.22 - 5.81 MIL/uL Final   Hemoglobin  Date Value Ref Range Status  02/19/2023 8.6 (L) 13.0 - 17.7 g/dL Final   Hematocrit  Date Value Ref Range Status  02/19/2023 26.2 (L) 37.5 - 51.0 % Final   MCHC  Date Value Ref Range Status  02/19/2023 32.8 31.5 - 35.7 g/dL Final  40/98/1191 47.8 30.0 - 36.0 g/dL Final   [facility]  Date Value Ref Range Status  02/19/2023 30.3 26.6 - 33.0 pg Final  02/18/2023 30.3 26.0 - 34.0 pg Final   MCV  Date Value Ref Range Status  02/19/2023  92 79 - 97 fL Final   No results found for: "PLTCOUNTKUC", "LABPLAT", "POCPLA" RDW  Date Value Ref Range Status  02/19/2023 14.3 11.6 - 15.4 % Final         Passed - TSH in normal range and within 360 days    TSH  Date Value Ref Range Status  12/10/2022 2.460 0.450 - 4.500 uIU/mL Final         Passed - Completed PHQ-2 or PHQ-9 in the last 360 days      Passed - Last Heart Rate in normal range    Pulse Readings from Last 1 Encounters:  02/25/23 91         Passed - Valid encounter within last 6 months    Recent Outpatient Visits           2 weeks ago Type 2 diabetes mellitus without complication, without long-term current use of insulin (HCC)   [place] [facility] [place], [place], PA-C   1 month ago Mixed hyperlipidemia   [place] [facility], Angie, PA-C   2 months ago Encounter for medical examination to establish care  [place] [facility] [place], Stella, PA-C       Future Appointments             In 6 days Debera Lat, PA-C [place] [facility], [facility]   In 3 weeks End, Cristal Deer, MD [place] [facility] [place]   In 1 month Raechel Chute, MD [facility] at [facility] - CMP within normal limits and completed in the last 12 months    Albumin  Date Value Ref Range Status  02/17/2023 2.6 (L) 3.5 - 5.0 g/dL Final  16/11/9602 4.6 3.9 - 4.9 g/dL Final   Alkaline Phosphatase  Date Value Ref Range Status  02/16/2023 55 38 - 126 U/L Final   ALT  Date Value Ref Range Status  02/16/2023 11 0 - 44 U/L Final   AST  Date Value Ref Range Status  02/16/2023 11 (L) 15 - 41 U/L Final   BUN  Date Value Ref Range Status  02/19/2023 11 8 - 27 mg/dL Final   Calcium  Date Value Ref Range Status  02/19/2023 9.0 8.6 - 10.2 mg/dL Final   Calcium, Ion  Date Value Ref Range Status  02/16/2023 1.24 1.15 - 1.40 mmol/L Final   CO2  Date  Value Ref Range Status  02/19/2023 24 20 - 29 mmol/L Final   TCO2  Date Value Ref Range Status  02/16/2023 24 22 - 32 mmol/L Final   Creatinine, Ser  Date Value Ref Range Status  02/19/2023 1.03 0.76 - 1.27 mg/dL Final   Glucose  Date Value Ref Range Status  02/19/2023 132 (H) 70 - 99 mg/dL Final   Glucose, Bld  Date Value Ref Range Status  02/17/2023 206 (H) 70 - 99 mg/dL Final    Comment:    Glucose reference range applies only to samples taken after fasting for at least 8 hours.   Glucose-Capillary  Date Value Ref Range Status  02/18/2023 133 (H) 70 - 99 mg/dL Final    Comment:    Glucose reference range applies only to samples taken after fasting for at least 8 hours.   Potassium  Date Value Ref Range Status  02/19/2023 3.8 3.5 - 5.2 mmol/L Final   Sodium  Date Value Ref Range Status  02/19/2023 142 134 - 144 mmol/L Final   Total Bilirubin  Date Value Ref Range Status  02/16/2023 0.6 0.0 - 1.2 mg/dL Final   Bilirubin Total  Date Value Ref Range Status  12/10/2022 0.5 0.0 - 1.2 mg/dL Final   Protein, ur  Date Value Ref Range Status  08/24/2019 NEGATIVE NEGATIVE mg/dL Final   Total Protein  Date Value Ref Range Status  02/16/2023 5.5 (L) 6.5 - 8.1 g/dL Final  54/10/8117 6.8 6.0 - 8.5 g/dL Final   GFR calc Af Amer  Date Value Ref Range Status  08/24/2019 >60 >60 mL/min Final   eGFR  Date Value Ref Range Status  02/19/2023 81 >59 mL/min/1.73 Final   GFR, Estimated  Date Value Ref Range Status  02/17/2023 >60 >60 mL/min Final    Comment:    (NOTE) Calculated using the CKD-EPI Creatinine Equation (2021)             Requested Prescriptions  Pending Prescriptions Disp Refills   ARIPiprazole (ABILIFY) 10 MG tablet [Pharmacy Med Name: ARIPIPRAZOLE 10 MG TABLET] 90 tablet 1    Sig: TAKE 1 TABLET BY MOUTH EVERY DAY  Not Delegated - Psychiatry:  Antipsychotics - Second Generation (Atypical) - aripiprazole Failed - 03/05/2023  8:55 AM       Failed - This refill cannot be delegated      Failed - Last BP in normal range    BP Readings from Last 1 Encounters:  02/25/23 (!) 143/73         Failed - Lipid Panel in normal range within the last 12 months    Cholesterol, Total  Date Value Ref Range Status  12/10/2022 173 100 - 199 mg/dL Final   LDL Chol Calc (NIH)  Date Value Ref Range Status  12/10/2022 114 (H) 0 - 99 mg/dL Final   HDL  Date Value Ref Range Status  12/10/2022 37 (L) >39 mg/dL Final   Triglycerides  Date Value Ref Range Status  12/10/2022 124 0 - 149 mg/dL Final         Failed - CBC within normal limits and completed in the last 12 months    WBC  Date Value Ref Range Status  02/19/2023 12.0 (H) 3.4 - 10.8 x10E3/uL Final  02/18/2023 15.2 (H) 4.0 - 10.5 K/uL Final   RBC  Date Value Ref Range Status  02/19/2023 2.84 (L) 4.14 - 5.80 x10E6/uL Final  02/18/2023 2.64 (L) 4.22 - 5.81 MIL/uL Final   Hemoglobin  Date Value Ref Range Status  02/19/2023 8.6 (L) 13.0 - 17.7 g/dL Final   Hematocrit  Date Value Ref Range Status  02/19/2023 26.2 (L) 37.5 - 51.0 % Final   MCHC  Date Value Ref Range Status  02/19/2023 32.8 31.5 - 35.7 g/dL Final  56/21/3086 57.8 30.0 - 36.0 g/dL Final   [facility]  Date Value Ref Range Status  02/19/2023 30.3 26.6 - 33.0 pg Final  02/18/2023 30.3 26.0 - 34.0 pg Final   MCV  Date Value Ref Range Status  02/19/2023 92 79 - 97 fL Final   No results found for: "PLTCOUNTKUC", "LABPLAT", "POCPLA" RDW  Date Value Ref Range Status  02/19/2023 14.3 11.6 - 15.4 % Final         Passed - TSH in normal range and within 360 days    TSH  Date Value Ref Range Status  12/10/2022 2.460 0.450 - 4.500 uIU/mL Final         Passed - Completed PHQ-2 or PHQ-9 in the last 360 days      Passed - Last Heart Rate in normal range    Pulse Readings from Last 1 Encounters:  02/25/23 91         Passed - Valid encounter within last 6 months    Recent Outpatient Visits           2 weeks  ago Type 2 diabetes mellitus without complication, without long-term current use of insulin (HCC)   [place] [facility] [place], [place], PA-C   1 month ago Mixed hyperlipidemia   [place] [place], [place], PA-C   2 months ago Encounter for medical examination to establish care   [place] [facility] [place], [place], PA-C       Future Appointments             In 6 days Debera Lat, PA-C [place] [facility], [facility]   In 3 weeks End, Cristal Deer, MD [place] [facility] [place]   In 1 month Raechel Chute, MD [facility] at [facility]  Passed - CMP within normal limits and completed in the last 12 months    Albumin  Date Value Ref Range Status  02/17/2023 2.6 (L) 3.5 - 5.0 g/dL Final  91/47/8295 4.6 3.9 - 4.9 g/dL Final   Alkaline Phosphatase  Date Value Ref Range Status  02/16/2023 55 38 - 126 U/L Final   ALT  Date Value Ref Range Status  02/16/2023 11 0 - 44 U/L Final   AST  Date Value Ref Range Status  02/16/2023 11 (L) 15 - 41 U/L Final   BUN  Date Value Ref Range Status  02/19/2023 11 8 - 27 mg/dL Final   Calcium  Date Value Ref Range Status  02/19/2023 9.0 8.6 - 10.2 mg/dL Final   Calcium, Ion  Date Value Ref Range Status  02/16/2023 1.24 1.15 - 1.40 mmol/L Final   CO2  Date Value Ref Range Status  02/19/2023 24 20 - 29 mmol/L Final   TCO2  Date Value Ref Range Status  02/16/2023 24 22 - 32 mmol/L Final   Creatinine, Ser  Date Value Ref Range Status  02/19/2023 1.03 0.76 - 1.27 mg/dL Final   Glucose  Date Value Ref Range Status  02/19/2023 132 (H) 70 - 99 mg/dL Final   Glucose, Bld  Date Value Ref Range Status  02/17/2023 206 (H) 70 - 99 mg/dL Final    Comment:    Glucose reference range applies only to samples taken after fasting for at least 8 hours.   Glucose-Capillary  Date Value Ref Range Status  02/18/2023 133 (H)  70 - 99 mg/dL Final    Comment:    Glucose reference range applies only to samples taken after fasting for at least 8 hours.   Potassium  Date Value Ref Range Status  02/19/2023 3.8 3.5 - 5.2 mmol/L Final   Sodium  Date Value Ref Range Status  02/19/2023 142 134 - 144 mmol/L Final   Total Bilirubin  Date Value Ref Range Status  02/16/2023 0.6 0.0 - 1.2 mg/dL Final   Bilirubin Total  Date Value Ref Range Status  12/10/2022 0.5 0.0 - 1.2 mg/dL Final   Protein, ur  Date Value Ref Range Status  08/24/2019 NEGATIVE NEGATIVE mg/dL Final   Total Protein  Date Value Ref Range Status  02/16/2023 5.5 (L) 6.5 - 8.1 g/dL Final  62/13/0865 6.8 6.0 - 8.5 g/dL Final   GFR calc Af Amer  Date Value Ref Range Status  08/24/2019 >60 >60 mL/min Final   eGFR  Date Value Ref Range Status  02/19/2023 81 >59 mL/min/1.73 Final   GFR, Estimated  Date Value Ref Range Status  02/17/2023 >60 >60 mL/min Final    Comment:    (NOTE) Calculated using the CKD-EPI Creatinine Equation (2021)

## 2023-03-08 NOTE — Telephone Encounter (Signed)
Please, let pt know that he needs to check with his current [facility]/Dr. Georjean Mode for refill of antipsychotics. She prescribed him recently vraylar.

## 2023-03-10 ENCOUNTER — Encounter: Payer: Self-pay | Admitting: Gastroenterology

## 2023-03-11 ENCOUNTER — Ambulatory Visit (INDEPENDENT_AMBULATORY_CARE_PROVIDER_SITE_OTHER): Payer: MEDICAID | Admitting: Physician Assistant

## 2023-03-11 ENCOUNTER — Encounter: Payer: Self-pay | Admitting: Physician Assistant

## 2023-03-11 VITALS — BP 121/69 | HR 84 | Resp 18 | Ht 70.0 in | Wt 231.0 lb

## 2023-03-11 DIAGNOSIS — F17200 Nicotine dependence, unspecified, uncomplicated: Secondary | ICD-10-CM | POA: Diagnosis not present

## 2023-03-11 DIAGNOSIS — Z6833 Body mass index (BMI) 33.0-33.9, adult: Secondary | ICD-10-CM

## 2023-03-11 DIAGNOSIS — K219 Gastro-esophageal reflux disease without esophagitis: Secondary | ICD-10-CM | POA: Diagnosis not present

## 2023-03-11 DIAGNOSIS — E782 Mixed hyperlipidemia: Secondary | ICD-10-CM | POA: Diagnosis not present

## 2023-03-11 DIAGNOSIS — J42 Unspecified chronic bronchitis: Secondary | ICD-10-CM

## 2023-03-11 DIAGNOSIS — Z79899 Other long term (current) drug therapy: Secondary | ICD-10-CM

## 2023-03-11 DIAGNOSIS — E669 Obesity, unspecified: Secondary | ICD-10-CM

## 2023-03-11 NOTE — Progress Notes (Signed)
 Established patient visit  Patient: Victor Alvarez   DOB: May 25, 1960   63 y.o. Male  MRN: [id number] Visit Date: 03/11/2023  Today's healthcare provider: Jolynn Spencer, PA-C   Chief Complaint  Patient presents with   Medical Management of Chronic Issues   Subjective       Discussed the use of AI scribe software for clinical note transcription with the patient, who gave verbal consent to proceed.  History of Present Illness   The patient, with a history of bleeding, COPD, and acid reflux, presents for a follow-up visit. He reports that his bleeding has resolved and he is healing up every day. He also reports shortness of breath with activity, which is attributed to his COPD. He has reduced his smoking to about 10 cigarettes a day. He also reports that he has acid reflux, but is managing it by avoiding eating late at night and limiting his caffeine intake. He has recently transitioned to a new behavioral health provider and has had some changes in his medication regimen. He is currently taking Abilify , aspirin , vitamin C, OneTouch, Lexapro , and prazosin . He reports that he has noticed an improvement in his memory since stopping Xanax . He is considering joining a gym to start an exercise regimen and is planning on starting a healthy diet to help with weight loss.           03/11/2023    8:56 AM 02/19/2023    1:10 PM 01/08/2023    9:49 AM  Depression screen PHQ 2/9  Decreased Interest 1 2 1   Down, Depressed, Hopeless 1 2 1   PHQ - 2 Score 2 4 2   Altered sleeping 3 2 1   Tired, decreased energy 3 2 2   Change in appetite 1 2 2   Feeling bad or failure about yourself  1 3 3   Trouble concentrating 1 2 1   Moving slowly or fidgety/restless 0 1 2  Suicidal thoughts 0 0 1  PHQ-9 Score 11 16 14   Difficult doing work/chores Somewhat difficult Somewhat difficult Somewhat difficult      03/11/2023    8:56 AM 02/19/2023    1:10 PM 01/08/2023    9:49 AM 12/10/2022    1:56 PM  GAD 7 : Generalized  Anxiety Score  Nervous, Anxious, on Edge 2 3 2 3   Control/stop worrying 2 2 2 2   Worry too much - different things 2 3 2 2   Trouble relaxing 2 3 2 2   Restless 1 2 2 3   Easily annoyed or irritable 1 2 1  0  Afraid - awful might happen 3 3 3 2   Total GAD 7 Score 13 18 14 14   Anxiety Difficulty Somewhat difficult Somewhat difficult Somewhat difficult Somewhat difficult    Medications: Outpatient Medications Prior to Visit  Medication Sig   Accu-Chek Softclix Lancets lancets Use as instructed to check blood glucose daily. Dx E11.9   albuterol  (VENTOLIN  HFA) 108 (90 Base) MCG/ACT inhaler Inhale 2 puffs into the lungs every 6 (six) hours as needed for wheezing or shortness of breath.   ARIPiprazole  (ABILIFY ) 10 MG tablet Take 1 tablet (10 mg total) by mouth daily.   aspirin  81 MG EC tablet Take 81 mg by mouth daily.   B Complex-C (B-COMPLEX WITH VITAMIN C) tablet Take 1 tablet by mouth daily.   Blood Glucose Monitoring Suppl (ONE TOUCH ULTRA 2) w/Device KIT Use to check blood sugar daily. Dx E11.9   cariprazine  (VRAYLAR ) 3 MG capsule Take 1 capsule (3 mg total) by mouth daily.  Cholecalciferol (VITAMIN D3) 50 MCG (2000 UT) capsule Take 2,000 Units by mouth daily.   escitalopram  (LEXAPRO ) 20 MG tablet Take 20 mg by mouth daily.   fluticasone  (FLONASE ) 50 MCG/ACT nasal spray Place 2 sprays into both nostrils daily. sometimes uses in the evening as needed   glucose blood (ONETOUCH ULTRA TEST) test strip Use as instructed to check blood glucose daily. Dx E11.9   metoprolol  succinate (TOPROL -XL) 25 MG 24 hr tablet Take 1 tablet (25 mg total) by mouth daily.   omeprazole  (PRILOSEC) 40 MG capsule Take 1 capsule (40 mg total) by mouth daily.   prazosin  (MINIPRESS ) 1 MG capsule Take 2 mg by mouth at bedtime.   rosuvastatin  (CRESTOR ) 20 MG tablet Take 1 tablet (20 mg total) by mouth daily.   Tiotropium Bromide-Olodaterol (STIOLTO RESPIMAT ) 2.5-2.5 MCG/ACT AERS Inhale 2 puffs into the lungs daily.    trazodone  (DESYREL ) 300 MG tablet Take 300 mg by mouth at bedtime.   ibuprofen  (ADVIL ) 800 MG tablet Take 1 tablet (800 mg total) by mouth every 8 (eight) hours as needed. (Patient not taking: Reported on 03/11/2023)   [DISCONTINUED] busPIRone  (BUSPAR ) 30 MG tablet Take 1 tablet (30 mg total) by mouth 2 (two) times daily.   No facility-administered medications prior to visit.    Review of Systems All negative Except see HPI       Objective    BP 121/69   Pulse 84   Resp 18   Ht 5' 10 (1.778 m)   Wt 231 lb (104.8 kg)   SpO2 100%   BMI 33.15 kg/m     Physical Exam Vitals reviewed.  Constitutional:      General: He is not in acute distress.    Appearance: Normal appearance. He is not diaphoretic.  HENT:     Head: Normocephalic and atraumatic.  Eyes:     General: No scleral icterus.    Conjunctiva/sclera: Conjunctivae normal.  Cardiovascular:     Rate and Rhythm: Normal rate and regular rhythm.     Pulses: Normal pulses.     Heart sounds: Normal heart sounds. No murmur heard. Pulmonary:     Effort: Pulmonary effort is normal. No respiratory distress.     Breath sounds: Normal breath sounds. No wheezing or rhonchi.  Musculoskeletal:     Cervical back: Neck supple.     Right lower leg: No edema.     Left lower leg: No edema.  Lymphadenopathy:     Cervical: No cervical adenopathy.  Skin:    General: Skin is warm and dry.     Findings: No rash.  Neurological:     Mental Status: He is alert and oriented to person, place, and time. Mental status is at baseline.  Psychiatric:        Mood and Affect: Mood normal.        Behavior: Behavior normal.      No results found for any visits on 03/11/23.      Assessment and Plan    Medication Management Recent changes in medication regimen by behavioral health provider. Currently on Abilify , aspirin , vitamin C, OneTouch, Lexapro , and prazosin . Patient has transitioned to a new pharmacy. -Encouraged patient to maintain an  updated medication list and bring to all appointments. -Advised patient to communicate with the pharmacy if there are issues with receiving medications. -Plan to obtain release form to receive documentation from behavioral health provider to understand rationale for medication changes.  COPD chronic Reports shortness of breath with activity.  No changes in symptoms. -Continue current inhaler regimen. -Encouraged patient to reduce smoking with a goal of cessation. Was seen by pulmonology a month ago  Smoking 10 cigarettes daily Loves smoking Smoking cessation was addressed  Patient was advised to quit smoking  Patient seems willing. Will reassess at the next appt  GERD chronic No current symptoms reported. -Advised patient to avoid eating 3-4 hours before bed and to limit caffeine intake.  Obesity Weight Management Patient acknowledges need for weight loss. -Encouraged healthy eating habits, including increased intake of vegetables, lean proteins, and healthy fats. -Advised patient to consider joining a fitness center for regular exercise. -Plan to consider referral to a nutritionist for weight loss if needed in the future.  Follow-up Plan to see patient in 6 weeks. At next visit, patient to fast prior to appointment for blood work, including lipid panel.      No orders of the defined types were placed in this encounter.   Return in about 6 weeks (around 04/22/2023) for chronic disease f/u.   The patient was advised to call back or seek an in-person evaluation if the symptoms worsen or if the condition fails to improve as anticipated.  I discussed the assessment and treatment plan with the patient. The patient was provided an opportunity to ask questions and all were answered. The patient agreed with the plan and demonstrated an understanding of the instructions.  I, Eryk Beavers, PA-C have reviewed all documentation for this visit. The documentation on 03/11/2023  for the  exam, diagnosis, procedures, and orders are all accurate and complete.  Jolynn Spencer, [facility], MMS [facility] [phone] (phone) [phone] (fax)  [facility]

## 2023-03-12 ENCOUNTER — Telehealth: Payer: Self-pay

## 2023-03-12 DIAGNOSIS — Z860100 Personal history of colon polyps, unspecified: Secondary | ICD-10-CM

## 2023-03-12 NOTE — Telephone Encounter (Signed)
-----   Message from Luke Salaam sent at 03/10/2023 10:44 AM EST ----- Repeat colonoscopy in 1 year due to numerous polyps- refer for genetic testing if he agrees

## 2023-03-12 NOTE — Telephone Encounter (Signed)
 Called patient to let him know that we will reach out to him in a year by sending him a letter to remind him that he is due for a repeat colonoscopy. Patient agreed. I also let him know that I would be sending a referral to genetic testing to find out why he had so many polyps and patient agreed.

## 2023-03-24 ENCOUNTER — Other Ambulatory Visit: Payer: Self-pay | Admitting: Physician Assistant

## 2023-03-24 DIAGNOSIS — J302 Other seasonal allergic rhinitis: Secondary | ICD-10-CM

## 2023-03-24 NOTE — Telephone Encounter (Signed)
 Requested medication (s) are due for refill today: yes  Requested medication (s) are on the active medication list: yes  Last refill:  02/09/23 #30/0  Future visit scheduled: yes  Notes to clinic:  Unable to refill per protocol, cannot delegate. Cetrizine is historical med     Requested Prescriptions  Pending Prescriptions Disp Refills   ARIPiprazole (ABILIFY) 10 MG tablet 30 tablet 0    Sig: Take 1 tablet (10 mg total) by mouth daily.     Not Delegated - Psychiatry:  Antipsychotics - Second Generation (Atypical) - aripiprazole Failed - 03/24/2023  5:36 PM      Failed - This refill cannot be delegated      Failed - Lipid Panel in normal range within the last 12 months    Cholesterol, Total  Date Value Ref Range Status  12/10/2022 173 100 - 199 mg/dL Final   LDL Chol Calc (NIH)  Date Value Ref Range Status  12/10/2022 114 (H) 0 - 99 mg/dL Final   HDL  Date Value Ref Range Status  12/10/2022 37 (L) >39 mg/dL Final   Triglycerides  Date Value Ref Range Status  12/10/2022 124 0 - 149 mg/dL Final         Passed - TSH in normal range and within 360 days    TSH  Date Value Ref Range Status  12/10/2022 2.460 0.450 - 4.500 uIU/mL Final         Passed - Completed PHQ-2 or PHQ-9 in the last 360 days      Passed - Last BP in normal range    BP Readings from Last 1 Encounters:  03/11/23 121/69         Passed - Last Heart Rate in normal range    Pulse Readings from Last 1 Encounters:  03/11/23 84         Passed - Valid encounter within last 6 months    Recent Outpatient Visits           1 month ago Type 2 diabetes mellitus without complication, without long-term current use of insulin (HCC)   [facility] [place], [place], PA-C   2 months ago Mixed hyperlipidemia   [place] [facility] [place], [place], PA-C   3 months ago Encounter for medical examination to establish care   [facility] [place],  [place], PA-C       Future Appointments             In 5 days End, Cristal Deer, MD [place] [facility]   In 3 weeks Raechel Chute, MD [facility] at [place]   In 1 month Ost[place]alt, [place], PA-C [facility], [facility]            Passed - CBC within normal limits and completed in the last 12 months    WBC  Date Value Ref Range Status  02/19/2023 12.0 (H) 3.4 - 10.8 x10E3/uL Final  02/18/2023 15.2 (H) 4.0 - 10.5 K/uL Final   RBC  Date Value Ref Range Status  02/19/2023 2.84 (L) 4.14 - 5.80 x10E6/uL Final  02/18/2023 2.64 (L) 4.22 - 5.81 MIL/uL Final   Hemoglobin  Date Value Ref Range Status  02/19/2023 8.6 (L) 13.0 - 17.7 g/dL Final   Hematocrit  Date Value Ref Range Status  02/19/2023 26.2 (L) 37.5 - 51.0 % Final   MCHC  Date Value Ref Range Status  02/19/2023 32.8 31.5 - 35.7  g/dL Final  16/11/9602 54.0 30.0 - 36.0 g/dL Final   [facility]  Date Value Ref Range Status  02/19/2023 30.3 26.6 - 33.0 pg Final  02/18/2023 30.3 26.0 - 34.0 pg Final   MCV  Date Value Ref Range Status  02/19/2023 92 79 - 97 fL Final   No results found for: "PLTCOUNTKUC", "LABPLAT", "POCPLA" RDW  Date Value Ref Range Status  02/19/2023 14.3 11.6 - 15.4 % Final         Passed - CMP within normal limits and completed in the last 12 months    Albumin  Date Value Ref Range Status  02/17/2023 2.6 (L) 3.5 - 5.0 g/dL Final  98/12/9145 4.6 3.9 - 4.9 g/dL Final   Alkaline Phosphatase  Date Value Ref Range Status  02/16/2023 55 38 - 126 U/L Final   ALT  Date Value Ref Range Status  02/16/2023 11 0 - 44 U/L Final   AST  Date Value Ref Range Status  02/16/2023 11 (L) 15 - 41 U/L Final   BUN  Date Value Ref Range Status  02/19/2023 11 8 - 27 mg/dL Final   Calcium  Date Value Ref Range Status  02/19/2023 9.0 8.6 - 10.2 mg/dL Final   Calcium, Ion  Date Value Ref Range Status  02/16/2023 1.24 1.15 - 1.40 mmol/L Final   CO2   Date Value Ref Range Status  02/19/2023 24 20 - 29 mmol/L Final   TCO2  Date Value Ref Range Status  02/16/2023 24 22 - 32 mmol/L Final   Creatinine, Ser  Date Value Ref Range Status  02/19/2023 1.03 0.76 - 1.27 mg/dL Final   Glucose  Date Value Ref Range Status  02/19/2023 132 (H) 70 - 99 mg/dL Final   Glucose, Bld  Date Value Ref Range Status  02/17/2023 206 (H) 70 - 99 mg/dL Final    Comment:    Glucose reference range applies only to samples taken after fasting for at least 8 hours.   Glucose-Capillary  Date Value Ref Range Status  02/18/2023 133 (H) 70 - 99 mg/dL Final    Comment:    Glucose reference range applies only to samples taken after fasting for at least 8 hours.   Potassium  Date Value Ref Range Status  02/19/2023 3.8 3.5 - 5.2 mmol/L Final   Sodium  Date Value Ref Range Status  02/19/2023 142 134 - 144 mmol/L Final   Total Bilirubin  Date Value Ref Range Status  02/16/2023 0.6 0.0 - 1.2 mg/dL Final   Bilirubin Total  Date Value Ref Range Status  12/10/2022 0.5 0.0 - 1.2 mg/dL Final   Protein, ur  Date Value Ref Range Status  08/24/2019 NEGATIVE NEGATIVE mg/dL Final   Total Protein  Date Value Ref Range Status  02/16/2023 5.5 (L) 6.5 - 8.1 g/dL Final  82/95/6213 6.8 6.0 - 8.5 g/dL Final   GFR calc Af Amer  Date Value Ref Range Status  08/24/2019 >60 >60 mL/min Final   eGFR  Date Value Ref Range Status  02/19/2023 81 >59 mL/min/1.73 Final   GFR, Estimated  Date Value Ref Range Status  02/17/2023 >60 >60 mL/min Final    Comment:    (NOTE) Calculated using the CKD-EPI Creatinine Equation (2021)           cetirizine (ZYRTEC) 10 MG chewable tablet      Sig: Chew 1 tablet (10 mg total) by mouth daily.     Ear, Nose, and Throat:  Antihistamines 2  Passed - 03/24/2023  5:36 PM      Passed - Cr in normal range and within 360 days    Creatinine, Ser  Date Value Ref Range Status  02/19/2023 1.03 0.76 - 1.27 mg/dL Final          Passed - Valid encounter within last 12 months    Recent Outpatient Visits           1 month ago Type 2 diabetes mellitus without complication, without long-term current use of insulin (HCC)   [place] [facility] [place], [place], PA-C   2 months ago Mixed hyperlipidemia   [place] [facility] [place], [place], PA-C   3 months ago Encounter for medical examination to establish care   [facility] [place], [place], PA-C       Future Appointments             In 5 days End, Cristal Deer, MD [facility] [place]   In 3 weeks Raechel Chute, MD [facility] at [place]   In 1 month Debera Lat, PA-C [place] [facility], [facility]            Signed Prescriptions Disp Refills   cetirizine (ZYRTEC) 10 MG chewable tablet      Sig: Chew 10 mg by mouth daily.     There is no refill protocol information for this order

## 2023-03-24 NOTE — Telephone Encounter (Unsigned)
 Copied from CRM [id number]. Topic: Clinical - Medication Refill >> Mar 24, 2023  9:21 AM [name] wrote: Most Recent Primary Care Visit:  Provider: Debera Lat  Department[id number]  Visit Type: OFFICE VISIT  Date: 02/19/2023  Medication: cetirizine hcl 10 mg  Has the patient contacted their pharmacy? No (Agent: If no, request that the patient contact the pharmacy for the refill. If patient does not wish to contact the pharmacy document the reason why and proceed with request.) (Agent: If yes, when and what did the pharmacy advise?)  Is this the correct pharmacy for this prescription? Yes If no, delete pharmacy and type the correct one.  This is the patient's preferred pharmacy:  [id number] - [place], [place] - [address] [place] [address] Phone: [phone] Fax: [phone]     Has the prescription been filled recently? No  Is the patient out of the medication? Yes  Has the patient been seen for an appointment in the last year OR does the patient have an upcoming appointment? Yes  Can we respond through MyChart? No  Agent: Please be advised that Rx refills may take up to 3 business days. We ask that you follow-up with your pharmacy.

## 2023-03-24 NOTE — Telephone Encounter (Signed)
 Duplicate request    Requested Prescriptions  Pending Prescriptions Disp Refills   ARIPiprazole (ABILIFY) 10 MG tablet 30 tablet 0    Sig: Take 1 tablet (10 mg total) by mouth daily.     Not Delegated - Psychiatry:  Antipsychotics - Second Generation (Atypical) - aripiprazole Failed - 03/24/2023  5:37 PM      Failed - This refill cannot be delegated      Failed - Lipid Panel in normal range within the last 12 months    Cholesterol, Total  Date Value Ref Range Status  12/10/2022 173 100 - 199 mg/dL Final   LDL Chol Calc (NIH)  Date Value Ref Range Status  12/10/2022 114 (H) 0 - 99 mg/dL Final   HDL  Date Value Ref Range Status  12/10/2022 37 (L) >39 mg/dL Final   Triglycerides  Date Value Ref Range Status  12/10/2022 124 0 - 149 mg/dL Final         Passed - TSH in normal range and within 360 days    TSH  Date Value Ref Range Status  12/10/2022 2.460 0.450 - 4.500 uIU/mL Final         Passed - Completed PHQ-2 or PHQ-9 in the last 360 days      Passed - Last BP in normal range    BP Readings from Last 1 Encounters:  03/11/23 121/69         Passed - Last Heart Rate in normal range    Pulse Readings from Last 1 Encounters:  03/11/23 84         Passed - Valid encounter within last 6 months    Recent Outpatient Visits           1 month ago Type 2 diabetes mellitus without complication, without long-term current use of insulin (HCC)   [place] [facility] [place], [place], PA-C   2 months ago Mixed hyperlipidemia   [place] [facility] [place], [place], PA-C   3 months ago Encounter for medical examination to establish care   [place] [facility] [place], [place], PA-C       Future Appointments             In 5 days End, Cristal Deer, MD [place] [facility] [place]   In 3 weeks Raechel Chute, MD [facility] at [place]   In 1 month Ostwalt, [place], PA-C [place] [facility], [facility]            Passed - CBC within normal limits and completed in the last 12 months    WBC  Date Value Ref Range Status  02/19/2023 12.0 (H) 3.4 - 10.8 x10E3/uL Final  02/18/2023 15.2 (H) 4.0 - 10.5 K/uL Final   RBC  Date Value Ref Range Status  02/19/2023 2.84 (L) 4.14 - 5.80 x10E6/uL Final  02/18/2023 2.64 (L) 4.22 - 5.81 MIL/uL Final   Hemoglobin  Date Value Ref Range Status  02/19/2023 8.6 (L) 13.0 - 17.7 g/dL Final   Hematocrit  Date Value Ref Range Status  02/19/2023 26.2 (L) 37.5 - 51.0 % Final   MCHC  Date Value Ref Range Status  02/19/2023 32.8 31.5 - 35.7 g/dL Final  53/66/4403 47.4 30.0 - 36.0 g/dL Final   [facility]  Date Value Ref Range Status  02/19/2023 30.3 26.6 - 33.0 pg Final  02/18/2023 30.3 26.0 - 34.0 pg Final   MCV  Date Value Ref Range Status  02/19/2023  92 79 - 97 fL Final   No results found for: "PLTCOUNTKUC", "LABPLAT", "POCPLA" RDW  Date Value Ref Range Status  02/19/2023 14.3 11.6 - 15.4 % Final         Passed - CMP within normal limits and completed in the last 12 months    Albumin  Date Value Ref Range Status  02/17/2023 2.6 (L) 3.5 - 5.0 g/dL Final  16/11/9602 4.6 3.9 - 4.9 g/dL Final   Alkaline Phosphatase  Date Value Ref Range Status  02/16/2023 55 38 - 126 U/L Final   ALT  Date Value Ref Range Status  02/16/2023 11 0 - 44 U/L Final   AST  Date Value Ref Range Status  02/16/2023 11 (L) 15 - 41 U/L Final   BUN  Date Value Ref Range Status  02/19/2023 11 8 - 27 mg/dL Final   Calcium  Date Value Ref Range Status  02/19/2023 9.0 8.6 - 10.2 mg/dL Final   Calcium, Ion  Date Value Ref Range Status  02/16/2023 1.24 1.15 - 1.40 mmol/L Final   CO2  Date Value Ref Range Status  02/19/2023 24 20 - 29 mmol/L Final   TCO2  Date Value Ref Range Status  02/16/2023 24 22 - 32 mmol/L Final   Creatinine, Ser  Date Value Ref Range Status  02/19/2023 1.03 0.76 - 1.27 mg/dL Final   Glucose  Date Value Ref  Range Status  02/19/2023 132 (H) 70 - 99 mg/dL Final   Glucose, Bld  Date Value Ref Range Status  02/17/2023 206 (H) 70 - 99 mg/dL Final    Comment:    Glucose reference range applies only to samples taken after fasting for at least 8 hours.   Glucose-Capillary  Date Value Ref Range Status  02/18/2023 133 (H) 70 - 99 mg/dL Final    Comment:    Glucose reference range applies only to samples taken after fasting for at least 8 hours.   Potassium  Date Value Ref Range Status  02/19/2023 3.8 3.5 - 5.2 mmol/L Final   Sodium  Date Value Ref Range Status  02/19/2023 142 134 - 144 mmol/L Final   Total Bilirubin  Date Value Ref Range Status  02/16/2023 0.6 0.0 - 1.2 mg/dL Final   Bilirubin Total  Date Value Ref Range Status  12/10/2022 0.5 0.0 - 1.2 mg/dL Final   Protein, ur  Date Value Ref Range Status  08/24/2019 NEGATIVE NEGATIVE mg/dL Final   Total Protein  Date Value Ref Range Status  02/16/2023 5.5 (L) 6.5 - 8.1 g/dL Final  54/10/8117 6.8 6.0 - 8.5 g/dL Final   GFR calc Af Amer  Date Value Ref Range Status  08/24/2019 >60 >60 mL/min Final   eGFR  Date Value Ref Range Status  02/19/2023 81 >59 mL/min/1.73 Final   GFR, Estimated  Date Value Ref Range Status  02/17/2023 >60 >60 mL/min Final    Comment:    (NOTE) Calculated using the CKD-EPI Creatinine Equation (2021)           cetirizine (ZYRTEC) 10 MG chewable tablet      Sig: Chew 1 tablet (10 mg total) by mouth daily.     Ear, Nose, and Throat:  Antihistamines 2 Passed - 03/24/2023  5:37 PM      Passed - Cr in normal range and within 360 days    Creatinine, Ser  Date Value Ref Range Status  02/19/2023 1.03 0.76 - 1.27 mg/dL Final  Passed - Valid encounter within last 12 months    Recent Outpatient Visits           1 month ago Type 2 diabetes mellitus without complication, without long-term current use of insulin (HCC)   [facility], [place], PA-C   2  months ago Mixed hyperlipidemia   [place] [facility] [place], [place], PA-C   3 months ago Encounter for medical examination to establish care   [facility] [place], [place], PA-C       Future Appointments             In 5 days End, Cristal Deer, MD [facility] at [place]   In 3 weeks Raechel Chute, MD [facility] at [place]   In 1 month Debera Lat, PA-C [facility], [facility]            Signed Prescriptions Disp Refills   cetirizine (ZYRTEC) 10 MG chewable tablet      Sig: Chew 10 mg by mouth daily.     There is no refill protocol information for this order

## 2023-03-25 ENCOUNTER — Other Ambulatory Visit (HOSPITAL_COMMUNITY): Payer: Self-pay

## 2023-03-25 ENCOUNTER — Telehealth: Payer: Self-pay

## 2023-03-25 MED ORDER — CETIRIZINE HCL 10 MG PO CHEW: 10.0000 mg | CHEWABLE_TABLET | Freq: Every day | ORAL | 1 refills | Status: DC

## 2023-03-25 NOTE — Telephone Encounter (Signed)
 Pharmacy Patient Advocate Encounter   Received notification from CoverMyMeds that prior authorization for Cetirizine HCl 10MG  chewable tablets is required/requested.   Insurance verification completed.   The patient is insured through [employer] .   Per test claim: PA required; PA submitted to above mentioned insurance via CoverMyMeds Key/confirmation #/EOC [id number] Status is pending

## 2023-03-29 ENCOUNTER — Encounter: Payer: Self-pay | Admitting: Internal Medicine

## 2023-03-29 ENCOUNTER — Ambulatory Visit: Payer: MEDICAID | Attending: Internal Medicine | Admitting: Internal Medicine

## 2023-03-29 VITALS — BP 120/70 | HR 79 | Ht 70.0 in | Wt 233.0 lb

## 2023-03-29 DIAGNOSIS — R0602 Shortness of breath: Secondary | ICD-10-CM | POA: Insufficient documentation

## 2023-03-29 DIAGNOSIS — R079 Chest pain, unspecified: Secondary | ICD-10-CM | POA: Diagnosis not present

## 2023-03-29 NOTE — Patient Instructions (Signed)
 Medication Instructions:  Your physician recommends that you continue on your current medications as directed. Please refer to the Current Medication list given to you today.   *If you need a refill on your cardiac medications before your next appointment, please call your pharmacy*   Lab Work: No labs ordered today .   Testing/Procedures: Your physician has requested that you have an echocardiogram. Echocardiography is a painless test that uses sound waves to create images of your heart. It provides your doctor with information about the size and shape of your heart and how well your heart's chambers and valves are working.   You may receive an ultrasound enhancing agent through an IV if needed to better visualize your heart during the echo. This procedure takes approximately one hour.  There are no restrictions for this procedure.  This will take place at [address] [facility] [address], [place] [address]  Please note: We ask at that you not bring children with you during ultrasound (echo/ vascular) testing. Due to room size and safety concerns, children are not allowed in the ultrasound rooms during exams. Our front office staff cannot provide observation of children in our lobby area while testing is being conducted. An adult accompanying a patient to their appointment will only be allowed in the ultrasound room at the discretion of the ultrasound technician under special circumstances. We apologize for any inconvenience.    Follow-Up: At [facility], you and your health needs are our priority.  As part of our continuing mission to provide you with exceptional heart care, we have created designated Provider Care Teams.  These Care Teams include your primary Cardiologist (physician) and Advanced Practice Providers (APPs -  Physician Assistants and Nurse Practitioners) who all work together to provide you with the care you need, when you need it.  We recommend  signing up for the patient portal called "MyChart".  Sign up information is provided on this After Visit Summary.  MyChart is used to connect with patients for Virtual Visits (Telemedicine).  Patients are able to view lab/test results, encounter notes, upcoming appointments, etc.  Non-urgent messages can be sent to your provider as well.   To learn more about what you can do with MyChart, go to [email or web address].    Your next appointment:   6 week(s)  Provider:   You may see Yvonne Kendall, MD or one of the following Advanced Practice Providers on your designated Care Team:   Nicolasa Ducking, NP Eula Listen, PA-C Cadence Fransico Michael, PA-C Charlsie Quest, NP Carlos Levering, NP

## 2023-03-29 NOTE — Progress Notes (Signed)
  Cardiology Office Note:  .   Date:  03/29/2023  ID:  Victor Alvarez, DOB 04/15/1960, MRN [id number] PCP: Debera Lat, PA-C  [facility] Providers Cardiologist:  Yvonne Kendall, MD     History of Present Illness: Victor Alvarez   Victor Alvarez is a 63 y.o. male hyperlipidemia, type 2 diabetes mellitus, obstructive sleep apnea, COPD, tracheobronchomalacia, and bipolar disorder, presenting for evaluation of shortness of breath at the request of Dr. Aundria Rud.  Victor Alvarez reports having dyspnea for many years, probably more than a decade.  However, it has gotten progressively worse.  He sometimes feels short of breath just bending over.  He also noticed mildly dyspnea walking from the waiting room to the exam room today.  He also reports occasional twinges of chest pain that happen randomly and are not associated with exertion or other activities.  Las episode was 3-4 days ago.  He denies palpitations and edema.  He typically does not get dizzy but endorses mild lightheadedness walking across the parking lot this morning.  He has not passed out.  He denies prior cardiac testing.  He continues to smoke but is down to 8-10 cigarettes per day (previously smoked 1 PPD).  ROS: See HPI  Studies Reviewed: Victor Alvarez   EKG Interpretation Date/Time:  Monday March 29 2023 11:10:44 EST Ventricular Rate:  79 PR Interval:  170 QRS Duration:  72 QT Interval:  356 QTC Calculation: 408 R Axis:   61  Text Interpretation: Normal sinus rhythm Normal ECG When compared with ECG of 16-Feb-2023 17:58, No significant change was found Confirmed by Ambriana Selway, Cristal Deer [phone]) on 03/29/2023 2:25:00 PM    Risk Assessment/Calculations:             Physical Exam:   VS:  BP 120/70 (BP Location: Left Arm, Patient Position: Sitting, Cuff Size: Normal)   Pulse 79   Ht 5\' 10"  (1.778 m)   Wt 233 lb (105.7 kg)   SpO2 94%   BMI 33.43 kg/m    Wt Readings from Last 3 Encounters:  03/29/23 233 lb (105.7 kg)  03/11/23 231 lb (104.8 kg)   02/25/23 229 lb 3.2 oz (104 kg)    General:  NAD. Neck: No JVD or HJR. Lungs: Mildly diminished breath sounds throughout without wheezes or crackles. Heart: Regular rate and rhythm without murmurs, rubs, or gallops. Abdomen: Soft, nontender, nondistended. Extremities: No lower extremity edema.  ASSESSMENT AND PLAN: .    Shortness of breath and chest pain: I suspect chronic and progressive dyspnea is most likely related to COPD with ongoing tobacco use.  However, Victor Alvarez has multiple cardiac risk factors.  We have agreed to obtain an echocardiogram.  If this does not reveal any significant structural abnormalities, we will subsequently proceed with coronary CTA.  Continue current medications.  I encouraged Victor Alvarez to keep working on smoking cessation.    Dispo: Return to clinic in 6 weeks.  Signed, Yvonne Kendall, MD

## 2023-03-29 NOTE — Telephone Encounter (Signed)
 Pharmacy Patient Advocate Encounter  Received notification from [facility] that Prior Authorization for Cetirizine HCl 10MG  chewable tablets has been DENIED.  See denial reason below. No denial letter attached in CMM. Will attach denial letter to Media tab once received.   PA #/Case ID/Reference #: [id number]

## 2023-04-05 ENCOUNTER — Other Ambulatory Visit: Payer: Self-pay | Admitting: Physician Assistant

## 2023-04-05 DIAGNOSIS — J302 Other seasonal allergic rhinitis: Secondary | ICD-10-CM

## 2023-04-05 NOTE — Telephone Encounter (Unsigned)
 Copied from CRM [id number]. Topic: Clinical - Medication Refill >> Apr 05, 2023 12:22 PM Hamdi H wrote: Most Recent Primary Care Visit:  Provider: Debera Lat  Department[facility]  Visit Type: OFFICE VISIT  Date: 02/19/2023  Medication: ARIPiprazole (ABILIFY) 10 MG tablet cetirizine (ZYRTEC) 10 MG chewable tablet Cholecalciferol (VITAMIN D3) 50 MCG (2000 UT) capsule  ( He needs this one to be a soft gel)    Has the patient contacted their pharmacy? Yes (Agent: If no, request that the patient contact the pharmacy for the refill. If patient does not wish to contact the pharmacy document the reason why and proceed with request.) (Agent: If yes, when and what did the pharmacy advise?) Pharmacy asked him to call provider to refill these prescriptions.   Is this the correct pharmacy for this prescription? Yes If no, delete pharmacy and type the correct one.  This is the patient's preferred pharmacy:  [id number] - [place], [place] - [address] [place] [address] Phone: [phone] Fax: [phone]   Has the prescription been filled recently? No  Is the patient out of the medication? Yes  Has the patient been seen for an appointment in the last year OR does the patient have an upcoming appointment? Yes  Can we respond through MyChart? Yes  Agent: Please be advised that Rx refills may take up to 3 business days. We ask that you follow-up with your pharmacy.

## 2023-04-06 ENCOUNTER — Ambulatory Visit: Payer: MEDICAID | Attending: Otolaryngology

## 2023-04-06 ENCOUNTER — Other Ambulatory Visit: Payer: Self-pay | Admitting: Physician Assistant

## 2023-04-06 DIAGNOSIS — G4761 Periodic limb movement disorder: Secondary | ICD-10-CM | POA: Diagnosis not present

## 2023-04-06 DIAGNOSIS — E669 Obesity, unspecified: Secondary | ICD-10-CM | POA: Diagnosis not present

## 2023-04-06 DIAGNOSIS — J449 Chronic obstructive pulmonary disease, unspecified: Secondary | ICD-10-CM | POA: Insufficient documentation

## 2023-04-06 DIAGNOSIS — G4752 REM sleep behavior disorder: Secondary | ICD-10-CM | POA: Insufficient documentation

## 2023-04-06 DIAGNOSIS — G4733 Obstructive sleep apnea (adult) (pediatric): Secondary | ICD-10-CM | POA: Insufficient documentation

## 2023-04-06 DIAGNOSIS — E119 Type 2 diabetes mellitus without complications: Secondary | ICD-10-CM | POA: Diagnosis not present

## 2023-04-06 DIAGNOSIS — R0683 Snoring: Secondary | ICD-10-CM | POA: Diagnosis present

## 2023-04-06 DIAGNOSIS — J302 Other seasonal allergic rhinitis: Secondary | ICD-10-CM

## 2023-04-06 NOTE — Telephone Encounter (Signed)
 Requested medication (s) are due for refill today: yes  Requested medication (s) are on the active medication list: yes  Last refill:  02/09/23 #30/0  Future visit scheduled: yes  Notes to clinic:  Unable to refill per protocol, cannot delegate.      Requested Prescriptions  Pending Prescriptions Disp Refills   ARIPiprazole (ABILIFY) 10 MG tablet 30 tablet 0    Sig: Take 1 tablet (10 mg total) by mouth daily.     Not Delegated - Psychiatry:  Antipsychotics - Second Generation (Atypical) - aripiprazole Failed - 04/06/2023  3:18 PM      Failed - This refill cannot be delegated      Failed - Lipid Panel in normal range within the last 12 months    Cholesterol, Total  Date Value Ref Range Status  12/10/2022 173 100 - 199 mg/dL Final   LDL Chol Calc (NIH)  Date Value Ref Range Status  12/10/2022 114 (H) 0 - 99 mg/dL Final   HDL  Date Value Ref Range Status  12/10/2022 37 (L) >39 mg/dL Final   Triglycerides  Date Value Ref Range Status  12/10/2022 124 0 - 149 mg/dL Final         Passed - TSH in normal range and within 360 days    TSH  Date Value Ref Range Status  12/10/2022 2.460 0.450 - 4.500 uIU/mL Final         Passed - Completed PHQ-2 or PHQ-9 in the last 360 days      Passed - Last BP in normal range    BP Readings from Last 1 Encounters:  03/29/23 120/70         Passed - Last Heart Rate in normal range    Pulse Readings from Last 1 Encounters:  03/29/23 79         Passed - Valid encounter within last 6 months    Recent Outpatient Visits           1 month ago Type 2 diabetes mellitus without complication, without long-term current use of insulin (HCC)   [facility] [place], [place], PA-C   2 months ago Mixed hyperlipidemia   [facility] [place], [place], PA-C   3 months ago Encounter for medical examination to establish care   [facility] [place], [place], PA-C       Future  Appointments             In 1 week Raechel Chute, MD [facility] at [place]   In 2 weeks Debera Lat, PA-C [place], [facility]   In 1 month Dunn, Raymon Mutton, PA-C [facility] - CBC within normal limits and completed in the last 12 months    WBC  Date Value Ref Range Status  02/19/2023 12.0 (H) 3.4 - 10.8 x10E3/uL Final  02/18/2023 15.2 (H) 4.0 - 10.5 K/uL Final   RBC  Date Value Ref Range Status  02/19/2023 2.84 (L) 4.14 - 5.80 x10E6/uL Final  02/18/2023 2.64 (L) 4.22 - 5.81 MIL/uL Final   Hemoglobin  Date Value Ref Range Status  02/19/2023 8.6 (L) 13.0 - 17.7 g/dL Final   Hematocrit  Date Value Ref Range Status  02/19/2023 26.2 (L) 37.5 - 51.0 % Final   MCHC  Date Value Ref Range Status  02/19/2023 32.8 31.5 - 35.7 g/dL Final  02/18/2023 34.0 30.0 - 36.0 g/dL Final   [facility]  Date Value Ref Range Status  02/19/2023 30.3 26.6 - 33.0 pg Final  02/18/2023 30.3 26.0 - 34.0 pg Final   MCV  Date Value Ref Range Status  02/19/2023 92 79 - 97 fL Final   No results found for: "PLTCOUNTKUC", "LABPLAT", "POCPLA" RDW  Date Value Ref Range Status  02/19/2023 14.3 11.6 - 15.4 % Final         Passed - CMP within normal limits and completed in the last 12 months    Albumin  Date Value Ref Range Status  02/17/2023 2.6 (L) 3.5 - 5.0 g/dL Final  40/98/1191 4.6 3.9 - 4.9 g/dL Final   Alkaline Phosphatase  Date Value Ref Range Status  02/16/2023 55 38 - 126 U/L Final   ALT  Date Value Ref Range Status  02/16/2023 11 0 - 44 U/L Final   AST  Date Value Ref Range Status  02/16/2023 11 (L) 15 - 41 U/L Final   BUN  Date Value Ref Range Status  02/19/2023 11 8 - 27 mg/dL Final   Calcium  Date Value Ref Range Status  02/19/2023 9.0 8.6 - 10.2 mg/dL Final   Calcium, Ion  Date Value Ref Range Status  02/16/2023 1.24 1.15 - 1.40 mmol/L Final   CO2  Date Value Ref Range Status   02/19/2023 24 20 - 29 mmol/L Final   TCO2  Date Value Ref Range Status  02/16/2023 24 22 - 32 mmol/L Final   Creatinine, Ser  Date Value Ref Range Status  02/19/2023 1.03 0.76 - 1.27 mg/dL Final   Glucose  Date Value Ref Range Status  02/19/2023 132 (H) 70 - 99 mg/dL Final   Glucose, Bld  Date Value Ref Range Status  02/17/2023 206 (H) 70 - 99 mg/dL Final    Comment:    Glucose reference range applies only to samples taken after fasting for at least 8 hours.   Glucose-Capillary  Date Value Ref Range Status  02/18/2023 133 (H) 70 - 99 mg/dL Final    Comment:    Glucose reference range applies only to samples taken after fasting for at least 8 hours.   Potassium  Date Value Ref Range Status  02/19/2023 3.8 3.5 - 5.2 mmol/L Final   Sodium  Date Value Ref Range Status  02/19/2023 142 134 - 144 mmol/L Final   Total Bilirubin  Date Value Ref Range Status  02/16/2023 0.6 0.0 - 1.2 mg/dL Final   Bilirubin Total  Date Value Ref Range Status  12/10/2022 0.5 0.0 - 1.2 mg/dL Final   Protein, ur  Date Value Ref Range Status  08/24/2019 NEGATIVE NEGATIVE mg/dL Final   Total Protein  Date Value Ref Range Status  02/16/2023 5.5 (L) 6.5 - 8.1 g/dL Final  47/82/9562 6.8 6.0 - 8.5 g/dL Final   GFR calc Af Amer  Date Value Ref Range Status  08/24/2019 >60 >60 mL/min Final   eGFR  Date Value Ref Range Status  02/19/2023 81 >59 mL/min/1.73 Final   GFR, Estimated  Date Value Ref Range Status  02/17/2023 >60 >60 mL/min Final    Comment:    (NOTE) Calculated using the CKD-EPI Creatinine Equation (2021)

## 2023-04-06 NOTE — Telephone Encounter (Unsigned)
 Copied from CRM [id number]. Topic: Clinical - Medication Refill >> Apr 06, 2023 11:11 AM Carlatta H wrote: Most Recent Primary Care Visit:  Provider: Debera Lat  Department[facility]  Visit Type: OFFICE VISIT  Date: 02/19/2023  Medication:  Cholecalciferol (VITAMIN D3) 50 MCG (2000 UT) capsule [[id number]] cetirizine (ZYRTEC) 10 MG chewable tablet [[id number]]   Has the patient contacted their pharmacy? Yes (Agent: If no, request that the patient contact the pharmacy for the refill. If patient does not wish to contact the pharmacy document the reason why and proceed with request.) (Agent: If yes, when and what did the pharmacy advise?)  Is this the correct pharmacy for this prescription? Yes If no, delete pharmacy and type the correct one.  This is the patient's preferred pharmacy:  [id number] - [place], [place] - [address] [place] [address] Phone: [phone] Fax: [phone]    Has the prescription been filled recently? No  Is the patient out of the medication? Yes  Has the patient been seen for an appointment in the last year OR does the patient have an upcoming appointment? Yes  Can we respond through MyChart? Yes  Agent: Please be advised that Rx refills may take up to 3 business days. We ask that you follow-up with your pharmacy.

## 2023-04-07 ENCOUNTER — Other Ambulatory Visit: Payer: Self-pay | Admitting: Internal Medicine

## 2023-04-07 DIAGNOSIS — R079 Chest pain, unspecified: Secondary | ICD-10-CM

## 2023-04-07 DIAGNOSIS — R0602 Shortness of breath: Secondary | ICD-10-CM

## 2023-04-07 NOTE — Telephone Encounter (Signed)
 Rx 03/25/23 #90 1RF- duplicate request Requested Prescriptions  Pending Prescriptions Disp Refills   cetirizine (ZYRTEC) 10 MG chewable tablet 90 tablet 1    Sig: Chew 1 tablet (10 mg total) by mouth daily.     Ear, Nose, and Throat:  Antihistamines 2 Passed - 04/07/2023 12:06 PM      Passed - Cr in normal range and within 360 days    Creatinine, Ser  Date Value Ref Range Status  02/19/2023 1.03 0.76 - 1.27 mg/dL Final         Passed - Valid encounter within last 12 months    Recent Outpatient Visits           1 month ago Type 2 diabetes mellitus without complication, without long-term current use of insulin (HCC)   [place], [place], PA-C   2 months ago Mixed hyperlipidemia   [facility] [place], [place], PA-C   3 months ago Encounter for medical examination to establish care   [facility] [place] [place], [place], [place]       Future Appointments             In 1 week Raechel Chute, MD [facility] at [place]   In 2 weeks Debera Lat, PA-C [facility], [facility]   In 1 month Dunn, Raymon Mutton, PA-C [facility]             Cholecalciferol (VITAMIN D3) 50 MCG (2000 UT) capsule      Sig: Take 1 capsule (2,000 Units total) by mouth daily.     Endocrinology:  Vitamins - Vitamin D Supplementation 2 Failed - 04/07/2023 12:06 PM      Failed - Manual Review: Route requests for 50,000 IU strength to the provider      Failed - Vitamin D in normal range and within 360 days    No results found for: "NW2956OZ3", "YQ6578IO9", "GE952WU1LKG", "25OHVITD3", "25OHVITD2", "25OHVITD1", "VD25OH"       Passed - Ca in normal range and within 360 days    Calcium  Date Value Ref Range Status  02/19/2023 9.0 8.6 - 10.2 mg/dL Final   Calcium, Ion  Date Value Ref Range Status  02/16/2023 1.24 1.15 - 1.40 mmol/L Final         Passed - Valid  encounter within last 12 months    Recent Outpatient Visits           1 month ago Type 2 diabetes mellitus without complication, without long-term current use of insulin (HCC)   [place] [facility] [place], [place], PA-C   2 months ago Mixed hyperlipidemia   [place] [place], [place], PA-C   3 months ago Encounter for medical examination to establish care   [facility] [place], [place], [place]       Future Appointments             In 1 week Raechel Chute, MD [facility] at [place]   In 2 weeks Debera Lat, PA-C [facility], [facility]   In 1 month Dunn, Raymon Mutton, PA-C [facility] at [facility]

## 2023-04-07 NOTE — Telephone Encounter (Signed)
 Requested medication (s) are due for refill today - yes  Requested medication (s) are on the active medication list -yes  Future visit scheduled -yes  Last refill: 10/30/22  Notes to clinic: listed as historical medication - sent for review   Requested Prescriptions  Pending Prescriptions Disp Refills   Cholecalciferol (VITAMIN D3) 50 MCG (2000 UT) capsule      Sig: Take 1 capsule (2,000 Units total) by mouth daily.     Endocrinology:  Vitamins - Vitamin D Supplementation 2 Failed - 04/07/2023 12:08 PM      Failed - Manual Review: Route requests for 50,000 IU strength to the provider      Failed - Vitamin D in normal range and within 360 days    No results found for: "RU0454UJ8", "JX9147WG9", "VD125OH2TOT", "25OHVITD3", "25OHVITD2", "25OHVITD1", "VD25OH"       Passed - Ca in normal range and within 360 days    Calcium  Date Value Ref Range Status  02/19/2023 9.0 8.6 - 10.2 mg/dL Final   Calcium, Ion  Date Value Ref Range Status  02/16/2023 1.24 1.15 - 1.40 mmol/L Final         Passed - Valid encounter within last 12 months    Recent Outpatient Visits           1 month ago Type 2 diabetes mellitus without complication, without long-term current use of insulin (HCC)   [place] [facility] [place], [place], PA-C   2 months ago Mixed hyperlipidemia   [facility] [place], [place], PA-C   3 months ago Encounter for medical examination to establish care   [facility] [place], [place], [place]       Future Appointments             In 1 week Raechel Chute, MD [facility] [place] [facility] at [place]   In 2 weeks Debera Lat, PA-C [facility], [facility]   In 1 month Dunn, Raymon Mutton, PA-C [facility] [employer] Prescriptions Disp Refills   cetirizine (ZYRTEC) 10 MG chewable tablet 90 tablet 1    Sig: Chew 1 tablet (10 mg total) by mouth daily.      Ear, Nose, and Throat:  Antihistamines 2 Passed - 04/07/2023 12:08 PM      Passed - Cr in normal range and within 360 days    Creatinine, Ser  Date Value Ref Range Status  02/19/2023 1.03 0.76 - 1.27 mg/dL Final         Passed - Valid encounter within last 12 months    Recent Outpatient Visits           1 month ago Type 2 diabetes mellitus without complication, without long-term current use of insulin (HCC)   [place] [place], [place], PA-C   2 months ago Mixed hyperlipidemia   [place] [facility] [place], [place], PA-C   3 months ago Encounter for medical examination to establish care   [facility] [place], [place], [place]       Future Appointments             In 1 week Raechel Chute, MD [facility] at [place]   In 2 weeks Debera Lat, PA-C [facility], [facility]   In 1 month Dunn, Raymon Mutton, PA-C [facility] [facility]  Requested Prescriptions  Pending Prescriptions Disp Refills   Cholecalciferol (VITAMIN D3) 50 MCG (2000 UT) capsule      Sig: Take 1 capsule (2,000 Units total) by mouth daily.     Endocrinology:  Vitamins - Vitamin D Supplementation 2 Failed - 04/07/2023 12:08 PM      Failed - Manual Review: Route requests for 50,000 IU strength to the provider      Failed - Vitamin D in normal range and within 360 days    No results found for: "WU9811BJ4", "NW2956OZ3", "VD125OH2TOT", "25OHVITD3", "25OHVITD2", "25OHVITD1", "VD25OH"       Passed - Ca in normal range and within 360 days    Calcium  Date Value Ref Range Status  02/19/2023 9.0 8.6 - 10.2 mg/dL Final   Calcium, Ion  Date Value Ref Range Status  02/16/2023 1.24 1.15 - 1.40 mmol/L Final         Passed - Valid encounter within last 12 months    Recent Outpatient Visits           1 month ago Type 2 diabetes mellitus without complication, without long-term  current use of insulin (HCC)   [place] [facility] [place], [place], PA-C   2 months ago Mixed hyperlipidemia   [facility] [place], [place], PA-C   3 months ago Encounter for medical examination to establish care   [facility] [place], [place], [place]       Future Appointments             In 1 week Raechel Chute, MD [facility] [place] [facility] at [place]   In 2 weeks Debera Lat, PA-C [facility], [facility]   In 1 month Dunn, Raymon Mutton, PA-C [place] [facility] [employer] Prescriptions Disp Refills   cetirizine (ZYRTEC) 10 MG chewable tablet 90 tablet 1    Sig: Chew 1 tablet (10 mg total) by mouth daily.     Ear, Nose, and Throat:  Antihistamines 2 Passed - 04/07/2023 12:08 PM      Passed - Cr in normal range and within 360 days    Creatinine, Ser  Date Value Ref Range Status  02/19/2023 1.03 0.76 - 1.27 mg/dL Final         Passed - Valid encounter within last 12 months    Recent Outpatient Visits           1 month ago Type 2 diabetes mellitus without complication, without long-term current use of insulin (HCC)   [place] [facility] [place], [place], PA-C   2 months ago Mixed hyperlipidemia   [place] [place], [place], PA-C   3 months ago Encounter for medical examination to establish care   [facility] [place], [place], [place]       Future Appointments             In 1 week Raechel Chute, MD [facility] at [place]   In 2 weeks Debera Lat, PA-C [facility], [facility]   In 1 month Dunn, Raymon Mutton, PA-C [place] [facility] at [facility]

## 2023-04-08 ENCOUNTER — Telehealth: Payer: Self-pay

## 2023-04-08 MED ORDER — VITAMIN D3 50 MCG (2000 UT) PO CAPS: 2000.0000 [IU] | ORAL_CAPSULE | Freq: Every day | ORAL | 3 refills | Status: DC

## 2023-04-08 NOTE — Telephone Encounter (Signed)
 Copied from CRM [id number]. Topic: Clinical - Lab/Test Results >> Apr 08, 2023  9:05 AM Shon Hale wrote: Reason for CRM: Patient calling to see if sleep apnea tests are available.   Patient requesting call as soon as results are available, [phone]

## 2023-04-14 ENCOUNTER — Telehealth: Payer: Self-pay

## 2023-04-14 DIAGNOSIS — J3089 Other allergic rhinitis: Secondary | ICD-10-CM

## 2023-04-14 NOTE — Telephone Encounter (Signed)
 Copied from CRM [id number]. Topic: Clinical - Prescription Issue >> Apr 14, 2023 10:56 AM Antwanette L wrote: Reason for CRM: Harriett Sine from [facility] is calling to let the patient provider know that his insurance company will not cover cetirizine (ZYRTEC) 10 MG chewable tablet. Harriett Sine who is the patient case manager wants to know if Debera Lat can prescribe an alternative because the patient is currently out of the medicine. Harriett Sine can be contacted at [phone]

## 2023-04-15 ENCOUNTER — Encounter: Payer: Self-pay | Admitting: Student in an Organized Health Care Education/Training Program

## 2023-04-15 ENCOUNTER — Ambulatory Visit: Payer: MEDICAID | Admitting: Student in an Organized Health Care Education/Training Program

## 2023-04-15 VITALS — BP 108/60 | HR 85 | Temp 98.7°F | Ht 70.0 in | Wt 234.6 lb

## 2023-04-15 DIAGNOSIS — R0602 Shortness of breath: Secondary | ICD-10-CM

## 2023-04-15 DIAGNOSIS — J439 Emphysema, unspecified: Secondary | ICD-10-CM

## 2023-04-15 DIAGNOSIS — J398 Other specified diseases of upper respiratory tract: Secondary | ICD-10-CM

## 2023-04-15 DIAGNOSIS — F17210 Nicotine dependence, cigarettes, uncomplicated: Secondary | ICD-10-CM

## 2023-04-15 DIAGNOSIS — F17200 Nicotine dependence, unspecified, uncomplicated: Secondary | ICD-10-CM

## 2023-04-15 DIAGNOSIS — G4733 Obstructive sleep apnea (adult) (pediatric): Secondary | ICD-10-CM

## 2023-04-15 MED ORDER — CETIRIZINE HCL 10 MG PO TABS: 10.0000 mg | ORAL_TABLET | Freq: Every day | ORAL | 11 refills | Status: DC

## 2023-04-15 NOTE — Addendum Note (Signed)
 Addended by: Debera Lat on: 04/15/2023 06:31 AM   Modules accepted: Orders

## 2023-04-17 NOTE — Progress Notes (Signed)
 Assessment & Plan:   #Pulmonary emphysema, unspecified emphysema type (HCC) #Shortness of breath #Tracheobronchomalacia #Tobacco use disorder  Presents for follow up of shortness of breath with history of smoking and findings of emphysema and bronchial wall thickening on HRCT suggestive of COPD. HRCT is notable for tracheobronchomalacia that is likely contributing to his symptoms (including the coughing fits). He has not yet had his PFT's.  He was switched to LABA/LAMA therapy during our previous visit (currently on Stiolto Respimat) with improvement in his symptoms.  His exam and history are also suggestive of OSA (he was previously on CPAP, see below).  Treatment for sleep apnea will likely help with TBM by stenting the airway and we will initiate the workup (see below). Will consider adding a clearance regimen as well as ICS to his treatment plan after we obtain PFT's and the sleep study.   -Continue Stiolto Respimat 2 puffs once daily -Need to schedule PFT's -Purse lip breathing with coughing fits -will consider thoracic surgery referral for TBM after PFT's -Smoking cessation recommended  #OSA (obstructive sleep apnea)  Reports a history of sleep apnea but is not currently on CPAP. On exam he has a large neck circumference with a crowded oropharynx. He also reports apneic episodes and heavy snoring at night. He did have his split night sleep study and results are pending  -follow up on sleep study results   Return in about 3 months (around 07/16/2023).  I spent 30 minutes caring for this patient today, including preparing to see the patient, obtaining a medical history , reviewing a separately obtained history, performing a medically appropriate examination and/or evaluation, counseling and educating the patient/family/caregiver, documenting clinical information in the electronic health record, and independently interpreting results (not separately reported/billed) and communicating  results to the patient/family/caregiver  Raechel Chute, MD [facility]  End of visit medications:  No orders of the defined types were placed in this encounter.    Current Outpatient Medications:    Accu-Chek Softclix Lancets lancets, Use as instructed to check blood glucose daily. Dx E11.9, Disp: 100 each, Rfl: 12   albuterol (VENTOLIN HFA) 108 (90 Base) MCG/ACT inhaler, Inhale 2 puffs into the lungs every 6 (six) hours as needed for wheezing or shortness of breath., Disp: 6.7 g, Rfl: 5   aspirin 81 MG EC tablet, Take 81 mg by mouth daily., Disp: , Rfl:    B Complex-C (B-COMPLEX WITH VITAMIN C) tablet, Take 1 tablet by mouth daily., Disp: , Rfl:    Blood Glucose Monitoring Suppl (ONE TOUCH ULTRA 2) w/Device KIT, Use to check blood sugar daily. Dx E11.9, Disp: 1 kit, Rfl: 0   cariprazine (VRAYLAR) 3 MG capsule, Take 1 capsule (3 mg total) by mouth daily., Disp: 30 capsule, Rfl: 0   cetirizine (ZYRTEC) 10 MG chewable tablet, Chew 1 tablet (10 mg total) by mouth daily., Disp: 90 tablet, Rfl: 1   cetirizine (ZYRTEC) 10 MG tablet, Take 1 tablet (10 mg total) by mouth daily., Disp: 30 tablet, Rfl: 11   Cholecalciferol (VITAMIN D3) 50 MCG (2000 UT) capsule, Take 1 capsule (2,000 Units total) by mouth daily., Disp: 90 capsule, Rfl: 3   escitalopram (LEXAPRO) 20 MG tablet, Take 20 mg by mouth daily., Disp: , Rfl:    fluticasone (FLONASE) 50 MCG/ACT nasal spray, Place 2 sprays into both nostrils daily. sometimes uses in the evening as needed, Disp: 16 g, Rfl: 5   glucose blood (ONETOUCH ULTRA TEST) test strip, Use as instructed to  check blood glucose daily. Dx E11.9, Disp: 100 each, Rfl: 12   ibuprofen (ADVIL) 800 MG tablet, Take 1 tablet (800 mg total) by mouth every 8 (eight) hours as needed., Disp: 30 tablet, Rfl: 0   metoprolol succinate (TOPROL-XL) 25 MG 24 hr tablet, Take 1 tablet (25 mg total) by mouth daily., Disp: 30 tablet, Rfl: 5   omeprazole (PRILOSEC) 40 MG capsule,  Take 1 capsule (40 mg total) by mouth daily., Disp: 30 capsule, Rfl: 5   prazosin (MINIPRESS) 1 MG capsule, Take 2 mg by mouth at bedtime., Disp: , Rfl:    rosuvastatin (CRESTOR) 20 MG tablet, Take 1 tablet (20 mg total) by mouth daily., Disp: 90 tablet, Rfl: 1   Tiotropium Bromide-Olodaterol (STIOLTO RESPIMAT) 2.5-2.5 MCG/ACT AERS, Inhale 2 puffs into the lungs daily., Disp: 60 each, Rfl: 5   trazodone (DESYREL) 300 MG tablet, Take 300 mg by mouth at bedtime., Disp: , Rfl:    ARIPiprazole (ABILIFY) 10 MG tablet, Take 1 tablet (10 mg total) by mouth daily., Disp: 30 tablet, Rfl: 0   Subjective:   PATIENT ID: Victor Alvarez GENDER: male DOB: 1960/11/26, MRN: [id number]  Chief Complaint  Patient presents with   Follow-up    SOB and occ non prod cough.     HPI  Patient is a pleasant 63 year old male with a past medical history of diabetes, HTN, HLD, and depression/anxiety who presents for follow up of shortness of breath.   I first met Victor Alvarez in December of 2024, at which point we restarted his inhalers, switching him to LABA/LAMA therapy. I also ordered PFT's which he's still not had and HRCT to better evaluate lung parenchyma (emphysema and tracheobronchomalacia). He did have his sleep study but the results are pending. He is presenting for follow up and reports symptomatic improvement overall. He does continue to have fits of cough and shortness of breath, last being yesterday. He continues to smoke, currently at 1/2 pack per day.   Patient reports long standing symptoms of shortness of breath that is mostly exertional. He does not feel said symptoms at rest. He has a reported history of COPD. He was also previously told he has sleep apnea.    Patient has a history of cigarette smoking, with 1 pack a day for over 50 years. He is actively smoking 1/2 pack a day. He works as a Visual merchandiser (tobacco, grain, farm animals).  Ancillary information including prior medications, full  medical/surgical/family/social histories, and PFTs (when available) are listed below and have been reviewed.   Review of Systems  Constitutional:  Negative for chills, diaphoresis, fever, malaise/fatigue and weight loss.  Respiratory:  Positive for cough, sputum production, shortness of breath and wheezing. Negative for hemoptysis.   Cardiovascular:  Negative for chest pain and palpitations.     Objective:   Vitals:   04/15/23 0941  BP: 108/60  Pulse: 85  Temp: 98.7 F (37.1 C)  TempSrc: Oral  SpO2: 97%  Weight: 234 lb 9.6 oz (106.4 kg)  Height: 5\' 10"  (1.778 m)   97% on RA BMI Readings from Last 3 Encounters:  04/15/23 33.66 kg/m  03/29/23 33.43 kg/m  03/11/23 33.15 kg/m   Wt Readings from Last 3 Encounters:  04/15/23 234 lb 9.6 oz (106.4 kg)  03/29/23 233 lb (105.7 kg)  03/11/23 231 lb (104.8 kg)    Physical Exam Constitutional:      Appearance: Normal appearance.  Cardiovascular:     Rate and Rhythm: Normal rate and regular  rhythm.     Pulses: Normal pulses.     Heart sounds: Normal heart sounds.  Pulmonary:     Effort: No respiratory distress.     Breath sounds: No wheezing or rhonchi.  Abdominal:     Palpations: Abdomen is soft.  Neurological:     General: No focal deficit present.     Mental Status: He is alert and oriented to person, place, and time. Mental status is at baseline.       Ancillary Information    Past Medical History:  Diagnosis Date   Acute appendicitis 08/24/2019   Allergy    Arthritis    Bipolar affective disorder (HCC)    Colon polyp    COPD (chronic obstructive pulmonary disease) (HCC)    Depression    GERD (gastroesophageal reflux disease)    Hemorrhagic shock (HCC) 02/16/2023   Hemorrhoids    Hypercholesterolemia    Mixed hyperlipidemia    Neuromuscular disorder (HCC)    PTSD (post-traumatic stress disorder)    Right wrist fracture 03/25/2015   Sleep apnea    Type 2 diabetes mellitus without complication,  without long-term current use of insulin (HCC)      Family History  Problem Relation Age of Onset   Heart disease Father 20   Heart attack Maternal Grandmother    Heart disease Paternal Grandfather      Past Surgical History:  Procedure Laterality Date   CATARACT EXTRACTION W/ INTRAOCULAR LENS IMPLANT Right    COLONOSCOPY WITH PROPOFOL N/A 01/18/2023   Procedure: COLONOSCOPY WITH PROPOFOL;  Surgeon: Wyline Mood, MD;  Location: [facility] ENDOSCOPY;  Service: Gastroenterology;  Laterality: N/A;   HEMORRHOID SURGERY N/A 02/10/2023   Procedure: HEMORRHOIDECTOMY;  Surgeon: Campbell Lerner, MD;  Location: [facility] ORS;  Service: General;  Laterality: N/A;  surgifoam placed   HEMOSTASIS CLIP PLACEMENT  01/18/2023   Procedure: HEMOSTASIS CLIP PLACEMENT;  Surgeon: Wyline Mood, MD;  Location: [facility] ENDOSCOPY;  Service: Gastroenterology;;   MINOR FULGERATION OF ANAL CONDYLOMA N/A 02/10/2023   Procedure: MINOR FULGERATION OF ANAL CONDYLOMA;  Surgeon: Campbell Lerner, MD;  Location: [facility] ORS;  Service: General;  Laterality: N/A;   POLYPECTOMY  01/18/2023   Procedure: POLYPECTOMY;  Surgeon: Wyline Mood, MD;  Location[facility] END[facility]COPY;  Service: Gastroenterology;;   RECTAL EXAM UNDER ANESTHESIA N/A 02/10/2023   Procedure: RECTAL EXAM UNDER ANESTHESIA;  Surgeon: Campbell Lerner, MD;  Location: [facility] ORS;  Service: General;  Laterality: N/A;   RECTAL EXAM UNDER ANESTHESIA N/A 02/16/2023   Procedure: RECTAL EXAM UNDER ANESTHESIA;  Surgeon: Leafy Ro, MD;  Location: [facility] ORS;  Service: General;  Laterality: N/A;   WRIST FRACTURE SURGERY Right 04/11/2015   XI ROBOTIC LAPAROSCOPIC ASSISTED APPENDECTOMY N/A 08/24/2019   Procedure: XI ROBOTIC LAPAROSCOPIC ASSISTED APPENDECTOMY;  Surgeon: Campbell Lerner, MD;  Location: [facility] ORS;  Service: General;  Laterality: N/A;    Social History   Socioeconomic History   Marital status: Single    Spouse name: Not on file   Number of children: 1   Years of education: Not on  file   Highest education level: Not on file  Occupational History   Not on file  Tobacco Use   Smoking status: Every Day    Current packs/day: 1.00    Average packs/day: 1 pack/day for 51.2 years (51.2 ttl pk-yrs)    Types: Cigarettes    Start date: 56   Smokeless tobacco: Former    Types: Chew   Tobacco comments:    0.5PPD  Vaping  Use   Vaping status: Former  Substance and Sexual Activity   Alcohol use: Not Currently   Drug use: Not Currently    Types: Marijuana    Comment: last  used 2-3 weeks ago   Sexual activity: Not on file  Other Topics Concern   Not on file  Social History Narrative   Lives alone   Social Drivers of Health   Financial Resource Strain: Not on file  Food Insecurity: No Food Insecurity (02/16/2023)   Hunger Vital Sign    Worried About Running Out of Food in the Last Year: Never true    Ran Out of Food in the Last Year: Never true  Transportation Needs: No Transportation Needs (02/16/2023)   PRAPARE - Administrator, Civil Service (Medical): No    Lack of Transportation (Non-Medical): No  Physical Activity: Not on file  Stress: Not on file  Social Connections: Not on file  Intimate Partner Violence: Not At Risk (02/16/2023)   Humiliation, Afraid, Rape, and Kick questionnaire    Fear of Current or Ex-Partner: No    Emotionally Abused: No    Physically Abused: No    Sexually Abused: No     Allergies  Allergen Reactions   Codeine Nausea And Vomiting, Other (See Comments) and Nausea Only   Metformin Other (See Comments)    unsure     CBC    Component Value Date/Time   WBC 12.0 (H) 02/19/2023 1405   WBC 15.2 (H) 02/18/2023 0421   RBC 2.84 (L) 02/19/2023 1405   RBC 2.64 (L) 02/18/2023 0421   HGB 8.6 (L) 02/19/2023 1405   HCT 26.2 (L) 02/19/2023 1405   PLT 363 02/19/2023 1405   MCV 92 02/19/2023 1405   MCH 30.3 02/19/2023 1405   MCH 30.3 02/18/2023 0421   MCHC 32.8 02/19/2023 1405   MCHC 34.0 02/18/2023 0421   RDW 14.3  02/19/2023 1405   LYMPHSABS 3.6 (H) 02/19/2023 1405   MONOABS 0.9 02/16/2023 1759   EOSABS 0.2 02/19/2023 1405   BASOSABS 0.1 02/19/2023 1405    Pulmonary Functions Testing Results:     No data to display          Outpatient Medications Prior to Visit  Medication Sig Dispense Refill   Accu-Chek Softclix Lancets lancets Use as instructed to check blood glucose daily. Dx E11.9 100 each 12   albuterol (VENTOLIN HFA) 108 (90 Base) MCG/ACT inhaler Inhale 2 puffs into the lungs every 6 (six) hours as needed for wheezing or shortness of breath. 6.7 g 5   aspirin 81 MG EC tablet Take 81 mg by mouth daily.     B Complex-C (B-COMPLEX WITH VITAMIN C) tablet Take 1 tablet by mouth daily.     Blood Glucose Monitoring Suppl (ONE TOUCH ULTRA 2) w/Device KIT Use to check blood sugar daily. Dx E11.9 1 kit 0   cariprazine (VRAYLAR) 3 MG capsule Take 1 capsule (3 mg total) by mouth daily. 30 capsule 0   cetirizine (ZYRTEC) 10 MG chewable tablet Chew 1 tablet (10 mg total) by mouth daily. 90 tablet 1   cetirizine (ZYRTEC) 10 MG tablet Take 1 tablet (10 mg total) by mouth daily. 30 tablet 11   Cholecalciferol (VITAMIN D3) 50 MCG (2000 UT) capsule Take 1 capsule (2,000 Units total) by mouth daily. 90 capsule 3   escitalopram (LEXAPRO) 20 MG tablet Take 20 mg by mouth daily.     fluticasone (FLONASE) 50 MCG/ACT nasal spray Place 2 sprays  into both nostrils daily. sometimes uses in the evening as needed 16 g 5   glucose blood (ONETOUCH ULTRA TEST) test strip Use as instructed to check blood glucose daily. Dx E11.9 100 each 12   ibuprofen (ADVIL) 800 MG tablet Take 1 tablet (800 mg total) by mouth every 8 (eight) hours as needed. 30 tablet 0   metoprolol succinate (TOPROL-XL) 25 MG 24 hr tablet Take 1 tablet (25 mg total) by mouth daily. 30 tablet 5   omeprazole (PRILOSEC) 40 MG capsule Take 1 capsule (40 mg total) by mouth daily. 30 capsule 5   prazosin (MINIPRESS) 1 MG capsule Take 2 mg by mouth at bedtime.      rosuvastatin (CRESTOR) 20 MG tablet Take 1 tablet (20 mg total) by mouth daily. 90 tablet 1   Tiotropium Bromide-Olodaterol (STIOLTO RESPIMAT) 2.5-2.5 MCG/ACT AERS Inhale 2 puffs into the lungs daily. 60 each 5   trazodone (DESYREL) 300 MG tablet Take 300 mg by mouth at bedtime.     ARIPiprazole (ABILIFY) 10 MG tablet Take 1 tablet (10 mg total) by mouth daily. 30 tablet 0   No facility-administered medications prior to visit.

## 2023-04-20 ENCOUNTER — Ambulatory Visit: Payer: MEDICAID | Attending: Internal Medicine

## 2023-04-20 DIAGNOSIS — R0602 Shortness of breath: Secondary | ICD-10-CM

## 2023-04-20 LAB — ECHOCARDIOGRAM COMPLETE
AV Mean grad: 4 mmHg
AV Peak grad: 7.2 mmHg
Ao pk vel: 1.34 m/s
Area-P 1/2: 3.99 cm2
S' Lateral: 2.1 cm

## 2023-04-23 ENCOUNTER — Ambulatory Visit: Payer: MEDICAID | Admitting: Physician Assistant

## 2023-04-23 NOTE — Progress Notes (Deleted)
 Established patient visit  Patient: Victor Alvarez   DOB: 12-Aug-1960   63 y.o. Male  MRN: [id number] Visit Date: 04/23/2023  Today's healthcare provider: Debera Lat, PA-C   No chief complaint on file.  Subjective       Discussed the use of AI scribe software for clinical note transcription with the patient, who gave verbal consent to proceed.  History of Present Illness        03/11/2023    8:56 AM 02/19/2023    1:10 PM 01/08/2023    9:49 AM  Depression screen PHQ 2/9  Decreased Interest 1 2 1   Down, Depressed, Hopeless 1 2 1   PHQ - 2 Score 2 4 2   Altered sleeping 3 2 1   Tired, decreased energy 3 2 2   Change in appetite 1 2 2   Feeling bad or failure about yourself  1 3 3   Trouble concentrating 1 2 1   Moving slowly or fidgety/restless 0 1 2  Suicidal thoughts 0 0 1  PHQ-9 Score 11 16 14   Difficult doing work/chores Somewhat difficult Somewhat difficult Somewhat difficult      03/11/2023    8:56 AM 02/19/2023    1:10 PM 01/08/2023    9:49 AM 12/10/2022    1:56 PM  GAD 7 : Generalized Anxiety Score  Nervous, Anxious, on Edge 2 3 2 3   Control/stop worrying 2 2 2 2   Worry too much - different things 2 3 2 2   Trouble relaxing 2 3 2 2   Restless 1 2 2 3   Easily annoyed or irritable 1 2 1  0  Afraid - awful might happen 3 3 3 2   Total GAD 7 Score 13 18 14 14   Anxiety Difficulty Somewhat difficult Somewhat difficult Somewhat difficult Somewhat difficult    Medications: Outpatient Medications Prior to Visit  Medication Sig  . Accu-Chek Softclix Lancets lancets Use as instructed to check blood glucose daily. Dx E11.9  . albuterol (VENTOLIN HFA) 108 (90 Base) MCG/ACT inhaler Inhale 2 puffs into the lungs every 6 (six) hours as needed for wheezing or shortness of breath.  . ARIPiprazole (ABILIFY) 10 MG tablet Take 1 tablet (10 mg total) by mouth daily.  Marland Kitchen aspirin 81 MG EC tablet Take 81 mg by mouth daily.  . B Complex-C (B-COMPLEX WITH VITAMIN C) tablet Take 1 tablet by mouth  daily.  . Blood Glucose Monitoring Suppl (ONE TOUCH ULTRA 2) w/Device KIT Use to check blood sugar daily. Dx E11.9  . cariprazine (VRAYLAR) 3 MG capsule Take 1 capsule (3 mg total) by mouth daily.  . cetirizine (ZYRTEC) 10 MG chewable tablet Chew 1 tablet (10 mg total) by mouth daily.  . cetirizine (ZYRTEC) 10 MG tablet Take 1 tablet (10 mg total) by mouth daily.  . Cholecalciferol (VITAMIN D3) 50 MCG (2000 UT) capsule Take 1 capsule (2,000 Units total) by mouth daily.  Marland Kitchen escitalopram (LEXAPRO) 20 MG tablet Take 20 mg by mouth daily.  . fluticasone (FLONASE) 50 MCG/ACT nasal spray Place 2 sprays into both nostrils daily. sometimes uses in the evening as needed  . glucose blood (ONETOUCH ULTRA TEST) test strip Use as instructed to check blood glucose daily. Dx E11.9  . ibuprofen (ADVIL) 800 MG tablet Take 1 tablet (800 mg total) by mouth every 8 (eight) hours as needed.  . metoprolol succinate (TOPROL-XL) 25 MG 24 hr tablet Take 1 tablet (25 mg total) by mouth daily.  Marland Kitchen omeprazole (PRILOSEC) 40 MG capsule Take 1 capsule (40 mg total) by mouth  daily.  . prazosin (MINIPRESS) 1 MG capsule Take 2 mg by mouth at bedtime.  . rosuvastatin (CRESTOR) 20 MG tablet Take 1 tablet (20 mg total) by mouth daily.  . Tiotropium Bromide-Olodaterol (STIOLTO RESPIMAT) 2.5-2.5 MCG/ACT AERS Inhale 2 puffs into the lungs daily.  . trazodone (DESYREL) 300 MG tablet Take 300 mg by mouth at bedtime.   No facility-administered medications prior to visit.    Review of Systems  All other systems reviewed and are negative. All negative Except see HPI   {Insert previous labs (optional):23779} {See past labs  Heme  Chem  Endocrine  Serology  Results Review (optional):1}   Objective    There were no vitals taken for this visit. {Insert last BP/Wt (optional):23777}{See vitals history (optional):1}   Physical Exam Vitals reviewed.  Constitutional:      General: He is not in acute distress.    Appearance:  Normal appearance. He is not diaphoretic.  HENT:     Head: Normocephalic and atraumatic.  Eyes:     General: No scleral icterus.    Conjunctiva/sclera: Conjunctivae normal.  Cardiovascular:     Rate and Rhythm: Normal rate and regular rhythm.     Pulses: Normal pulses.     Heart sounds: Normal heart sounds. No murmur heard. Pulmonary:     Effort: Pulmonary effort is normal. No respiratory distress.     Breath sounds: Normal breath sounds. No wheezing or rhonchi.  Musculoskeletal:     Cervical back: Neck supple.     Right lower leg: No edema.     Left lower leg: No edema.  Lymphadenopathy:     Cervical: No cervical adenopathy.  Skin:    General: Skin is warm and dry.     Findings: No rash.  Neurological:     Mental Status: He is alert and oriented to person, place, and time. Mental status is at baseline.  Psychiatric:        Mood and Affect: Mood normal.        Behavior: Behavior normal.     No results found for any visits on 04/23/23.      Assessment and Plan Assessment & Plan     No orders of the defined types were placed in this encounter.   No follow-ups on file.   The patient was advised to call back or seek an in-person evaluation if the symptoms worsen or if the condition fails to improve as anticipated.  I discussed the assessment and treatment plan with the patient. The patient was provided an opportunity to ask questions and all were answered. The patient agreed with the plan and demonstrated an understanding of the instructions.  I, Debera Lat, PA-C have reviewed all documentation for this visit. The documentation on 04/23/2023  for the exam, diagnosis, procedures, and orders are all accurate and complete.  Debera Lat, [facility], MMS [facility] [phone] (phone) [phone] (fax)  [facility]

## 2023-04-27 ENCOUNTER — Ambulatory Visit (INDEPENDENT_AMBULATORY_CARE_PROVIDER_SITE_OTHER): Payer: MEDICAID | Admitting: Surgery

## 2023-04-27 ENCOUNTER — Encounter: Payer: Self-pay | Admitting: Surgery

## 2023-04-27 VITALS — BP 127/81 | HR 120 | Temp 97.8°F | Ht 70.0 in | Wt 228.6 lb

## 2023-04-27 DIAGNOSIS — Z8719 Personal history of other diseases of the digestive system: Secondary | ICD-10-CM | POA: Insufficient documentation

## 2023-04-27 DIAGNOSIS — R85613 High grade squamous intraepithelial lesion on cytologic smear of anus (HGSIL): Secondary | ICD-10-CM

## 2023-04-27 DIAGNOSIS — Z9889 Other specified postprocedural states: Secondary | ICD-10-CM

## 2023-04-27 NOTE — Progress Notes (Signed)
 [facility] POST-OP OFFICE VISIT  04/27/2023  HPI: Victor Alvarez is a 63 y.o. male had surgery on  January 8, and 14th, 2025, now s/p hemorrhoidectomy with pathology below to be reviewed.  We discussed this in detail including the plan for follow-up and treatment options.  As for now we will give him further opportunity to progress in his healing.  He denies any bleeding, or pain.  Denies any fevers and chills.  1. 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    Vital signs: BP 127/81   Pulse (!) 120   Temp 97.8 F (36.6 C) (Oral)   Ht 5\' 10"  (1.778 m)   Wt 228 lb 9.6 oz (103.7 kg)   SpO2 94%   BMI 32.80 kg/m    Physical Exam: Constitutional: He appears stable, alert, no distress. DRE:  Marylene Land present.  No extra-anal lesions, and no appreciable palpable lesions on exam today.  There is a residual hemorrhoidal pile grade 2, on pt's mid right.    Assessment/Plan: This is a 63 y.o. male post procedure, and f/u control of hemorrhage January 8, and 14th, 2025, now s/p hemorrhoidectomy with pathology:   1. 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) 02/25/2023   PTSD (post-traumatic stress disorder) 02/19/2023   Anemia 02/19/2023   Obesity (BMI 30-39.9) 02/19/2023   Rectal bleeding 02/16/2023   Anal condylomata 02/10/2023   Sessile rectal polyp 02/10/2023   Grade II hemorrhoids 01/20/2023   Encounter for screening colonoscopy 01/18/2023   Adenomatous polyp of colon 01/18/2023   Depression, recurrent (HCC) 01/09/2023   Smoking 12/11/2022   Polypharmacy 12/11/2022    Closed fracture of left wrist 06/03/2022   Acute prostatitis 04/21/2022   Tremor 04/06/2022   Erythrocytosis 02/03/2022   Syncope 08/16/2021   Bilateral tinnitus 08/08/2021   Bereavement 08/08/2021   Vertigo 08/08/2021   Low back pain 07/04/2021   Spasm of back muscles 07/04/2021   History of alcoholism (HCC) 04/30/2021   Gastritis 02/06/2021   Chronic obstructive pulmonary disease (HCC) 09/24/2020   Insomnia 09/24/2020   Anxiety 09/24/2020   Allergic rhinitis 09/24/2020   Gastroesophageal reflux disease 09/24/2020   Lethargy 09/24/2020   Hypercholesterolemia 09/24/2020   Nausea 09/24/2020   Obstructive sleep apnea syndrome 09/24/2020   Snoring 09/24/2020   Type 2 diabetes mellitus (HCC) 09/24/2020   Vitamin D deficiency 09/24/2020   Depressive disorder 09/24/2020   Panic attack 09/24/2020   History of appendectomy 08/07/2019   Mixed hyperlipidemia 01/18/2017   Type 2 diabetes mellitus without complication, without long-term current use of insulin (HCC) 01/18/2017   Cubital tunnel syndrome on right 12/11/2016   Obesity 06/26/2016   Primary osteoarthritis of first carpometacarpal joint of right hand 04/29/2016   Essential tremor 12/18/2015   Corneal scar, right eye 10/24/2015   Status post cataract extraction and insertion of intraocular lens of right eye 10/24/2015   Nuclear sclerosis, right 10/23/2015   Fracture of scaphoid bone of wrist 03/25/2015   Ulnar abutment syndrome of right wrist 03/25/2015   Bipolar  affective disorder, currently depressed, moderate (HCC) 07/20/2014   Tobacco use 06/29/2014   Cortical senile cataract of right eye 06/15/2014   Recurrent bronchospasm 06/15/2014   Posttraumatic stress disorder 01/20/2014   -Will have him follow-up in 6 months, or as needed for clinical examination, and further discussion of treatments as noted below.   - refer for HPV vaccine, and consideration of other topical prophylactic treatment/surveillance.   -  Treatment Options Discussed/reviewed.   Condylomata (Genital Warts): Topical Treatments: Imiquimod, podophyllotoxin, or sinecatechins. Cryotherapy: Freezing the warts with liquid nitrogen. Surgical Removal: Excision, laser therapy, or electrocautery.  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 Surgical Options: High-resolution anoscopy (HRA) guided excision or ablation. Laser Therapy: CO2 laser ablation. Immunotherapy: HPV vaccines may help in preventing recurrence.  Hemorrhoids: Lifestyle Changes: High-fiber diet, increased water intake, and regular exercise. Topical Treatments: Over-the-counter creams and ointments. Minimally Invasive Procedures: Rubber band ligation, sclerotherapy, or infrared coagulation. Surgery: Hemorrhoidectomy or stapled hemorrhoidopexy for severe cases.  Surveillance Regular Follow-ups: Periodic high-resolution anoscopy (HRA) to monitor HSIL/AIN-2. HPV Testing: Regular HPV testing to check for high-risk HPV strains. Self-Examination: Regular self-examination for any new or recurring lesions. Symptom Monitoring: Keep track of any symptoms like bleeding, itching, or pain and report them to your healthcare provider.   Campbell Lerner M.D., FACS 04/27/2023, 1:36 PM

## 2023-04-27 NOTE — Patient Instructions (Signed)
Human Papillomavirus (HPV) Vaccine Injection What is this medication? HUMAN PAPILLOMAVIRUS VACCINE (HYOO muhn pap uh LOH muh vahy ruhs vak SEEN) reduces the risk of human papillomavirus (HPV). It does not treat HPV. It is still possible to get HPV after receiving this vaccine, but the symptoms may be less severe or not last as long. It works by helping your immune system learn how to fight off a future infection. This medicine may be used for other purposes; ask your health care provider or pharmacist if you have questions. COMMON BRAND NAME(S): Gardasil 9 What should I tell my care team before I take this medication? They need to know if you have any of these conditions: Fever Hemophilia HIV or AIDS Immune system problems Infection Low platelets An unusual reaction to human papillomavirus vaccine, yeast, other vaccines, other medications, foods, dyes, or preservatives Pregnant or trying to get pregnant Breastfeeding How should I use this medication? This vaccine is injected into a muscle. It is given by your care team. This vaccine requires 2 or 3 doses to get the full benefit. Set a reminder for when your next dose is due. A copy of the Vaccine Information Statement will be given before each vaccination. Be sure to read this information carefully each time. This sheet may change often. Talk to your care team about the use of this medication in children. While it may be prescribed for children as young as 9 years for selected conditions, precautions do apply. Overdosage: If you think you have taken too much of this medicine contact a poison control center or emergency room at once. NOTE: This medicine is only for you. Do not share this medicine with others. What if I miss a dose? Keep appointments for follow-up doses as directed. It is important not to miss your dose. Call your care team if you are unable to keep an appointment. What may interact with this medication? Certain medications  for arthritis Medications for organ transplant Medications to treat cancer Steroid medications, such as prednisone or cortisone This list may not describe all possible interactions. Give your health care provider a list of all the medicines, herbs, non-prescription drugs, or dietary supplements you use. Also tell them if you smoke, drink alcohol, or use illegal drugs. Some items may interact with your medicine. What should I watch for while using this medication? Visit your care team regularly. Report any side effects to your care team right away. This vaccine, like all vaccines, may not fully protect everyone. What side effects may I notice from receiving this medication? Side effects that you should report to your care team as soon as possible: Allergic reactions--skin rash, itching, hives, swelling of the face, lips, tongue, or throat Feeling faint or lightheaded Side effects that usually do not require medical attention (report these to your care team if they continue or are bothersome): Diarrhea Dizziness Fatigue Fever Headache Nausea Pain, redness, irritation, or bruising at the injection site This list may not describe all possible side effects. Call your doctor for medical advice about side effects. You may report side effects to FDA at 1-800-FDA-1088. Where should I keep my medication? This vaccine is only given by your care team. It will not be stored at home. NOTE: This sheet is a summary. It may not cover all possible information. If you have questions about this medicine, talk to your doctor, pharmacist, or health care provider.  2024 Elsevier/Gold Standard (2021-07-02 00:00:00)

## 2023-04-28 NOTE — Progress Notes (Signed)
 " Established patient visit  Patient: Victor Alvarez   DOB: February 26, 1960   63 y.o. Male  MRN: [id number] Visit Date: 04/29/2023  Today's healthcare provider: Jolynn Spencer, PA-C   Chief Complaint  Patient presents with   Follow-up    Pt does not remember reason for visit but per chart is following up on chronic bronchitis but has no concerns    Subjective      Discussed the use of AI scribe software for clinical note transcription with the patient, who gave verbal consent to proceed.  History of Present Illness Victor Alvarez, a patient with a history of chronic bronchitis, presents for a follow-up visit. He reports no current respiratory symptoms and appears to have recovered from his previous bronchitis episode. He expresses interest in updating his vaccines, particularly the HPV vaccine, following a recent surgery. He also reports memory problems, which have been present for a while. He admits to smoking about ten cigarettes a day, which he uses to calm his nerves. He has been trying to improve his diet by reducing his intake of sweet drinks and has been exercising regularly. However, he reports no weight loss. He also mentions that he has been taking his prescribed medications regularly, but is unsure about some of them. He has been seeing a behavioral health specialist once a month and is satisfied with the care he is receiving there.       04/29/2023    9:38 AM 03/11/2023    8:56 AM 02/19/2023    1:10 PM  Depression screen PHQ 2/9  Decreased Interest 1 1 2   Down, Depressed, Hopeless 1 1 2   PHQ - 2 Score 2 2 4   Altered sleeping 1 3 2   Tired, decreased energy 1 3 2   Change in appetite 0 1 2  Feeling bad or failure about yourself  1 1 3   Trouble concentrating 1 1 2   Moving slowly or fidgety/restless 0 0 1  Suicidal thoughts 0 0 0  PHQ-9 Score 6 11 16   Difficult doing work/chores Somewhat difficult Somewhat difficult Somewhat difficult      04/29/2023    9:38 AM 03/11/2023    8:56 AM  02/19/2023    1:10 PM 01/08/2023    9:49 AM  GAD 7 : Generalized Anxiety Score  Nervous, Anxious, on Edge 1 2 3 2   Control/stop worrying 1 2 2 2   Worry too much - different things 1 2 3 2   Trouble relaxing 1 2 3 2   Restless 2 1 2 2   Easily annoyed or irritable 1 1 2 1   Afraid - awful might happen 1 3 3 3   Total GAD 7 Score 8 13 18 14   Anxiety Difficulty Somewhat difficult Somewhat difficult Somewhat difficult Somewhat difficult    Medications: Outpatient Medications Prior to Visit  Medication Sig   Accu-Chek Softclix Lancets lancets Use as instructed to check blood glucose daily. Dx E11.9   albuterol  (VENTOLIN  HFA) 108 (90 Base) MCG/ACT inhaler Inhale 2 puffs into the lungs every 6 (six) hours as needed for wheezing or shortness of breath.   aspirin  81 MG EC tablet Take 81 mg by mouth daily.   B Complex-C (B-COMPLEX WITH VITAMIN C) tablet Take 1 tablet by mouth daily.   Blood Glucose Monitoring Suppl (ONE TOUCH ULTRA 2) w/Device KIT Use to check blood sugar daily. Dx E11.9   cariprazine  (VRAYLAR ) 3 MG capsule Take 1 capsule (3 mg total) by mouth daily.   cetirizine  (ZYRTEC ) 10 MG chewable tablet  Chew 1 tablet (10 mg total) by mouth daily.   cetirizine  (ZYRTEC ) 10 MG tablet Take 1 tablet (10 mg total) by mouth daily.   Cholecalciferol (VITAMIN D3) 50 MCG (2000 UT) capsule Take 1 capsule (2,000 Units total) by mouth daily.   escitalopram  (LEXAPRO ) 20 MG tablet Take 20 mg by mouth daily.   fluticasone  (FLONASE ) 50 MCG/ACT nasal spray Place 2 sprays into both nostrils daily. sometimes uses in the evening as needed   glucose blood (ONETOUCH ULTRA TEST) test strip Use as instructed to check blood glucose daily. Dx E11.9   ibuprofen  (ADVIL ) 800 MG tablet Take 1 tablet (800 mg total) by mouth every 8 (eight) hours as needed.   metoprolol  succinate (TOPROL -XL) 25 MG 24 hr tablet Take 1 tablet (25 mg total) by mouth daily.   omeprazole  (PRILOSEC) 40 MG capsule Take 1 capsule (40 mg total) by  mouth daily.   prazosin  (MINIPRESS ) 1 MG capsule Take 2 mg by mouth at bedtime.   rosuvastatin  (CRESTOR ) 20 MG tablet Take 1 tablet (20 mg total) by mouth daily.   Tiotropium Bromide-Olodaterol (STIOLTO RESPIMAT ) 2.5-2.5 MCG/ACT AERS Inhale 2 puffs into the lungs daily.   trazodone  (DESYREL ) 300 MG tablet Take 300 mg by mouth at bedtime.   No facility-administered medications prior to visit.    Review of Systems  All other systems reviewed and are negative.  All negative Except see HPI       Objective    BP 127/77   Pulse 81   Temp (!) 97.5 F (36.4 C)   Ht 5' 10 (1.778 m)   Wt 234 lb 8 oz (106.4 kg)   SpO2 98%   BMI 33.65 kg/m     Physical Exam Vitals reviewed.  Constitutional:      General: He is not in acute distress.    Appearance: Normal appearance. He is not diaphoretic.  HENT:     Head: Normocephalic and atraumatic.  Eyes:     General: No scleral icterus.    Conjunctiva/sclera: Conjunctivae normal.  Cardiovascular:     Rate and Rhythm: Normal rate and regular rhythm.     Pulses: Normal pulses.     Heart sounds: Normal heart sounds. No murmur heard. Pulmonary:     Effort: Pulmonary effort is normal. No respiratory distress.     Breath sounds: Normal breath sounds. No wheezing or rhonchi.  Musculoskeletal:     Cervical back: Neck supple.     Right lower leg: No edema.     Left lower leg: No edema.  Lymphadenopathy:     Cervical: No cervical adenopathy.  Skin:    General: Skin is warm and dry.     Findings: No rash.  Neurological:     Mental Status: He is alert and oriented to person, place, and time. Mental status is at baseline.  Psychiatric:        Mood and Affect: Mood normal.        Behavior: Behavior normal.      No results found for any visits on 04/29/23.      Assessment & Plan Genital Warts (Condyloma Acuminata) S/p hemorrhoidectomy. Discussed potential for infection to worsen if untreated and need for regular HPV testing. - Refer  to dermatology for management and supervision of topical imiquimod treatment if pt agreed to proceed - Discuss HPV vaccination with vaccination center to determine appropriateness and insurance coverage Contact social worker and pharmacist about HPV coverage Referral to GI was advised for assessment /  management  Pt was scheduled for an appointment with oncology on 06/02/23. Due to high recurrence rate of HSIL/AIN-2, condylomata, advised ongoing surveillance.   Hemorrhoids/HSIL/AIN-2/HPV testing/Genital warts Was seen by surgery and -Was advised lifestyle modifications, topical treatments and minimally invasive procedures for hemorrhoids. -Advised imiquimod, 5-fluorouracil, excision or ablation vs laser ablation or HP vaccines in preventing recurrence for HSIL/AIN-2 -Topical treatment with imiquimod, cryotherapy and surgical removal was advised for condylomata. - Surveillance with regular follow-up, hpv testing, self-examination, symptom monitoring. HPV vaccine was advised for disease progression.  Follow-up with Surgery  Smoking Cessation chronic Smokes 10 cigarettes daily, contributing to shortness of breath. Discussed link between smoking and respiratory issues and cessation aids. - Discuss smoking cessation options, including medications, gums, or patches, with him and behavioral health.  Memory Concerns Reports memory issues, potentially related to medications. Behavioral health involved. Discussed possible medication adjustments and neurology referral if issues persist. - Evaluate memory concerns at next visit and consider referral to neurology if necessary.  Diabetes Mellitus chronic Aware of diabetes diagnosis but unsure of current medication regimen. Last A1c check needs review. - Review diabetes management and medication regimen at next visit. Per pt he is taking all medication on the list  General Health Maintenance Due for vaccinations and foot exam. Interested in updating  shingles and pneumonia vaccines. - Administer pneumonia vaccine today. - Schedule shingles vaccine. - Discuss HPV vaccine with vaccination center to determine appropriateness. - Schedule foot exam.  Follow-up Follow-up required to address ongoing health issues and ensure proper management. Discussed importance of bringing all medications to next visit for review. - Schedule follow-up appointment in one month to review medication list and address memory concerns. - Ensure he brings all medications to next visit for review. - Coordinate with dermatology for urgent appointment regarding genital warts treatment.    Orders Placed This Encounter  Procedures   Pneumococcal conjugate vaccine 20-valent   Ambulatory referral to Dermatology    Referral Priority:   Urgent    Referral Type:   Consultation    Referral Reason:   Specialty Services Required    Requested Specialty:   Dermatology    Number of Visits Requested:   1    Return in about 4 weeks (around 05/27/2023) for chronic disease f/u.   The patient was advised to call back or seek an in-person evaluation if the symptoms worsen or if the condition fails to improve as anticipated.  I discussed the assessment and treatment plan with the patient. The patient was provided an opportunity to ask questions and all were answered. The patient agreed with the plan and demonstrated an understanding of the instructions.  I, Akari Defelice, PA-C have reviewed all documentation for this visit. The documentation on 04/29/2023  for the exam, diagnosis, procedures, and orders are all accurate and complete. I spent 30 minutes dedicated to the care of this patient on the date of this encounter to include pre-visit review of records, face-to-face with the patient discussing condition/treatment for hemorrhoids, condylomata, hpv, NSIL/AIN-2, and post visit ordering of testing, communicating with gi and child psychotherapist, supervising physician for HPV immunization.   Jolynn Spencer, [facility], MMS [facility] [phone] (phone) [phone] (fax)  [facility] "

## 2023-04-29 ENCOUNTER — Ambulatory Visit (INDEPENDENT_AMBULATORY_CARE_PROVIDER_SITE_OTHER): Payer: MEDICAID | Admitting: Physician Assistant

## 2023-04-29 ENCOUNTER — Encounter: Payer: Self-pay | Admitting: Physician Assistant

## 2023-04-29 VITALS — BP 127/77 | HR 81 | Temp 97.5°F | Ht 70.0 in | Wt 234.5 lb

## 2023-04-29 DIAGNOSIS — J42 Unspecified chronic bronchitis: Secondary | ICD-10-CM | POA: Diagnosis not present

## 2023-04-29 DIAGNOSIS — K648 Other hemorrhoids: Secondary | ICD-10-CM | POA: Diagnosis not present

## 2023-04-29 DIAGNOSIS — Z23 Encounter for immunization: Secondary | ICD-10-CM | POA: Diagnosis not present

## 2023-04-29 DIAGNOSIS — R413 Other amnesia: Secondary | ICD-10-CM | POA: Diagnosis not present

## 2023-04-29 DIAGNOSIS — F17200 Nicotine dependence, unspecified, uncomplicated: Secondary | ICD-10-CM

## 2023-04-29 DIAGNOSIS — A630 Anogenital (venereal) warts: Secondary | ICD-10-CM

## 2023-04-29 DIAGNOSIS — K6282 Dysplasia of anus: Secondary | ICD-10-CM | POA: Diagnosis not present

## 2023-05-03 ENCOUNTER — Encounter: Payer: Self-pay | Admitting: Student in an Organized Health Care Education/Training Program

## 2023-05-04 DIAGNOSIS — K648 Other hemorrhoids: Secondary | ICD-10-CM | POA: Insufficient documentation

## 2023-05-04 DIAGNOSIS — G4733 Obstructive sleep apnea (adult) (pediatric): Secondary | ICD-10-CM

## 2023-05-04 DIAGNOSIS — R413 Other amnesia: Secondary | ICD-10-CM | POA: Insufficient documentation

## 2023-05-04 DIAGNOSIS — F17200 Nicotine dependence, unspecified, uncomplicated: Secondary | ICD-10-CM | POA: Insufficient documentation

## 2023-05-04 DIAGNOSIS — K6282 Dysplasia of anus: Secondary | ICD-10-CM | POA: Insufficient documentation

## 2023-05-04 DIAGNOSIS — A630 Anogenital (venereal) warts: Secondary | ICD-10-CM | POA: Insufficient documentation

## 2023-05-05 ENCOUNTER — Other Ambulatory Visit: Payer: MEDICAID

## 2023-05-05 NOTE — Patient Instructions (Signed)
Thank you for allowing the Care Management team to participate in your care.

## 2023-05-05 NOTE — Patient Outreach (Unsigned)
 Care Management   Visit Note  05/05/2023 Name: Victor Alvarez MRN: [id number] DOB: 1960/08/12  Subjective: Victor Alvarez is a 63 y.o. year old male who is a primary care patient of Debera Lat, [place]. Engaged with Victor Alvarez via telephone today.  Assessment:  Review of patient past medical history, allergies, medications, health status, including review of consultants reports, laboratory and other test data, was performed as part of  evaluation and provision of care management services.   Outpatient Encounter Medications as of 05/05/2023  Medication Sig Note   Accu-Chek Softclix Lancets lancets Use as instructed to check blood glucose daily. Dx E11.9    albuterol (VENTOLIN HFA) 108 (90 Base) MCG/ACT inhaler Inhale 2 puffs into the lungs every 6 (six) hours as needed for wheezing or shortness of breath.    aspirin 81 MG EC tablet Take 81 mg by mouth daily.    B Complex-C (B-COMPLEX WITH VITAMIN C) tablet Take 1 tablet by mouth daily.    Blood Glucose Monitoring Suppl (ONE TOUCH ULTRA 2) w/Device KIT Use to check blood sugar daily. Dx E11.9    cariprazine (VRAYLAR) 3 MG capsule Take 1 capsule (3 mg total) by mouth daily. 03/01/2023: At bedtime   cetirizine (ZYRTEC) 10 MG chewable tablet Chew 1 tablet (10 mg total) by mouth daily.    cetirizine (ZYRTEC) 10 MG tablet Take 1 tablet (10 mg total) by mouth daily.    Cholecalciferol (VITAMIN D3) 50 MCG (2000 UT) capsule Take 1 capsule (2,000 Units total) by mouth daily.    escitalopram (LEXAPRO) 20 MG tablet Take 20 mg by mouth daily.    fluticasone (FLONASE) 50 MCG/ACT nasal spray Place 2 sprays into both nostrils daily. sometimes uses in the evening as needed    glucose blood (ONETOUCH ULTRA TEST) test strip Use as instructed to check blood glucose daily. Dx E11.9    ibuprofen (ADVIL) 800 MG tablet Take 1 tablet (800 mg total) by mouth every 8 (eight) hours as needed.    metoprolol succinate (TOPROL-XL) 25 MG 24 hr tablet Take 1 tablet (25 mg total) by  mouth daily.    omeprazole (PRILOSEC) 40 MG capsule Take 1 capsule (40 mg total) by mouth daily.    prazosin (MINIPRESS) 1 MG capsule Take 2 mg by mouth at bedtime.    rosuvastatin (CRESTOR) 20 MG tablet Take 1 tablet (20 mg total) by mouth daily.    Tiotropium Bromide-Olodaterol (STIOLTO RESPIMAT) 2.5-2.5 MCG/ACT AERS Inhale 2 puffs into the lungs daily.    trazodone (DESYREL) 300 MG tablet Take 300 mg by mouth at bedtime.    No facility-administered encounter medications on file as of 05/05/2023.     Interventions:  Goals Addressed             This Visit's Progress    Monitor, Self-Manage And Reduce Symptoms of Chronic Bronchitis       Current Barriers:  Chronic Disease Management support and education needs related to Chronic Bronchitis  Planned Interventions: Provided patient  verbal  education on Chronic Bronchitis self care/management/and exacerbation prevention Reviewed medications and importance of compliance. Reports taking medications as prescribed. Advised to call with concerns related to medication management or prescription cost. Advised patient to track and manage triggers. Reports symptoms have been well controlled. Reports not requiring oxygen therapy. Advised patient to self assesses symptoms daily and make appointment with provider if in the yellow zone for 48 hours without improvement Provided education about and advised patient to utilize infection prevention strategies to reduce  risk of respiratory infection Discussed the importance of adequate rest and management of fatigue with Chronic Bronchitis Provided information regarding safety and fall prevention measures Discussed plan following Sleep Study on April 06, 2023. He reports using a CPAP in the past but the device is currently damaged. Will contact his Pulmonology provider/Dr. Aundria Rud regarding plan. Assessed social determinant of health barriers  Symptom Management: Take medications as prescribed   Attend  all scheduled provider appointments Call pharmacy for medication refills 3-7 days in advance of running out of medications Eliminate symptom triggers at home Follow rescue plan if symptoms flare-up Call provider office for new concerns or questions           Vaccines       Current Barriers:  Care Coordination needs related to obtaining vaccines.  Planned Interventions: Message received from provider regarding assistance with obtaining vaccine. Patient reports interest in obtaining HPV vaccination. Notes he has not completed the Shingrix series and also interested in obtaining this vaccine. Notes possible changes with insurance copays and unsure where these vaccines can be obtained at low or no cost. Will follow up via conference call with insurance provider to confirm and schedule vaccines.         Interventions Today    Flowsheet Row Most Recent Value  Chronic Disease   Chronic disease during today's visit Diabetes, Chronic Bronchitis [Obstructive  Sleep Apnea]  General Interventions   General Interventions Discussed/Reviewed General Interventions Discussed, Labs, Annual Eye Exam, Annual Foot Exam, Durable Medical Equipment (DME), Vaccines, [facility], Doctor Visits  Labs Hgb A1c every 6 months  Vaccines Shingles  [HPV]  Doctor Visits Discussed/Reviewed Doctor Visits Discussed  Horticulturist, commercial (DME) --  [CPAP]  Exercise Interventions   Exercise Discussed/Reviewed Physical Activity  Physical Activity Discussed/Reviewed Physical Activity Discussed  Education Interventions   Education Provided Provided Education  Provided Verbal Education On Nutrition, Foot Care, Eye Care, Labs, Blood Sugar Monitoring, Medication, When to see the doctor, Other, Insurance Plans  [Plan following sleep study]  Labs Reviewed Hgb A1c  Mental Health Interventions   Mental Health Discussed/Reviewed Mental Health Discussed  Nutrition Interventions   Nutrition Discussed/Reviewed  Nutrition Discussed, Carbohydrate meal planning, Adding fruits and vegetables, Decreasing sugar intake, Increasing proteins, Decreasing fats, Decreasing salt  Pharmacy Interventions   Pharmacy Dicussed/Reviewed Pharmacy Topics Discussed, Medications and their functions  Safety Interventions   Safety Discussed/Reviewed Fall Risk  Advanced Directive Interventions   Advanced Directives Discussed/Reviewed Advanced Directives Reviewed  [Will send additional information via patient instructions]        PLAN Victor Alvarez agreed to follow up within the next two weeks   Juanell Fairly [facility] RN Care Manager Direct Dial: [phone]  Fax: [phone] Website: [email or web address]

## 2023-05-10 ENCOUNTER — Telehealth (INDEPENDENT_AMBULATORY_CARE_PROVIDER_SITE_OTHER): Payer: MEDICAID | Admitting: Sleep Medicine

## 2023-05-10 DIAGNOSIS — G4733 Obstructive sleep apnea (adult) (pediatric): Secondary | ICD-10-CM

## 2023-05-10 NOTE — Telephone Encounter (Signed)
 PSG uploaded, severe OSA (AHI 50, O2 nadir 79%).

## 2023-05-10 NOTE — Telephone Encounter (Signed)
 Please notify patient that PSG revealed severe OSA, recommend starting on APAP therapy set to 4-20 cm H2O. Please also schedule a 3 month PAP follow up visit. Thanks  Patient advised. DME order placed. nothing further needed.

## 2023-05-10 NOTE — Telephone Encounter (Signed)
 Lmtcb. E2C2 please advise when patient calls back.

## 2023-05-10 NOTE — Addendum Note (Signed)
 Addended by: Hyacinth Meeker on: 05/10/2023 10:40 AM   Modules accepted: Orders

## 2023-05-10 NOTE — Progress Notes (Deleted)
 Cardiology Office Note    Date:  05/10/2023   ID:  Victor Alvarez, DOB [birth year], MRN [id number]  PCP:  Debera Lat, PA-C  Cardiologist:  Yvonne Kendall, MD  Electrophysiologist:  None   Chief Complaint: Follow-up  History of Present Illness:   Victor Alvarez is a 63 y.o. male with history of CAD noted on CT imaging, aortic atherosclerosis, tracheobronchomalacia, pulmonary nodule, emphysema with ongoing tobacco use, GI bleed with hemorrhagic shock following hemorrhoidectomy in 02/2023, DM 2, bipolar disorder, and OSA not on CPAP who presents for follow-up of echo.  High-resolution chest CT in 01/2023 showed no evidence of ILD with mild emphysema, tracheobronchomalacia, left lower lobe pulmonary nodule measuring 0.5 cm, coronary artery calcification, aortic atherosclerosis, hepatic steatosis, and nonobstructive left nephrolithiasis.  He was admitted in 02/2023 after having previously undergone hemorrhoidectomy earlier in the month presenting with rectal bleeding complicated by hemorrhagic shock requiring vasopressor support with hemoglobin downtrending to 8 (baseline around 15-16).  He was seen by Dr. Okey Dupre as a new patient in 03/2023 at the request of pulmonology for evaluation of shortness of breath.  At that time he reported having dyspnea for many years, likely more than a decade that was progressively getting worse.  He reported at times shortness of breath with bending over.  He also reported occasional twinges of chest pain that would occur randomly and were not associated with exertion.  He continued to smoke and was down to 8 to 10 cigarettes/day (previously smoked 1 pack/day).  PFTs were pending.  Echo in 04/2023 showed an EF of 60 to 65%, no regional wall motion abnormalities, grade 1 diastolic dysfunction, normal RV systolic function and ventricular cavity size, no significant valvular abnormalities, and an estimated right atrial pressure of 3 mmHg.  ***   Labs independently  reviewed: 02/2023 - BUN 11, serum creatinine 1.03, potassium 3.8, Hgb 8.6, PLT 363, magnesium 1.2, albumin 2.6, AST/ALT not elevated 12/2022 - A1c 6.6, TC 173, TG 124, HDL 37, LDL 114, TSH normal  Past Medical History:  Diagnosis Date   Acute appendicitis 08/24/2019   Allergy    Arthritis    Bipolar affective disorder (HCC)    Colon polyp    COPD (chronic obstructive pulmonary disease) (HCC)    Depression    GERD (gastroesophageal reflux disease)    Hemorrhagic shock (HCC) 02/16/2023   Hemorrhoids    Hypercholesterolemia    Mixed hyperlipidemia    Neuromuscular disorder (HCC)    PTSD (post-traumatic stress disorder)    Right wrist fracture 03/25/2015   Sleep apnea    Type 2 diabetes mellitus without complication, without long-term current use of insulin (HCC)     Past Surgical History:  Procedure Laterality Date   CATARACT EXTRACTION W/ INTRAOCULAR LENS IMPLANT Right    COLONOSCOPY WITH PROPOFOL N/A 01/18/2023   Procedure: COLONOSCOPY WITH PROPOFOL;  Surgeon: Wyline Mood, MD;  Location: [facility] ENDOSCOPY;  Service: Gastroenterology;  Laterality: N/A;   HEMORRHOID SURGERY N/A 02/10/2023   Procedure: HEMORRHOIDECTOMY;  Surgeon: Campbell Lerner, MD;  Location: [facility] ORS;  Service: General;  Laterality: N/A;  surgifoam placed   HEMOSTASIS CLIP PLACEMENT  01/18/2023   Procedure: HEMOSTASIS CLIP PLACEMENT;  Surgeon: Wyline Mood, MD;  Location: [facility] ENDOSCOPY;  Service: Gastroenterology;;   MINOR FULGERATION OF ANAL CONDYLOMA N/A 02/10/2023   Procedure: MINOR FULGERATION OF ANAL CONDYLOMA;  Surgeon: Campbell Lerner, MD;  Location: [facility] ORS;  Service: General;  Laterality: N/A;   POLYPECTOMY  01/18/2023   Procedure: POLYPECTOMY;  Surgeon: Tobi Bastos,  Sharlet Salina, MD;  Location: [facility] ENDOSCOPY;  Service: Gastroenterology;;   RECTAL EXAM UNDER ANESTHESIA N/A 02/10/2023   Procedure: RECTAL EXAM UNDER ANESTHESIA;  Surgeon: Campbell Lerner, MD;  Location: [facility] ORS;  Service: General;  Laterality: N/A;    RECTAL EXAM UNDER ANESTHESIA N/A 02/16/2023   Procedure: RECTAL EXAM UNDER ANESTHESIA;  Surgeon: Leafy Ro, MD;  Location: [facility] ORS;  Service: General;  Laterality: N/A;   WRIST FRACTURE SURGERY Right 04/11/2015   XI ROBOTIC LAPAROSCOPIC ASSISTED APPENDECTOMY N/A 08/24/2019   Procedure: XI ROBOTIC LAPAROSCOPIC ASSISTED APPENDECTOMY;  Surgeon: Campbell Lerner, MD;  Location: [facility] ORS;  Service: General;  Laterality: N/A;    Current Medications: No outpatient medications have been marked as taking for the 05/11/23 encounter (Appointment) with Sondra Barges, PA-C.    Allergies:   Codeine and Metformin   Social History   Socioeconomic History   Marital status: Single    Spouse name: Not on file   Number of children: 1   Years of education: Not on file   Highest education level: Not on file  Occupational History   Not on file  Tobacco Use   Smoking status: Every Day    Current packs/day: 1.00    Average packs/day: 1 pack/day for 51.3 years (51.3 ttl pk-yrs)    Types: Cigarettes    Start date: 52   Smokeless tobacco: Former    Types: Chew   Tobacco comments:    0.5PPD  Vaping Use   Vaping status: Former  Substance and Sexual Activity   Alcohol use: Not Currently   Drug use: Not Currently    Types: Marijuana    Comment: last  used 2-3 weeks ago   Sexual activity: Not on file  Other Topics Concern   Not on file  Social History Narrative   Lives alone   Social Drivers of Health   Financial Resource Strain: Low Risk  (05/05/2023)   Overall Financial Resource Strain (CARDIA)    Difficulty of Paying Living Expenses: Not very hard  Food Insecurity: No Food Insecurity (05/05/2023)   Hunger Vital Sign    Worried About Running Out of Food in the Last Year: Never true    Ran Out of Food in the Last Year: Never true  Transportation Needs: No Transportation Needs (05/05/2023)   PRAPARE - Administrator, Civil Service (Medical): No    Lack of Transportation  (Non-Medical): No  Physical Activity: Not on file  Stress: Stress Concern Present (04/29/2023)   [employer] of Occupational Health - Occupational Stress Questionnaire    Feeling of Stress : To some extent  Social Connections: Not on file     Family History:  The patient's family history includes Heart attack in his maternal grandmother; Heart disease in his paternal grandfather; Heart disease (age of onset: 53) in his father.  ROS:   12-point review of systems is negative unless otherwise noted in the HPI.   EKGs/Labs/Other Studies Reviewed:    Studies reviewed were summarized above. The additional studies were reviewed today:  2D echo 04/20/2023: 1. Left ventricular ejection fraction, by estimation, is 60 to 65%. The  left ventricle has normal function. The left ventricle has no regional  wall motion abnormalities. Left ventricular diastolic parameters are  consistent with Grade I diastolic  dysfunction (impaired relaxation).   2. Right ventricular systolic function is normal. The right ventricular  size is normal. Tricuspid regurgitation signal is inadequate for assessing  PA pressure.   3. The  mitral valve is normal in structure. No evidence of mitral valve  regurgitation. No evidence of mitral stenosis.   4. The aortic valve is normal in structure. Aortic valve regurgitation is  not visualized. No aortic stenosis is present.   5. The inferior vena cava is normal in size with greater than 50%  respiratory variability, suggesting right atrial pressure of 3 mmHg.    EKG:  EKG is ordered today.  The EKG ordered today demonstrates ***  Recent Labs: 12/10/2022: TSH 2.460 02/16/2023: ALT 11 02/17/2023: Magnesium 1.7 02/19/2023: BUN 11; Creatinine, Ser 1.03; Hemoglobin 8.6; Platelets 363; Potassium 3.8; Sodium 142  Recent Lipid Panel    Component Value Date/Time   CHOL 173 12/10/2022 1114   TRIG 124 12/10/2022 1114   HDL 37 (L) 12/10/2022 1114   CHOLHDL 4.7 12/10/2022  1114   LDLCALC 114 (H) 12/10/2022 1114    PHYSICAL EXAM:    VS:  There were no vitals taken for this visit.  BMI: There is no height or weight on file to calculate BMI.  Physical Exam  Wt Readings from Last 3 Encounters:  04/29/23 234 lb 8 oz (106.4 kg)  04/27/23 228 lb 9.6 oz (103.7 kg)  04/15/23 234 lb 9.6 oz (106.4 kg)     ASSESSMENT & PLAN:   Dyspnea and chest tightness:  Symptomatic anemia: In the setting of hemorrhagic shock associated with hemorrhoidectomy with hemoglobin trending from a baseline of 15 to 16 trending to 8 in 02/2023.  Hemoglobin has not been trended since.  Tracheobronchomalacia/COPD/ongoing tobacco use/pulmonary nodule:  OSA:      {Are you ordering a CV Procedure (e.g. stress test, cath, DCCV, TEE, etc)?   Press F2        :[id number]}     Disposition: F/u with Dr. Okey Dupre or an APP in ***.   Medication Adjustments/Labs and Tests Ordered: Current medicines are reviewed at length with the patient today.  Concerns regarding medicines are outlined above. Medication changes, Labs and Tests ordered today are summarized above and listed in the Patient Instructions accessible in Encounters.   Signed, Eula Listen, PA-C 05/10/2023 8:55 AM     [facility] -  [address] [place], [place] [address] [phone]

## 2023-05-11 ENCOUNTER — Ambulatory Visit: Payer: MEDICAID | Admitting: Physician Assistant

## 2023-05-12 ENCOUNTER — Other Ambulatory Visit: Payer: Self-pay

## 2023-05-12 ENCOUNTER — Telehealth: Payer: Self-pay

## 2023-05-12 NOTE — Telephone Encounter (Signed)
 Copied from CRM [id number]. Topic: Clinical - Medication Question >> May 12, 2023  9:13 AM Beacher May wrote: Reason for CRM: Jasmine from [employer] is calling on behalf of the pt to confirm if the multiple faxes were received in regards to pt's medication/provider signature, and instructions for usage of medications, and to confirm [facility] fax number.

## 2023-05-12 NOTE — Telephone Encounter (Signed)
 I have not seen.  I was not here

## 2023-05-18 ENCOUNTER — Telehealth: Payer: Self-pay | Admitting: Student in an Organized Health Care Education/Training Program

## 2023-05-18 NOTE — Telephone Encounter (Signed)
 Per telephone encounter from 4/7, Suli notified the patient of his sleep study results and placed the order for the APAP machine.   I called the patient. He did recall talking to [facility] and asked when he would hear from someone. I told him as soon a it was approved by his insurance and they had the device the DME company would reach out to him to get him the device.  He will call back if he has anymore questions.  Nothing further needed.

## 2023-05-18 NOTE — Telephone Encounter (Signed)
 Copied from CRM [id number]. Topic: Clinical - Lab/Test Results >> May 18, 2023  2:06 PM Juliana Ocean wrote: Reason for CRM: sleep study results

## 2023-05-19 ENCOUNTER — Other Ambulatory Visit: Payer: MEDICAID

## 2023-05-19 ENCOUNTER — Telehealth: Payer: Self-pay | Admitting: *Deleted

## 2023-05-19 NOTE — Telephone Encounter (Signed)
 Copied from CRM [id number]. Topic: Clinical - Lab/Test Results >> May 18, 2023  2:13 PM Juliana Ocean wrote: Reason for CRM: pt received text his sleep study results are in.  Please call pt to discuss.  NT prefers someone w/ more expertise in this area to go ove these results.  thanks

## 2023-05-20 NOTE — Telephone Encounter (Signed)
 Pt scheduled with Dr. Kieran Pellet for 05/24/23  to review results.

## 2023-05-24 ENCOUNTER — Encounter: Payer: Self-pay | Admitting: Sleep Medicine

## 2023-05-24 ENCOUNTER — Ambulatory Visit: Payer: MEDICAID | Admitting: Sleep Medicine

## 2023-05-24 VITALS — BP 130/80 | HR 89 | Ht 70.0 in | Wt 233.0 lb

## 2023-05-24 DIAGNOSIS — E669 Obesity, unspecified: Secondary | ICD-10-CM

## 2023-05-24 DIAGNOSIS — G4733 Obstructive sleep apnea (adult) (pediatric): Secondary | ICD-10-CM

## 2023-05-24 DIAGNOSIS — E66811 Obesity, class 1: Secondary | ICD-10-CM

## 2023-05-24 NOTE — Progress Notes (Signed)
 Name:Victor Alvarez MRN: [id number] DOB: [birth year]   CHIEF COMPLAINT:  PSG F/U   HISTORY OF PRESENT ILLNESS:  Mr. Sanjuan is a 63 y.o. w/ a h/o OSA, anxiety, depression, hyperlipidemia, PTSD and obesity who presents to follow up on PSG results. The patient underwent PSG for reassessment of OSA, which revealed severe OSA (AHI 50, O2 nadir 79%).    PAST MEDICAL HISTORY :   has a past medical history of Acute appendicitis (08/24/2019), Allergy, Arthritis, Bipolar affective disorder (HCC), Colon polyp, COPD (chronic obstructive pulmonary disease) (HCC), Depression, GERD (gastroesophageal reflux disease), Hemorrhagic shock (HCC) (02/16/2023), Hemorrhoids, Hypercholesterolemia, Mixed hyperlipidemia, Neuromuscular disorder (HCC), PTSD (post-traumatic stress disorder), Right wrist fracture (03/25/2015), Sleep apnea, and Type 2 diabetes mellitus without complication, without long-term current use of insulin  (HCC).  has a past surgical history that includes Xi robotic laparoscopic assisted appendectomy (N/A, 08/24/2019); Wrist fracture surgery (Right, 04/11/2015); Colonoscopy with propofol  (N/A, 01/18/2023); polypectomy (01/18/2023); Hemostasis clip placement (01/18/2023); Cataract extraction w/ intraocular lens implant (Right); Rectal exam under anesthesia (N/A, 02/10/2023); Hemorrhoid surgery (N/A, 02/10/2023); Minor fulgeration of anal condyloma (N/A, 02/10/2023); and Rectal exam under anesthesia (N/A, 02/16/2023). Prior to Admission medications   Medication Sig Start Date End Date Taking? Authorizing Provider  Accu-Chek Softclix Lancets lancets Use as instructed to check blood glucose daily. Dx E11.9 03/01/23  Yes Ostwalt, Janna, PA-C  albuterol  (VENTOLIN  HFA) 108 (90 Base) MCG/ACT inhaler Inhale 2 puffs into the lungs every 6 (six) hours as needed for wheezing or shortness of breath. 03/03/23  Yes Ostwalt, Janna, PA-C  aspirin  81 MG EC tablet Take 81 mg by mouth daily. 02/24/17  Yes [provider]   B Complex-C (B-COMPLEX WITH VITAMIN C) tablet Take 1 tablet by mouth daily.   Yes [provider]  Blood Glucose Monitoring Suppl (ONE TOUCH ULTRA 2) w/Device KIT Use to check blood sugar daily. Dx E11.9 03/01/23  Yes Ostwalt, Janna, PA-C  cariprazine  (VRAYLAR ) 3 MG capsule Take 1 capsule (3 mg total) by mouth daily. 12/30/22  Yes Normie Becton, FNP  cetirizine  (ZYRTEC ) 10 MG chewable tablet Chew 1 tablet (10 mg total) by mouth daily. 03/25/23  Yes Ostwalt, Janna, PA-C  cetirizine  (ZYRTEC ) 10 MG tablet Take 1 tablet (10 mg total) by mouth daily. 04/15/23  Yes Ostwalt, Janna, PA-C  Cholecalciferol (VITAMIN D3) 50 MCG (2000 UT) capsule Take 1 capsule (2,000 Units total) by mouth daily. 04/08/23  Yes Ostwalt, Janna, PA-C  escitalopram  (LEXAPRO ) 20 MG tablet Take 20 mg by mouth daily.   Yes [provider]  fluticasone  (FLONASE ) 50 MCG/ACT nasal spray Place 2 sprays into both nostrils daily. sometimes uses in the evening as needed 03/03/23  Yes Ostwalt, Janna, PA-C  glucose blood (ONETOUCH ULTRA TEST) test strip Use as instructed to check blood glucose daily. Dx E11.9 03/01/23  Yes Ostwalt, Janna, PA-C  ibuprofen  (ADVIL ) 800 MG tablet Take 1 tablet (800 mg total) by mouth every 8 (eight) hours as needed. 02/10/23  Yes Flynn Hylan, MD  metoprolol  succinate (TOPROL -XL) 25 MG 24 hr tablet Take 1 tablet (25 mg total) by mouth daily. 03/03/23  Yes Ostwalt, Janna, PA-C  omeprazole  (PRILOSEC) 40 MG capsule Take 1 capsule (40 mg total) by mouth daily. 03/03/23  Yes Ostwalt, Janna, PA-C  prazosin  (MINIPRESS ) 1 MG capsule Take 2 mg by mouth at bedtime.   Yes [provider]  rosuvastatin  (CRESTOR ) 20 MG tablet Take 1 tablet (20 mg total) by mouth daily. 03/03/23  Yes Ostwalt,  Janna, PA-C  Tiotropium Bromide-Olodaterol (STIOLTO RESPIMAT ) 2.5-2.5 MCG/ACT AERS Inhale 2 puffs into the lungs daily. 03/03/23  Yes Ostwalt, Janna, PA-C  trazodone  (DESYREL ) 300 MG tablet Take 300 mg by mouth at  bedtime.   Yes [provider]   Allergies  Allergen Reactions   Codeine Nausea And Vomiting, Other (See Comments) and Nausea Only   Metformin Other (See Comments)    unsure    FAMILY HISTORY:  family history includes Heart attack in his maternal grandmother; Heart disease in his paternal grandfather; Heart disease (age of onset: 40) in his father. SOCIAL HISTORY:  reports that he has been smoking cigarettes. He started smoking about 51 years ago. He has a 51.3 pack-year smoking history. He has quit using smokeless tobacco.  His smokeless tobacco use included chew. He reports that he does not currently use alcohol. He reports that he does not currently use drugs after having used the following drugs: Marijuana.   Review of Systems:  Gen:  Denies  fever, sweats, chills weight loss  HEENT: Denies blurred vision, double vision, ear pain, eye pain, hearing loss, nose bleeds, sore throat Cardiac:  No dizziness, chest pain or heaviness, chest tightness,edema, No JVD Resp:   No cough, -sputum production, -shortness of breath,-wheezing, -hemoptysis,  Gi: Denies swallowing difficulty, stomach pain, nausea or vomiting, diarrhea, constipation, bowel incontinence Gu:  Denies bladder incontinence, burning urine Ext:   Denies Joint pain, stiffness or swelling Skin: Denies  skin rash, easy bruising or bleeding or hives Endoc:  Denies polyuria, polydipsia , polyphagia or weight change Psych:   Denies depression, insomnia or hallucinations  Other:  All other systems negative  VITAL SIGNS: BP 130/80   Pulse 89   Ht 5\' 10"  (1.778 m)   Wt 233 lb (105.7 kg)   SpO2 95% Comment: room air  BMI 33.43 kg/m    Physical Examination:   General Appearance: No distress  EYES PERRLA, EOM intact.   NECK Supple, No JVD Pulmonary: normal breath sounds, No wheezing.  CardiovascularNormal S1,S2.  No m/r/g.   Abdomen: Benign, Soft, non-tender. Skin:   warm, no rashes, no ecchymosis  Extremities:  normal, no cyanosis, clubbing. Neuro:without focal findings,  speech normal  PSYCHIATRIC: Mood, affect within normal limits.   ASSESSMENT AND PLAN  OSA Reviewed PSG results with patient. Starting on APAP therapy set to 4-16 cm H2O. Discussed the consequences of untreated sleep apnea. Advised not to drive drowsy for safety of patient and others. Will follow up in 3 months to review CPAP efficacy and compliance data.    Obesity Counseled patient on diet and lifestyle modification.    Patient  satisfied with Plan of action and management. All questions answered  I spent a total of 32 minutes reviewing chart data, face-to-face evaluation with the patient, counseling and coordination of care as detailed above.    Cloe Sockwell, M.D.  Sleep Medicine [facility]

## 2023-05-24 NOTE — Patient Instructions (Addendum)

## 2023-05-25 ENCOUNTER — Encounter: Payer: Self-pay | Admitting: Genetic Counselor

## 2023-05-25 NOTE — Progress Notes (Deleted)
 Cardiology Office Note    Date:  05/25/2023   ID:  Victor Alvarez, DOB 07-Mar-1960, MRN [id number]  PCP:  Victor Bunting, PA-C  Cardiologist:  Victor Crisp, MD  Electrophysiologist:  None   Chief Complaint: Follow-up  History of Present Illness:   Victor Alvarez is a 63 y.o. male with history of CAD noted on CT imaging, aortic atherosclerosis, tracheobronchomalacia, pulmonary nodule, emphysema with ongoing tobacco use, GI bleed with hemorrhagic shock following hemorrhoidectomy in 02/2023, DM 2, bipolar disorder, and OSA not on CPAP who presents for follow-up of echo.  High-resolution chest CT in 01/2023 showed no evidence of ILD with mild emphysema, tracheobronchomalacia, left lower lobe pulmonary nodule measuring 0.5 cm, coronary artery calcification, aortic atherosclerosis, hepatic steatosis, and nonobstructive left nephrolithiasis.  He was admitted in 02/2023 after having previously undergone hemorrhoidectomy earlier in the month presenting with rectal bleeding complicated by hemorrhagic shock requiring vasopressor support with hemoglobin downtrending to 8 (baseline around 15-16).  He was seen by Victor Alvarez as a new patient in 03/2023 at the request of pulmonology for evaluation of shortness of breath.  At that time he reported having dyspnea for many years, likely more than a decade that was progressively getting worse.  He reported at times shortness of breath with bending over.  He also reported occasional twinges of chest pain that would occur randomly and were not associated with exertion.  He continued to smoke and was down to 8 to 10 cigarettes/day (previously smoked 1 pack/day).  PFTs were pending.  Echo in 04/2023 showed an EF of 60 to 65%, no regional wall motion abnormalities, grade 1 diastolic dysfunction, normal RV systolic function and ventricular cavity size, no significant valvular abnormalities, and an estimated right atrial pressure of 3 mmHg.  ***   Labs independently  reviewed: 02/2023 - BUN 11, serum creatinine 1.03, potassium 3.8, Hgb 8.6, PLT 363, magnesium 1.2, albumin 2.6, AST/ALT not elevated 12/2022 - A1c 6.6, TC 173, TG 124, HDL 37, LDL 114, TSH normal  Past Medical History:  Diagnosis Date   Acute appendicitis 08/24/2019   Allergy    Arthritis    Bipolar affective disorder (HCC)    Colon polyp    COPD (chronic obstructive pulmonary disease) (HCC)    Depression    GERD (gastroesophageal reflux disease)    Hemorrhagic shock (HCC) 02/16/2023   Hemorrhoids    Hypercholesterolemia    Mixed hyperlipidemia    Neuromuscular disorder (HCC)    PTSD (post-traumatic stress disorder)    Right wrist fracture 03/25/2015   Sleep apnea    Type 2 diabetes mellitus without complication, without long-term current use of insulin  (HCC)     Past Surgical History:  Procedure Laterality Date   CATARACT EXTRACTION W/ INTRAOCULAR LENS IMPLANT Right    COLONOSCOPY WITH PROPOFOL  N/A 01/18/2023   Procedure: COLONOSCOPY WITH PROPOFOL ;  Surgeon: Victor Salaam, MD;  Location: [facility] ENDOSCOPY;  Service: Gastroenterology;  Laterality: N/A;   HEMORRHOID SURGERY N/A 02/10/2023   Procedure: HEMORRHOIDECTOMY;  Surgeon: Victor Hylan, MD;  Location: [facility] ORS;  Service: General;  Laterality: N/A;  surgifoam placed   HEMOSTASIS CLIP PLACEMENT  01/18/2023   Procedure: HEMOSTASIS CLIP PLACEMENT;  Surgeon: Victor Salaam, MD;  Location: [facility] ENDOSCOPY;  Service: Gastroenterology;;   MINOR FULGERATION OF ANAL CONDYLOMA N/A 02/10/2023   Procedure: MINOR FULGERATION OF ANAL CONDYLOMA;  Surgeon: Victor Hylan, MD;  Location: [facility] ORS;  Service: General;  Laterality: N/A;   POLYPECTOMY  01/18/2023   Procedure: POLYPECTOMY;  Surgeon: Victor Alvarez,  Victor Rail, MD;  Location: [facility] ENDOSCOPY;  Service: Gastroenterology;;   RECTAL EXAM UNDER ANESTHESIA N/A 02/10/2023   Procedure: RECTAL EXAM UNDER ANESTHESIA;  Surgeon: Victor Hylan, MD;  Location: [facility] ORS;  Service: General;  Laterality: N/A;    RECTAL EXAM UNDER ANESTHESIA N/A 02/16/2023   Procedure: RECTAL EXAM UNDER ANESTHESIA;  Surgeon: Victor Alma, MD;  Location: [facility] ORS;  Service: General;  Laterality: N/A;   WRIST FRACTURE SURGERY Right 04/11/2015   XI ROBOTIC LAPAROSCOPIC ASSISTED APPENDECTOMY N/A 08/24/2019   Procedure: XI ROBOTIC LAPAROSCOPIC ASSISTED APPENDECTOMY;  Surgeon: Victor Hylan, MD;  Location: [facility] ORS;  Service: General;  Laterality: N/A;    Current Medications: No outpatient medications have been marked as taking for the 05/26/23 encounter (Appointment) with Victor Chick, PA-C.    Allergies:   Codeine and Metformin   Social History   Socioeconomic History   Marital status: Single    Spouse name: Not on file   Number of children: 1   Years of education: Not on file   Highest education level: Not on file  Occupational History   Not on file  Tobacco Use   Smoking status: Every Day    Current packs/day: 1.00    Average packs/day: 1 pack/day for 51.3 years (51.3 ttl pk-yrs)    Types: Cigarettes    Start date: 28   Smokeless tobacco: Former    Types: Chew   Tobacco comments:    0.5PPD  Vaping Use   Vaping status: Former  Substance and Sexual Activity   Alcohol use: Not Currently   Drug use: Not Currently    Types: Marijuana    Comment: last  used 2-3 weeks ago   Sexual activity: Not on file  Other Topics Concern   Not on file  Social History Narrative   Lives alone   Social Drivers of Health   Financial Resource Strain: Low Risk  (05/05/2023)   Overall Financial Resource Strain (CARDIA)    Difficulty of Paying Living Expenses: Not very hard  Food Insecurity: No Food Insecurity (05/05/2023)   Hunger Vital Sign    Worried About Running Out of Food in the Last Year: Never true    Ran Out of Food in the Last Year: Never true  Transportation Needs: No Transportation Needs (05/05/2023)   PRAPARE - Administrator, Civil Service (Medical): No    Lack of Transportation  (Non-Medical): No  Physical Activity: Not on file  Stress: Stress Concern Present (04/29/2023)   [employer] of Occupational Health - Occupational Stress Questionnaire    Feeling of Stress : To some extent  Social Connections: Not on file     Family History:  The patient's family history includes Heart attack in his maternal grandmother; Heart disease in his paternal grandfather; Heart disease (age of onset: 44) in his father.  ROS:   12-point review of systems is negative unless otherwise noted in the HPI.   EKGs/Labs/Other Studies Reviewed:    Studies reviewed were summarized above. The additional studies were reviewed today:  2D echo 04/20/2023: 1. Left ventricular ejection fraction, by estimation, is 60 to 65%. The  left ventricle has normal function. The left ventricle has no regional  wall motion abnormalities. Left ventricular diastolic parameters are  consistent with Grade I diastolic  dysfunction (impaired relaxation).   2. Right ventricular systolic function is normal. The right ventricular  size is normal. Tricuspid regurgitation signal is inadequate for assessing  PA pressure.   3. The  mitral valve is normal in structure. No evidence of mitral valve  regurgitation. No evidence of mitral stenosis.   4. The aortic valve is normal in structure. Aortic valve regurgitation is  not visualized. No aortic stenosis is present.   5. The inferior vena cava is normal in size with greater than 50%  respiratory variability, suggesting right atrial pressure of 3 mmHg.    EKG:  EKG is ordered today.  The EKG ordered today demonstrates ***  Recent Labs: 12/10/2022: TSH 2.460 02/16/2023: ALT 11 02/17/2023: Magnesium 1.7 02/19/2023: BUN 11; Creatinine, Ser 1.03; Hemoglobin 8.6; Platelets 363; Potassium 3.8; Sodium 142  Recent Lipid Panel    Component Value Date/Time   CHOL 173 12/10/2022 1114   TRIG 124 12/10/2022 1114   HDL 37 (L) 12/10/2022 1114   CHOLHDL 4.7 12/10/2022  1114   LDLCALC 114 (H) 12/10/2022 1114    PHYSICAL EXAM:    VS:  There were no vitals taken for this visit.  BMI: There is no height or weight on file to calculate BMI.  Physical Exam  Wt Readings from Last 3 Encounters:  05/24/23 233 lb (105.7 kg)  04/29/23 234 lb 8 oz (106.4 kg)  04/27/23 228 lb 9.6 oz (103.7 kg)     ASSESSMENT & PLAN:   Dyspnea and chest tightness:  Symptomatic anemia: In the setting of hemorrhagic shock associated with hemorrhoidectomy with hemoglobin trending from a baseline of 15 to 16 trending to 8 in 02/2023.  Hemoglobin has not been trended since.  Tracheobronchomalacia/COPD/ongoing tobacco use/pulmonary nodule:  OSA:      {Are you ordering a CV Procedure (e.g. stress test, cath, DCCV, TEE, etc)?   Press F2        :[id number]}     Disposition: F/u with Victor Alvarez or an APP in ***.   Medication Adjustments/Labs and Tests Ordered: Current medicines are reviewed at length with the patient today.  Concerns regarding medicines are outlined above. Medication changes, Labs and Tests ordered today are summarized above and listed in the Patient Instructions accessible in Encounters.   Signed, Varney Gentleman, PA-C 05/25/2023 9:46 AM     [facility] -  [address] [place], [place] [address] [phone]

## 2023-05-26 ENCOUNTER — Ambulatory Visit: Payer: MEDICAID | Attending: Physician Assistant | Admitting: Physician Assistant

## 2023-06-01 ENCOUNTER — Telehealth: Payer: Self-pay | Admitting: Licensed Clinical Social Worker

## 2023-06-01 NOTE — Telephone Encounter (Signed)
 Pt called to cancel GEN COUNSEL appts. He stated he did not want to r/s

## 2023-06-02 ENCOUNTER — Inpatient Hospital Stay: Payer: MEDICAID | Admitting: Licensed Clinical Social Worker

## 2023-06-02 ENCOUNTER — Inpatient Hospital Stay: Payer: MEDICAID

## 2023-06-09 NOTE — Progress Notes (Unsigned)
 Complete physical exam  Patient: Victor Alvarez   DOB: 04-05-1960   63 y.o. Male  MRN: [id number] Visit Date: 06/10/2023  Today's healthcare provider: Blane Bunting, PA-C   Chief Complaint  Patient presents with  . Annual Exam    CPE.leg weakness both , Kidnery stones   Subjective    Victor Alvarez is a 63 y.o. male who presents today for a complete physical exam.   Discussed the use of AI scribe software for clinical note transcription with the patient, who gave verbal consent to proceed.  History of Present Illness Victor Alvarez is a 63 year old male who presents with dizziness and weakness.  He experiences dizziness, especially when standing, with associated leg weakness affecting balance and ambulation. Blood pressure was recently 114/71 with a heart rate of 80. He uses a sleep machine for sleep apnea, having started two days ago. Diabetes is managed through diet with noted improvement in memory. He takes Vraylar , recently increased to 4.5 mg, with good behavioral health support. Smoking is reduced to half a pack daily, and alcohol is consumed occasionally. He experiences frequent bloating linked to diet, without heartburn or sore taste. Shortness of breath occurs even at rest, attributed to a lung condition. He has not had recent eye exams or blood work. He had knee surgery last year, with persistent leg weakness and numbness since 3-5 months post-surgery.    Last depression screening scores    06/10/2023    8:25 AM 05/05/2023   11:37 PM 04/29/2023    9:38 AM  PHQ 2/9 Scores  PHQ - 2 Score 3 2 2   PHQ- 9 Score 11  6   Last fall risk screening    06/10/2023    8:21 AM  Fall Risk   Falls in the past year? 1  Number falls in past yr: 1  Injury with Fall? 0  Follow up Falls evaluation completed   Last Audit-C alcohol use screening    04/29/2023    9:28 AM  Alcohol Use Disorder Test (AUDIT)  1. How often do you have a drink containing alcohol? 0  2. How many drinks containing  alcohol do you have on a typical day when you are drinking? 0  3. How often do you have six or more drinks on one occasion? 0  AUDIT-C Score 0   A score of 3 or more in women, and 4 or more in men indicates increased risk for alcohol abuse, EXCEPT if all of the points are from question 1   Past Medical History:  Diagnosis Date  . Acute appendicitis 08/24/2019  . Allergy   . Arthritis   . Bipolar affective disorder (HCC)   . Colon polyp   . COPD (chronic obstructive pulmonary disease) (HCC)   . Depression   . GERD (gastroesophageal reflux disease)   . Hemorrhagic shock (HCC) 02/16/2023  . Hemorrhoids   . Hypercholesterolemia   . Mixed hyperlipidemia   . Neuromuscular disorder (HCC)   . PTSD (post-traumatic stress disorder)   . Right wrist fracture 03/25/2015  . Sleep apnea   . Type 2 diabetes mellitus without complication, without long-term current use of insulin  [facility])    Past Surgical History:  Procedure Laterality Date  . CATARACT EXTRACTION W/ INTRAOCULAR LENS IMPLANT Right   . COLONOSCOPY WITH PROPOFOL  N/A 01/18/2023   Procedure: COLONOSCOPY WITH PROPOFOL ;  Surgeon: Luke Salaam, MD;  Location: [facility] ENDOSCOPY;  Service: Gastroenterology;  Laterality: N/A;  . HEMORRHOID SURGERY N/A 02/10/2023  Procedure: HEMORRHOIDECTOMY;  Surgeon: Flynn Hylan, MD;  Location: [facility] ORS;  Service: General;  Laterality: N/A;  surgifoam placed  . HEMOSTASIS CLIP PLACEMENT  01/18/2023   Procedure: HEMOSTASIS CLIP PLACEMENT;  Surgeon: Luke Salaam, MD;  [facility];  Service: Gastroenterology;;  . MINOR FULGERATION OF ANAL CONDYLOMA N/A 02/10/2023   Procedure: MINOR FULGERATION OF ANAL CONDYLOMA;  Surgeon: Flynn Hylan, MD;  Location: [facility] ORS;  Service: General;  Laterality: N/A;  . POLYPECTOMY  01/18/2023   Procedure: POLYPECTOMY;  Surgeon: Luke Salaam, MD;  Location: [facility] ENDOSCOPY;  Service: Gastroenterology;;  . RECTAL EXAM UNDER ANESTHESIA N/A 02/10/2023   Procedure: RECTAL  EXAM UNDER ANESTHESIA;  Surgeon: Flynn Hylan, MD;  Location: [facility] ORS;  Service: General;  Laterality: N/A;  . RECTAL EXAM UNDER ANESTHESIA N/A 02/16/2023   Procedure: RECTAL EXAM UNDER ANESTHESIA;  Surgeon: Alben Alma, MD;  Location: [facility] ORS;  Service: General;  Laterality: N/A;  . WRIST FRACTURE SURGERY Right 04/11/2015  . XI ROBOTIC LAPAROSCOPIC ASSISTED APPENDECTOMY N/A 08/24/2019   Procedure: XI ROBOTIC LAPAROSCOPIC ASSISTED APPENDECTOMY;  Surgeon: Flynn Hylan, MD;  Location: [facility] ORS;  Service: General;  Laterality: N/A;   Social History   Socioeconomic History  . Marital status: Single    Spouse name: Not on file  . Number of children: 1  . Years of education: Not on file  . Highest education level: Not on file  Occupational History  . Not on file  Tobacco Use  . Smoking status: Every Day    Current packs/day: 1.00    Average packs/day: 1 pack/day for 51.4 years (51.4 ttl pk-yrs)    Types: Cigarettes    Start date: 73  . Smokeless tobacco: Former    Types: Chew  . Tobacco comments:    0.5PPD  Vaping Use  . Vaping status: Former  Substance and Sexual Activity  . Alcohol use: Not Currently  . Drug use: Not Currently    Types: Marijuana    Comment: last  used 2-3 weeks ago  . Sexual activity: Not on file  Other Topics Concern  . Not on file  Social History Narrative   Lives alone   Social Drivers of Health   Financial Resource Strain: Low Risk  (05/05/2023)   Overall Financial Resource Strain (CARDIA)   . Difficulty of Paying Living Expenses: Not very hard  Food Insecurity: No Food Insecurity (05/05/2023)   Hunger Vital Sign   . Worried About Programme researcher, broadcasting/film/video in the Last Year: Never true   . Ran Out of Food in the Last Year: Never true  Transportation Needs: No Transportation Needs (05/05/2023)   PRAPARE - Transportation   . Lack of Transportation (Medical): No   . Lack of Transportation (Non-Medical): No  Physical Activity: Not on file  Stress:  Stress Concern Present (04/29/2023)   [employer] of Occupational Health - Occupational Stress Questionnaire   . Feeling of Stress : To some extent  Social Connections: Not on file  Intimate Partner Violence: Not At Risk (05/05/2023)   Humiliation, Afraid, Rape, and Kick questionnaire   . Fear of Current or Ex-Partner: No   . Emotionally Abused: No   . Physically Abused: No   . Sexually Abused: No   Family Status  Relation Name Status  . Mother  Deceased  . Father  Deceased  . MGM  (Not Specified)  . PGF  (Not Specified)  No partnership data on file   Family History  Problem Relation Age of  Onset  . Heart disease Father 39  . Heart attack Maternal Grandmother   . Heart disease Paternal Grandfather    Allergies  Allergen Reactions  . Codeine Nausea And Vomiting, Other (See Comments) and Nausea Only  . Metformin Other (See Comments)    unsure    Patient Care Team: Eligha Kmetz, PA-C as PCP - General (Physician Assistant) End, Veryl Gottron, MD as PCP - Cardiology (Cardiology) Marcelle Sergeant as Psychiatrist (Psychiatry)   Medications: Outpatient Medications Prior to Visit  Medication Sig  . Accu-Chek Softclix Lancets lancets Use as instructed to check blood glucose daily. Dx E11.9  . albuterol  (VENTOLIN  HFA) 108 (90 Base) MCG/ACT inhaler Inhale 2 puffs into the lungs every 6 (six) hours as needed for wheezing or shortness of breath.  . aspirin  81 MG EC tablet Take 81 mg by mouth daily.  . B Complex-C (B-COMPLEX WITH VITAMIN C) tablet Take 1 tablet by mouth daily.  . Blood Glucose Monitoring Suppl (ONE TOUCH ULTRA 2) w/Device KIT Use to check blood sugar daily. Dx E11.9  . cariprazine  (VRAYLAR ) 3 MG capsule Take 1 capsule (3 mg total) by mouth daily.  . cetirizine  (ZYRTEC ) 10 MG chewable tablet Chew 1 tablet (10 mg total) by mouth daily.  . cetirizine  (ZYRTEC ) 10 MG tablet Take 1 tablet (10 mg total) by mouth daily.  . Cholecalciferol (VITAMIN D3) 50 MCG (2000 UT) capsule  Take 1 capsule (2,000 Units total) by mouth daily.  . escitalopram  (LEXAPRO ) 20 MG tablet Take 20 mg by mouth daily.  . fluticasone  (FLONASE ) 50 MCG/ACT nasal spray Place 2 sprays into both nostrils daily. sometimes uses in the evening as needed  . glucose blood (ONETOUCH ULTRA TEST) test strip Use as instructed to check blood glucose daily. Dx E11.9  . ibuprofen  (ADVIL ) 800 MG tablet Take 1 tablet (800 mg total) by mouth every 8 (eight) hours as needed.  . metoprolol  succinate (TOPROL -XL) 25 MG 24 hr tablet Take 1 tablet (25 mg total) by mouth daily.  . omeprazole  (PRILOSEC) 40 MG capsule Take 1 capsule (40 mg total) by mouth daily.  . prazosin  (MINIPRESS ) 1 MG capsule Take 2 mg by mouth at bedtime.  . rosuvastatin  (CRESTOR ) 20 MG tablet Take 1 tablet (20 mg total) by mouth daily.  . Tiotropium Bromide-Olodaterol (STIOLTO RESPIMAT ) 2.5-2.5 MCG/ACT AERS Inhale 2 puffs into the lungs daily.  . trazodone  (DESYREL ) 300 MG tablet Take 300 mg by mouth at bedtime.   No facility-administered medications prior to visit.    Review of Systems  All other systems reviewed and are negative.  Except see HPI  {Insert previous labs (optional):23779} {See past labs  Heme  Chem  Endocrine  Serology  Results Review (optional):1}  Objective    BP 110/68 (BP Location: Right Arm, Patient Position: Sitting)   Pulse 84   Resp 16   Ht 5\' 10"  (1.778 m)   Wt 222 lb 14.4 oz (101.1 kg)   SpO2 98%   BMI 31.98 kg/m  {Insert last BP/Wt (optional):23777}{See vitals history (optional):1}    Physical Exam Vitals reviewed.  Constitutional:      General: He is not in acute distress.    Appearance: Normal appearance. He is well-developed. He is not ill-appearing, toxic-appearing or diaphoretic.  HENT:     Head: Normocephalic and atraumatic.     Right Ear: Tympanic membrane, ear canal and external ear normal.     Left Ear: Tympanic membrane, ear canal and external ear normal.     Nose:  Nose normal. No  congestion or rhinorrhea.     Mouth/Throat:     Mouth: Mucous membranes are moist.     Pharynx: Oropharynx is clear. No oropharyngeal exudate.  Eyes:     General: No scleral icterus.       Right eye: No discharge.        Left eye: No discharge.     Conjunctiva/sclera: Conjunctivae normal.     Pupils: Pupils are equal, round, and reactive to light.  Neck:     Thyroid: No thyromegaly.     Vascular: No carotid bruit.  Cardiovascular:     Rate and Rhythm: Normal rate and regular rhythm.     Pulses: Normal pulses.     Heart sounds: Normal heart sounds. No murmur heard.    No friction rub. No gallop.  Pulmonary:     Effort: Pulmonary effort is normal. No respiratory distress.     Breath sounds: Normal breath sounds. No wheezing or rales.  Abdominal:     General: Abdomen is flat. Bowel sounds are normal. There is no distension.     Palpations: Abdomen is soft. There is no mass.     Tenderness: There is no abdominal tenderness. There is no right CVA tenderness, left CVA tenderness, guarding or rebound.     Hernia: No hernia is present.  Musculoskeletal:        General: No swelling, tenderness, deformity or signs of injury. Normal range of motion.     Cervical back: Normal range of motion and neck supple. No rigidity or tenderness.     Right lower leg: No edema.     Left lower leg: No edema.  Lymphadenopathy:     Cervical: No cervical adenopathy.  Skin:    General: Skin is warm and dry.     Coloration: Skin is not jaundiced or pale.     Findings: No bruising, erythema, lesion or rash.  Neurological:     Mental Status: He is alert and oriented to person, place, and time. Mental status is at baseline.     Gait: Gait normal.  Psychiatric:        Mood and Affect: Mood normal.        Behavior: Behavior normal.        Thought Content: Thought content normal.        Judgment: Judgment normal.      Results for orders placed or performed in visit on 06/10/23  Comprehensive metabolic  panel with GFR  Result Value Ref Range   Glucose 132 (H) 70 - 99 mg/dL   BUN 7 (L) 8 - 27 mg/dL   Creatinine, Ser 1.61 0.76 - 1.27 mg/dL   eGFR 96 >09 UE/AVW/0.98   BUN/Creatinine Ratio 8 (L) 10 - 24   Sodium 141 134 - 144 mmol/L   Potassium 5.1 3.5 - 5.2 mmol/L   Chloride 101 96 - 106 mmol/L   CO2 28 20 - 29 mmol/L   Calcium  10.2 8.6 - 10.2 mg/dL   Total Protein 6.1 6.0 - 8.5 g/dL   Albumin 4.0 3.9 - 4.9 g/dL   Globulin, Total 2.1 1.5 - 4.5 g/dL   Bilirubin Total <1.1 0.0 - 1.2 mg/dL   Alkaline Phosphatase 123 (H) 44 - 121 IU/L   AST 17 0 - 40 IU/L   ALT 28 0 - 44 IU/L  CBC with Differential/Platelet  Result Value Ref Range   WBC 8.8 3.4 - 10.8 x10E3/uL   RBC 5.61 4.14 - 5.80 x10E6/uL  Hemoglobin 13.9 13.0 - 17.7 g/dL   Hematocrit 86.5 78.4 - 51.0 %   MCV 82 79 - 97 fL   MCH 24.8 (L) 26.6 - 33.0 pg   MCHC 30.1 (L) 31.5 - 35.7 g/dL   RDW 69.6 29.5 - 28.4 %   Platelets 466 (H) 150 - 450 x10E3/uL   Neutrophils 62 Not Estab. %   Lymphs 22 Not Estab. %   Monocytes 10 Not Estab. %   Eos 4 Not Estab. %   Basos 1 Not Estab. %   Neutrophils Absolute 5.5 1.4 - 7.0 x10E3/uL   Lymphocytes Absolute 1.9 0.7 - 3.1 x10E3/uL   Monocytes Absolute 0.9 0.1 - 0.9 x10E3/uL   EOS (ABSOLUTE) 0.4 0.0 - 0.4 x10E3/uL   Basophils Absolute 0.1 0.0 - 0.2 x10E3/uL   Immature Granulocytes 1 Not Estab. %   Immature Grans (Abs) 0.1 0.0 - 0.1 x10E3/uL  Hemoglobin A1c  Result Value Ref Range   Hgb A1c MFr Bld 8.0 (H) 4.8 - 5.6 %   Est. average glucose Bld gHb Est-mCnc 183 mg/dL  Lipid panel  Result Value Ref Range   Cholesterol, Total 132 100 - 199 mg/dL   Triglycerides 132 (H) 0 - 149 mg/dL   HDL 34 (L) >44 mg/dL   VLDL Cholesterol Cal 38 5 - 40 mg/dL   LDL Chol Calc (NIH) 60 0 - 99 mg/dL   Chol/HDL Ratio 3.9 0.0 - 5.0 ratio  TSH  Result Value Ref Range   TSH 2.260 0.450 - 4.500 uIU/mL    Assessment & Plan    Routine Health Maintenance and Physical Exam  Exercise Activities and Dietary  recommendations  Goals     . Monitor, Self-Manage And Reduce Symptoms of Chronic Obstructive Pulmonary Disease     Current Barriers:  Chronic Disease Management support and education needs related to COPD  Planned Interventions: Provided patient with basic written and verbal COPD education on self care/management/and exacerbation prevention Reviewed medications and importance of compliance. Reports taking medications as prescribed. Advised to call with concerns related to medication management or prescription cost. Advised patient to track and manage COPD triggers. Reports symptoms have been well controlled. Reports not requiring oxygen therapy. Advised patient to self assesses COPD action plan zone and make appointment with provider if in the yellow zone for 48 hours without improvement Provided education about and advised patient to utilize infection prevention strategies to reduce risk of respiratory infection Discussed the importance of adequate rest and management of fatigue with COPD Provided information regarding safety and fall prevention measures Discussed plan following Sleep Study on April 06, 2023. He reports using a CPAP in the past but the device is currently damaged. Will contact his Pulmonology provider/Dr. Darnelle Elders regarding plan. Assessed social determinant of health barriers  Symptom Management: Take medications as prescribed   Attend all scheduled provider appointments Call pharmacy for medication refills 3-7 days in advance of running out of medications Eliminate symptom triggers at home Follow rescue plan if symptoms flare-up Call provider office for new concerns or questions          . Vaccines     Current Barriers:  Care Coordination needs related to obtaining vaccines.  Planned Interventions: Message received from provider regarding assistance with obtaining vaccine. Patient reports interest in obtaining HPV vaccination. Notes he has not completed the  Shingrix series and also interested in obtaining this vaccine. Notes possible changes with insurance copays and unsure where these vaccines can be obtained at low or  no cost. Will follow up via conference call with insurance provider to confirm and schedule vaccines.         Immunization History  Administered Date(s) Administered  . Fluzone Influenza virus vaccine,trivalent (IIV3), split virus 11/19/2015  . Hep A / Hep B 11/28/2008, 01/01/2009, 06/04/2009  . Influenza, Quadrivalent, Recombinant, Inj, Pf 10/29/2020  . Influenza, Seasonal, Injecte, Preservative Fre 11/02/2017  . Influenza,inj,Quad PF,6+ Mos 09/28/2016, 12/02/2021  . Influenza-Unspecified 11/28/2008  . Moderna Covid-19 Vaccine Bivalent Booster 26yrs & up 11/12/2020  . Moderna Sars-Covid-2 Vaccination 08/13/2019, 09/14/2019  . PNEUMOCOCCAL CONJUGATE-20 04/29/2023  . Pneumococcal Polysaccharide-23 11/05/2016  . Tdap 11/28/2008, 12/20/2013  . Zoster Recombinant(Shingrix) 08/06/2021    Health Maintenance  Topic Date Due  . Complete foot exam   Never done  . Eye exam for diabetics  Never done  . Hepatitis C Screening  Never done  . Zoster (Shingles) Vaccine (2 of 2) 10/01/2021  . COVID-19 Vaccine (4 - 2024-25 season) 10/04/2022  . Flu Shot  09/03/2023  . Hemoglobin A1C  12/11/2023  . DTaP/Tdap/Td vaccine (3 - Td or Tdap) 12/21/2023  . Yearly kidney health urinalysis for diabetes  01/08/2024  . Colon Cancer Screening  01/18/2024  . Screening for Lung Cancer  01/20/2024  . Yearly kidney function blood test for diabetes  06/09/2024  . Medicare Annual Wellness Visit  06/09/2024  . Pneumonia Vaccine  Completed  . HIV Screening  Completed  . HPV Vaccine  Aged Out  . Meningitis B Vaccine  Aged Out    Discussed health benefits of physical activity, and encouraged him to engage in regular exercise appropriate for his age and condition.  Assessment & Plan Annual physical exam UTD on dental/eye Things to do to keep  yourself healthy  - Exercise at least 30-45 minutes a day, 3-4 days a week.  - Eat a low-fat diet with lots of fruits and vegetables, up to 7-9 servings per day.  - Seatbelts can save your life. Wear them always.  - Smoke detectors on every level of your home, check batteries every year.  - Eye Doctor - have an eye exam every 1-2 years  - Safe sex - if you may be exposed to STDs, use a condom.  - Alcohol -  If you drink, do it moderately, less than 2 drinks per day.  - Health Care Power of Attorney. Choose someone to speak for you if you are not able.  - Depression is common in our stressful world.If you're feeling down or losing interest in things you normally enjoy, please come in for a visit.  - Violence - If anyone is threatening or hurting you, please call immediately.  - Comprehensive metabolic panel with GFR - CBC with Differential/Platelet - Hemoglobin A1c - Lipid panel - TSH  Memory problem Will reassess at the follow-up   Type 2 diabetes mellitus without complication, without long-term current use of insulin  (HCC) *** - Comprehensive metabolic panel with GFR - CBC with Differential/Platelet - Hemoglobin A1c - Lipid panel - TSH  Depression, recurrent (HCC) *** - Comprehensive metabolic panel with GFR - CBC with Differential/Platelet - Hemoglobin A1c - Lipid panel - TSH   Initial Medicare annual wellness visit (Primary) ***  Encounter for complete eye exam *** - Ambulatory referral to Ophthalmology  Chronic pain of right knee *** - Ambulatory referral to Orthopedics  Renal pain *** - Urinalysis, Routine w reflex microscopic Shortness of breath Chronic shortness of breath likely due to pulmonary issues. Managed by  pulmonologist. - Continue follow-up with pulmonologist.  Dizziness Dizziness, especially on standing, possibly due to orthostatic hypotension or medication changes. Recent Vraylar  dosage change noted. - Perform blood work to investigate  causes. - Review medication list at each visit for side effects.  Kidney stone Right kidney stone present, non-obstructive. Pain and nausea reported despite imaging findings. - Consider urology referral if symptoms persist.  Viral condyloma Viral condyloma present. Discussed treatment options including topical treatments and surgical removal. - Reschedule dermatology appointment for evaluation and management.  Diabetes Diabetes managed with dietary modifications. No medications currently.  Depression Depression well-managed with Behavioral Health and Vraylar . Recent dosage increase to 4.5 mg. - Continue management with Behavioral Health. - Ensure medication list is brought to each visit.  Sleep apnea Recently started CPAP therapy. Efficacy not yet determined.   No follow-ups on file.    The patient was advised to call back or seek an in-person evaluation if the symptoms worsen or if the condition fails to improve as anticipated.  I discussed the assessment and treatment plan with the patient. The patient was provided an opportunity to ask questions and all were answered. The patient agreed with the plan and demonstrated an understanding of the instructions.  I, Talyn Dessert, PA-C have reviewed all documentation for this visit. The documentation on 06/10/2023  for the exam, diagnosis, procedures, and orders are all accurate and complete.  Blane Bunting, [facility], MMS [facility] [phone] (phone) [phone] (fax)  [facility]

## 2023-06-10 ENCOUNTER — Ambulatory Visit: Payer: MEDICAID | Admitting: Physician Assistant

## 2023-06-10 ENCOUNTER — Encounter: Payer: Self-pay | Admitting: Physician Assistant

## 2023-06-10 VITALS — BP 110/68 | HR 84 | Resp 16 | Ht 70.0 in | Wt 222.9 lb

## 2023-06-10 DIAGNOSIS — Z0001 Encounter for general adult medical examination with abnormal findings: Secondary | ICD-10-CM | POA: Diagnosis not present

## 2023-06-10 DIAGNOSIS — N23 Unspecified renal colic: Secondary | ICD-10-CM

## 2023-06-10 DIAGNOSIS — F339 Major depressive disorder, recurrent, unspecified: Secondary | ICD-10-CM | POA: Diagnosis not present

## 2023-06-10 DIAGNOSIS — E1159 Type 2 diabetes mellitus with other circulatory complications: Secondary | ICD-10-CM

## 2023-06-10 DIAGNOSIS — R413 Other amnesia: Secondary | ICD-10-CM

## 2023-06-10 DIAGNOSIS — G8929 Other chronic pain: Secondary | ICD-10-CM | POA: Diagnosis not present

## 2023-06-10 DIAGNOSIS — Z0000 Encounter for general adult medical examination without abnormal findings: Secondary | ICD-10-CM

## 2023-06-10 DIAGNOSIS — Z0100 Encounter for examination of eyes and vision without abnormal findings: Secondary | ICD-10-CM

## 2023-06-10 DIAGNOSIS — E119 Type 2 diabetes mellitus without complications: Secondary | ICD-10-CM

## 2023-06-10 DIAGNOSIS — M25561 Pain in right knee: Secondary | ICD-10-CM

## 2023-06-11 ENCOUNTER — Encounter: Payer: Self-pay | Admitting: Physician Assistant

## 2023-06-11 LAB — CBC WITH DIFFERENTIAL/PLATELET
Basophils Absolute: 0.1 10*3/uL (ref 0.0–0.2)
Basos: 1 %
EOS (ABSOLUTE): 0.4 10*3/uL (ref 0.0–0.4)
Eos: 4 %
Hematocrit: 46.2 % (ref 37.5–51.0)
Hemoglobin: 13.9 g/dL (ref 13.0–17.7)
Immature Grans (Abs): 0.1 10*3/uL (ref 0.0–0.1)
Immature Granulocytes: 1 %
Lymphocytes Absolute: 1.9 10*3/uL (ref 0.7–3.1)
Lymphs: 22 %
MCH: 24.8 pg — ABNORMAL LOW (ref 26.6–33.0)
MCHC: 30.1 g/dL — ABNORMAL LOW (ref 31.5–35.7)
MCV: 82 fL (ref 79–97)
Monocytes Absolute: 0.9 10*3/uL (ref 0.1–0.9)
Monocytes: 10 %
Neutrophils Absolute: 5.5 10*3/uL (ref 1.4–7.0)
Neutrophils: 62 %
Platelets: 466 10*3/uL — ABNORMAL HIGH (ref 150–450)
RBC: 5.61 x10E6/uL (ref 4.14–5.80)
RDW: 15.3 % (ref 11.6–15.4)
WBC: 8.8 10*3/uL (ref 3.4–10.8)

## 2023-06-11 LAB — COMPREHENSIVE METABOLIC PANEL WITH GFR
ALT: 28 IU/L (ref 0–44)
AST: 17 IU/L (ref 0–40)
Albumin: 4 g/dL (ref 3.9–4.9)
Alkaline Phosphatase: 123 IU/L — ABNORMAL HIGH (ref 44–121)
BUN/Creatinine Ratio: 8 — ABNORMAL LOW (ref 10–24)
BUN: 7 mg/dL — ABNORMAL LOW (ref 8–27)
Bilirubin Total: <0.2 mg/dL (ref 0.0–1.2)
CO2: 28 mmol/L (ref 20–29)
Calcium: 10.2 mg/dL (ref 8.6–10.2)
Chloride: 101 mmol/L (ref 96–106)
Creatinine, Ser: 0.87 mg/dL (ref 0.76–1.27)
Globulin, Total: 2.1 g/dL (ref 1.5–4.5)
Glucose: 132 mg/dL — ABNORMAL HIGH (ref 70–99)
Potassium: 5.1 mmol/L (ref 3.5–5.2)
Sodium: 141 mmol/L (ref 134–144)
Total Protein: 6.1 g/dL (ref 6.0–8.5)
eGFR: 96 mL/min/{1.73_m2} (ref >59)

## 2023-06-11 LAB — LIPID PANEL
Chol/HDL Ratio: 3.9 ratio (ref 0.0–5.0)
Cholesterol, Total: 132 mg/dL (ref 100–199)
HDL: 34 mg/dL — ABNORMAL LOW (ref >39)
LDL Chol Calc (NIH): 60 mg/dL (ref 0–99)
Triglycerides: 232 mg/dL — ABNORMAL HIGH (ref 0–149)
VLDL Cholesterol Cal: 38 mg/dL (ref 5–40)

## 2023-06-11 LAB — HEMOGLOBIN A1C
Est. average glucose Bld gHb Est-mCnc: 183 mg/dL
Hgb A1c MFr Bld: 8 % — ABNORMAL HIGH (ref 4.8–5.6)

## 2023-06-11 LAB — TSH: TSH: 2.26 u[IU]/mL (ref 0.450–4.500)

## 2023-06-11 NOTE — Progress Notes (Signed)
 Please, send metformin/glucophage extended release 500mg , # 60 1 refill, take 1 tablet twice daily with meals

## 2023-06-14 NOTE — Progress Notes (Incomplete)
 Complete physical exam  Patient: Victor Alvarez   DOB: 04-05-1960   63 y.o. Male  MRN: [id number] Visit Date: 06/10/2023  Today's healthcare provider: Blane Bunting, PA-C   Chief Complaint  Patient presents with  . Annual Exam    CPE.leg weakness both , Kidnery stones   Subjective    Victor Alvarez is a 63 y.o. male who presents today for a complete physical exam.   Discussed the use of AI scribe software for clinical note transcription with the patient, who gave verbal consent to proceed.  History of Present Illness Victor Alvarez is a 63 year old male who presents with dizziness and weakness.  He experiences dizziness, especially when standing, with associated leg weakness affecting balance and ambulation. Blood pressure was recently 114/71 with a heart rate of 80. He uses a sleep machine for sleep apnea, having started two days ago. Diabetes is managed through diet with noted improvement in memory. He takes Vraylar , recently increased to 4.5 mg, with good behavioral health support. Smoking is reduced to half a pack daily, and alcohol is consumed occasionally. He experiences frequent bloating linked to diet, without heartburn or sore taste. Shortness of breath occurs even at rest, attributed to a lung condition. He has not had recent eye exams or blood work. He had knee surgery last year, with persistent leg weakness and numbness since 3-5 months post-surgery.    Last depression screening scores    06/10/2023    8:25 AM 05/05/2023   11:37 PM 04/29/2023    9:38 AM  PHQ 2/9 Scores  PHQ - 2 Score 3 2 2   PHQ- 9 Score 11  6   Last fall risk screening    06/10/2023    8:21 AM  Fall Risk   Falls in the past year? 1  Number falls in past yr: 1  Injury with Fall? 0  Follow up Falls evaluation completed   Last Audit-C alcohol use screening    04/29/2023    9:28 AM  Alcohol Use Disorder Test (AUDIT)  1. How often do you have a drink containing alcohol? 0  2. How many drinks containing  alcohol do you have on a typical day when you are drinking? 0  3. How often do you have six or more drinks on one occasion? 0  AUDIT-C Score 0   A score of 3 or more in women, and 4 or more in men indicates increased risk for alcohol abuse, EXCEPT if all of the points are from question 1   Past Medical History:  Diagnosis Date  . Acute appendicitis 08/24/2019  . Allergy   . Arthritis   . Bipolar affective disorder (HCC)   . Colon polyp   . COPD (chronic obstructive pulmonary disease) (HCC)   . Depression   . GERD (gastroesophageal reflux disease)   . Hemorrhagic shock (HCC) 02/16/2023  . Hemorrhoids   . Hypercholesterolemia   . Mixed hyperlipidemia   . Neuromuscular disorder (HCC)   . PTSD (post-traumatic stress disorder)   . Right wrist fracture 03/25/2015  . Sleep apnea   . Type 2 diabetes mellitus without complication, without long-term current use of insulin  [facility])    Past Surgical History:  Procedure Laterality Date  . CATARACT EXTRACTION W/ INTRAOCULAR LENS IMPLANT Right   . COLONOSCOPY WITH PROPOFOL  N/A 01/18/2023   Procedure: COLONOSCOPY WITH PROPOFOL ;  Surgeon: Luke Salaam, MD;  Location: [facility] ENDOSCOPY;  Service: Gastroenterology;  Laterality: N/A;  . HEMORRHOID SURGERY N/A 02/10/2023  Procedure: HEMORRHOIDECTOMY;  Surgeon: Flynn Hylan, MD;  Location: [facility] ORS;  Service: General;  Laterality: N/A;  surgifoam placed  . HEMOSTASIS CLIP PLACEMENT  01/18/2023   Procedure: HEMOSTASIS CLIP PLACEMENT;  Surgeon: Luke Salaam, MD;  [facility];  Service: Gastroenterology;;  . MINOR FULGERATION OF ANAL CONDYLOMA N/A 02/10/2023   Procedure: MINOR FULGERATION OF ANAL CONDYLOMA;  Surgeon: Flynn Hylan, MD;  Location: [facility] ORS;  Service: General;  Laterality: N/A;  . POLYPECTOMY  01/18/2023   Procedure: POLYPECTOMY;  Surgeon: Luke Salaam, MD;  Location: [facility] ENDOSCOPY;  Service: Gastroenterology;;  . RECTAL EXAM UNDER ANESTHESIA N/A 02/10/2023   Procedure: RECTAL  EXAM UNDER ANESTHESIA;  Surgeon: Flynn Hylan, MD;  Location: [facility] ORS;  Service: General;  Laterality: N/A;  . RECTAL EXAM UNDER ANESTHESIA N/A 02/16/2023   Procedure: RECTAL EXAM UNDER ANESTHESIA;  Surgeon: Alben Alma, MD;  Location: [facility] ORS;  Service: General;  Laterality: N/A;  . WRIST FRACTURE SURGERY Right 04/11/2015  . XI ROBOTIC LAPAROSCOPIC ASSISTED APPENDECTOMY N/A 08/24/2019   Procedure: XI ROBOTIC LAPAROSCOPIC ASSISTED APPENDECTOMY;  Surgeon: Flynn Hylan, MD;  Location: [facility] ORS;  Service: General;  Laterality: N/A;   Social History   Socioeconomic History  . Marital status: Single    Spouse name: Not on file  . Number of children: 1  . Years of education: Not on file  . Highest education level: Not on file  Occupational History  . Not on file  Tobacco Use  . Smoking status: Every Day    Current packs/day: 1.00    Average packs/day: 1 pack/day for 51.4 years (51.4 ttl pk-yrs)    Types: Cigarettes    Start date: 73  . Smokeless tobacco: Former    Types: Chew  . Tobacco comments:    0.5PPD  Vaping Use  . Vaping status: Former  Substance and Sexual Activity  . Alcohol use: Not Currently  . Drug use: Not Currently    Types: Marijuana    Comment: last  used 2-3 weeks ago  . Sexual activity: Not on file  Other Topics Concern  . Not on file  Social History Narrative   Lives alone   Social Drivers of Health   Financial Resource Strain: Low Risk  (05/05/2023)   Overall Financial Resource Strain (CARDIA)   . Difficulty of Paying Living Expenses: Not very hard  Food Insecurity: No Food Insecurity (05/05/2023)   Hunger Vital Sign   . Worried About Programme researcher, broadcasting/film/video in the Last Year: Never true   . Ran Out of Food in the Last Year: Never true  Transportation Needs: No Transportation Needs (05/05/2023)   PRAPARE - Transportation   . Lack of Transportation (Medical): No   . Lack of Transportation (Non-Medical): No  Physical Activity: Not on file  Stress:  Stress Concern Present (04/29/2023)   [employer] of Occupational Health - Occupational Stress Questionnaire   . Feeling of Stress : To some extent  Social Connections: Not on file  Intimate Partner Violence: Not At Risk (05/05/2023)   Humiliation, Afraid, Rape, and Kick questionnaire   . Fear of Current or Ex-Partner: No   . Emotionally Abused: No   . Physically Abused: No   . Sexually Abused: No   Family Status  Relation Name Status  . Mother  Deceased  . Father  Deceased  . MGM  (Not Specified)  . PGF  (Not Specified)  No partnership data on file   Family History  Problem Relation Age of  Onset  . Heart disease Father 39  . Heart attack Maternal Grandmother   . Heart disease Paternal Grandfather    Allergies  Allergen Reactions  . Codeine Nausea And Vomiting, Other (See Comments) and Nausea Only  . Metformin Other (See Comments)    unsure    Patient Care Team: Eligha Kmetz, PA-C as PCP - General (Physician Assistant) End, Veryl Gottron, MD as PCP - Cardiology (Cardiology) Marcelle Sergeant as Psychiatrist (Psychiatry)   Medications: Outpatient Medications Prior to Visit  Medication Sig  . Accu-Chek Softclix Lancets lancets Use as instructed to check blood glucose daily. Dx E11.9  . albuterol  (VENTOLIN  HFA) 108 (90 Base) MCG/ACT inhaler Inhale 2 puffs into the lungs every 6 (six) hours as needed for wheezing or shortness of breath.  . aspirin  81 MG EC tablet Take 81 mg by mouth daily.  . B Complex-C (B-COMPLEX WITH VITAMIN C) tablet Take 1 tablet by mouth daily.  . Blood Glucose Monitoring Suppl (ONE TOUCH ULTRA 2) w/Device KIT Use to check blood sugar daily. Dx E11.9  . cariprazine  (VRAYLAR ) 3 MG capsule Take 1 capsule (3 mg total) by mouth daily.  . cetirizine  (ZYRTEC ) 10 MG chewable tablet Chew 1 tablet (10 mg total) by mouth daily.  . cetirizine  (ZYRTEC ) 10 MG tablet Take 1 tablet (10 mg total) by mouth daily.  . Cholecalciferol (VITAMIN D3) 50 MCG (2000 UT) capsule  Take 1 capsule (2,000 Units total) by mouth daily.  . escitalopram  (LEXAPRO ) 20 MG tablet Take 20 mg by mouth daily.  . fluticasone  (FLONASE ) 50 MCG/ACT nasal spray Place 2 sprays into both nostrils daily. sometimes uses in the evening as needed  . glucose blood (ONETOUCH ULTRA TEST) test strip Use as instructed to check blood glucose daily. Dx E11.9  . ibuprofen  (ADVIL ) 800 MG tablet Take 1 tablet (800 mg total) by mouth every 8 (eight) hours as needed.  . metoprolol  succinate (TOPROL -XL) 25 MG 24 hr tablet Take 1 tablet (25 mg total) by mouth daily.  . omeprazole  (PRILOSEC) 40 MG capsule Take 1 capsule (40 mg total) by mouth daily.  . prazosin  (MINIPRESS ) 1 MG capsule Take 2 mg by mouth at bedtime.  . rosuvastatin  (CRESTOR ) 20 MG tablet Take 1 tablet (20 mg total) by mouth daily.  . Tiotropium Bromide-Olodaterol (STIOLTO RESPIMAT ) 2.5-2.5 MCG/ACT AERS Inhale 2 puffs into the lungs daily.  . trazodone  (DESYREL ) 300 MG tablet Take 300 mg by mouth at bedtime.   No facility-administered medications prior to visit.    Review of Systems  All other systems reviewed and are negative.  Except see HPI  {Insert previous labs (optional):23779} {See past labs  Heme  Chem  Endocrine  Serology  Results Review (optional):1}  Objective    BP 110/68 (BP Location: Right Arm, Patient Position: Sitting)   Pulse 84   Resp 16   Ht 5\' 10"  (1.778 m)   Wt 222 lb 14.4 oz (101.1 kg)   SpO2 98%   BMI 31.98 kg/m  {Insert last BP/Wt (optional):23777}{See vitals history (optional):1}    Physical Exam Vitals reviewed.  Constitutional:      General: He is not in acute distress.    Appearance: Normal appearance. He is well-developed. He is not ill-appearing, toxic-appearing or diaphoretic.  HENT:     Head: Normocephalic and atraumatic.     Right Ear: Tympanic membrane, ear canal and external ear normal.     Left Ear: Tympanic membrane, ear canal and external ear normal.     Nose:  Nose normal. No  congestion or rhinorrhea.     Mouth/Throat:     Mouth: Mucous membranes are moist.     Pharynx: Oropharynx is clear. No oropharyngeal exudate.  Eyes:     General: No scleral icterus.       Right eye: No discharge.        Left eye: No discharge.     Conjunctiva/sclera: Conjunctivae normal.     Pupils: Pupils are equal, round, and reactive to light.  Neck:     Thyroid: No thyromegaly.     Vascular: No carotid bruit.  Cardiovascular:     Rate and Rhythm: Normal rate and regular rhythm.     Pulses: Normal pulses.     Heart sounds: Normal heart sounds. No murmur heard.    No friction rub. No gallop.  Pulmonary:     Effort: Pulmonary effort is normal. No respiratory distress.     Breath sounds: Normal breath sounds. No wheezing or rales.  Abdominal:     General: Abdomen is flat. Bowel sounds are normal. There is no distension.     Palpations: Abdomen is soft. There is no mass.     Tenderness: There is no abdominal tenderness. There is no right CVA tenderness, left CVA tenderness, guarding or rebound.     Hernia: No hernia is present.  Musculoskeletal:        General: No swelling, tenderness, deformity or signs of injury. Normal range of motion.     Cervical back: Normal range of motion and neck supple. No rigidity or tenderness.     Right lower leg: No edema.     Left lower leg: No edema.  Lymphadenopathy:     Cervical: No cervical adenopathy.  Skin:    General: Skin is warm and dry.     Coloration: Skin is not jaundiced or pale.     Findings: No bruising, erythema, lesion or rash.  Neurological:     Mental Status: He is alert and oriented to person, place, and time. Mental status is at baseline.     Gait: Gait normal.  Psychiatric:        Mood and Affect: Mood normal.        Behavior: Behavior normal.        Thought Content: Thought content normal.        Judgment: Judgment normal.      Results for orders placed or performed in visit on 06/10/23  Comprehensive metabolic  panel with GFR  Result Value Ref Range   Glucose 132 (H) 70 - 99 mg/dL   BUN 7 (L) 8 - 27 mg/dL   Creatinine, Ser 1.61 0.76 - 1.27 mg/dL   eGFR 96 >09 UE/AVW/0.98   BUN/Creatinine Ratio 8 (L) 10 - 24   Sodium 141 134 - 144 mmol/L   Potassium 5.1 3.5 - 5.2 mmol/L   Chloride 101 96 - 106 mmol/L   CO2 28 20 - 29 mmol/L   Calcium  10.2 8.6 - 10.2 mg/dL   Total Protein 6.1 6.0 - 8.5 g/dL   Albumin 4.0 3.9 - 4.9 g/dL   Globulin, Total 2.1 1.5 - 4.5 g/dL   Bilirubin Total <1.1 0.0 - 1.2 mg/dL   Alkaline Phosphatase 123 (H) 44 - 121 IU/L   AST 17 0 - 40 IU/L   ALT 28 0 - 44 IU/L  CBC with Differential/Platelet  Result Value Ref Range   WBC 8.8 3.4 - 10.8 x10E3/uL   RBC 5.61 4.14 - 5.80 x10E6/uL  Hemoglobin 13.9 13.0 - 17.7 g/dL   Hematocrit 86.5 78.4 - 51.0 %   MCV 82 79 - 97 fL   MCH 24.8 (L) 26.6 - 33.0 pg   MCHC 30.1 (L) 31.5 - 35.7 g/dL   RDW 69.6 29.5 - 28.4 %   Platelets 466 (H) 150 - 450 x10E3/uL   Neutrophils 62 Not Estab. %   Lymphs 22 Not Estab. %   Monocytes 10 Not Estab. %   Eos 4 Not Estab. %   Basos 1 Not Estab. %   Neutrophils Absolute 5.5 1.4 - 7.0 x10E3/uL   Lymphocytes Absolute 1.9 0.7 - 3.1 x10E3/uL   Monocytes Absolute 0.9 0.1 - 0.9 x10E3/uL   EOS (ABSOLUTE) 0.4 0.0 - 0.4 x10E3/uL   Basophils Absolute 0.1 0.0 - 0.2 x10E3/uL   Immature Granulocytes 1 Not Estab. %   Immature Grans (Abs) 0.1 0.0 - 0.1 x10E3/uL  Hemoglobin A1c  Result Value Ref Range   Hgb A1c MFr Bld 8.0 (H) 4.8 - 5.6 %   Est. average glucose Bld gHb Est-mCnc 183 mg/dL  Lipid panel  Result Value Ref Range   Cholesterol, Total 132 100 - 199 mg/dL   Triglycerides 132 (H) 0 - 149 mg/dL   HDL 34 (L) >44 mg/dL   VLDL Cholesterol Cal 38 5 - 40 mg/dL   LDL Chol Calc (NIH) 60 0 - 99 mg/dL   Chol/HDL Ratio 3.9 0.0 - 5.0 ratio  TSH  Result Value Ref Range   TSH 2.260 0.450 - 4.500 uIU/mL    Assessment & Plan    Routine Health Maintenance and Physical Exam  Exercise Activities and Dietary  recommendations  Goals     . Monitor, Self-Manage And Reduce Symptoms of Chronic Obstructive Pulmonary Disease     Current Barriers:  Chronic Disease Management support and education needs related to COPD  Planned Interventions: Provided patient with basic written and verbal COPD education on self care/management/and exacerbation prevention Reviewed medications and importance of compliance. Reports taking medications as prescribed. Advised to call with concerns related to medication management or prescription cost. Advised patient to track and manage COPD triggers. Reports symptoms have been well controlled. Reports not requiring oxygen therapy. Advised patient to self assesses COPD action plan zone and make appointment with provider if in the yellow zone for 48 hours without improvement Provided education about and advised patient to utilize infection prevention strategies to reduce risk of respiratory infection Discussed the importance of adequate rest and management of fatigue with COPD Provided information regarding safety and fall prevention measures Discussed plan following Sleep Study on April 06, 2023. He reports using a CPAP in the past but the device is currently damaged. Will contact his Pulmonology provider/Dr. Darnelle Elders regarding plan. Assessed social determinant of health barriers  Symptom Management: Take medications as prescribed   Attend all scheduled provider appointments Call pharmacy for medication refills 3-7 days in advance of running out of medications Eliminate symptom triggers at home Follow rescue plan if symptoms flare-up Call provider office for new concerns or questions          . Vaccines     Current Barriers:  Care Coordination needs related to obtaining vaccines.  Planned Interventions: Message received from provider regarding assistance with obtaining vaccine. Patient reports interest in obtaining HPV vaccination. Notes he has not completed the  Shingrix series and also interested in obtaining this vaccine. Notes possible changes with insurance copays and unsure where these vaccines can be obtained at low or  no cost. Will follow up via conference call with insurance provider to confirm and schedule vaccines.         Immunization History  Administered Date(s) Administered  . Fluzone Influenza virus vaccine,trivalent (IIV3), split virus 11/19/2015  . Hep A / Hep B 11/28/2008, 01/01/2009, 06/04/2009  . Influenza, Quadrivalent, Recombinant, Inj, Pf 10/29/2020  . Influenza, Seasonal, Injecte, Preservative Fre 11/02/2017  . Influenza,inj,Quad PF,6+ Mos 09/28/2016, 12/02/2021  . Influenza-Unspecified 11/28/2008  . Moderna Covid-19 Vaccine Bivalent Booster 26yrs & up 11/12/2020  . Moderna Sars-Covid-2 Vaccination 08/13/2019, 09/14/2019  . PNEUMOCOCCAL CONJUGATE-20 04/29/2023  . Pneumococcal Polysaccharide-23 11/05/2016  . Tdap 11/28/2008, 12/20/2013  . Zoster Recombinant(Shingrix) 08/06/2021    Health Maintenance  Topic Date Due  . Complete foot exam   Never done  . Eye exam for diabetics  Never done  . Hepatitis C Screening  Never done  . Zoster (Shingles) Vaccine (2 of 2) 10/01/2021  . COVID-19 Vaccine (4 - 2024-25 season) 10/04/2022  . Flu Shot  09/03/2023  . Hemoglobin A1C  12/11/2023  . DTaP/Tdap/Td vaccine (3 - Td or Tdap) 12/21/2023  . Yearly kidney health urinalysis for diabetes  01/08/2024  . Colon Cancer Screening  01/18/2024  . Screening for Lung Cancer  01/20/2024  . Yearly kidney function blood test for diabetes  06/09/2024  . Medicare Annual Wellness Visit  06/09/2024  . Pneumonia Vaccine  Completed  . HIV Screening  Completed  . HPV Vaccine  Aged Out  . Meningitis B Vaccine  Aged Out    Discussed health benefits of physical activity, and encouraged him to engage in regular exercise appropriate for his age and condition.  Assessment & Plan Annual physical exam UTD on dental/eye Things to do to keep  yourself healthy  - Exercise at least 30-45 minutes a day, 3-4 days a week.  - Eat a low-fat diet with lots of fruits and vegetables, up to 7-9 servings per day.  - Seatbelts can save your life. Wear them always.  - Smoke detectors on every level of your home, check batteries every year.  - Eye Doctor - have an eye exam every 1-2 years  - Safe sex - if you may be exposed to STDs, use a condom.  - Alcohol -  If you drink, do it moderately, less than 2 drinks per day.  - Health Care Power of Attorney. Choose someone to speak for you if you are not able.  - Depression is common in our stressful world.If you're feeling down or losing interest in things you normally enjoy, please come in for a visit.  - Violence - If anyone is threatening or hurting you, please call immediately.  - Comprehensive metabolic panel with GFR - CBC with Differential/Platelet - Hemoglobin A1c - Lipid panel - TSH  Memory problem Will reassess at the follow-up   Type 2 diabetes mellitus without complication, without long-term current use of insulin  (HCC) *** - Comprehensive metabolic panel with GFR - CBC with Differential/Platelet - Hemoglobin A1c - Lipid panel - TSH  Depression, recurrent (HCC) *** - Comprehensive metabolic panel with GFR - CBC with Differential/Platelet - Hemoglobin A1c - Lipid panel - TSH   Initial Medicare annual wellness visit (Primary) ***  Encounter for complete eye exam *** - Ambulatory referral to Ophthalmology  Chronic pain of right knee *** - Ambulatory referral to Orthopedics  Renal pain *** - Urinalysis, Routine w reflex microscopic Shortness of breath Chronic shortness of breath likely due to pulmonary issues. Managed by  pulmonologist. - Continue follow-up with pulmonologist.  Dizziness Dizziness, especially on standing, possibly due to orthostatic hypotension or medication changes. Recent Vraylar  dosage change noted. - Perform blood work to investigate  causes. - Review medication list at each visit for side effects.  Kidney stone Right kidney stone present, non-obstructive. Pain and nausea reported despite imaging findings. - Consider urology referral if symptoms persist.  Viral condyloma Viral condyloma present. Discussed treatment options including topical treatments and surgical removal. - Reschedule dermatology appointment for evaluation and management.  Diabetes Diabetes managed with dietary modifications. No medications currently.  Depression Depression well-managed with Behavioral Health and Vraylar . Recent dosage increase to 4.5 mg. - Continue management with Behavioral Health. - Ensure medication list is brought to each visit.  Sleep apnea Recently started CPAP therapy. Efficacy not yet determined.   No follow-ups on file.    The patient was advised to call back or seek an in-person evaluation if the symptoms worsen or if the condition fails to improve as anticipated.  I discussed the assessment and treatment plan with the patient. The patient was provided an opportunity to ask questions and all were answered. The patient agreed with the plan and demonstrated an understanding of the instructions.  I, Talyn Dessert, PA-C have reviewed all documentation for this visit. The documentation on 06/10/2023  for the exam, diagnosis, procedures, and orders are all accurate and complete.  Blane Bunting, [facility], MMS [facility] [phone] (phone) [phone] (fax)  [facility]

## 2023-06-16 DIAGNOSIS — E119 Type 2 diabetes mellitus without complications: Secondary | ICD-10-CM | POA: Diagnosis not present

## 2023-06-16 DIAGNOSIS — Z961 Presence of intraocular lens: Secondary | ICD-10-CM | POA: Diagnosis not present

## 2023-06-16 DIAGNOSIS — H2512 Age-related nuclear cataract, left eye: Secondary | ICD-10-CM | POA: Diagnosis not present

## 2023-06-16 LAB — HM DIABETES EYE EXAM

## 2023-06-18 DIAGNOSIS — H5213 Myopia, bilateral: Secondary | ICD-10-CM | POA: Diagnosis not present

## 2023-06-29 NOTE — Telephone Encounter (Signed)
 Please see the message from the pt

## 2023-06-30 ENCOUNTER — Other Ambulatory Visit: Payer: Self-pay

## 2023-06-30 MED ORDER — METFORMIN HCL 500 MG PO TABS
500.0000 mg | ORAL_TABLET | Freq: Two times a day (BID) | ORAL | 1 refills | Status: DC
Start: 2023-06-30 — End: 2023-08-30

## 2023-07-12 ENCOUNTER — Ambulatory Visit: Admitting: Student in an Organized Health Care Education/Training Program

## 2023-07-12 ENCOUNTER — Other Ambulatory Visit: Payer: Self-pay

## 2023-07-12 DIAGNOSIS — J398 Other specified diseases of upper respiratory tract: Secondary | ICD-10-CM

## 2023-07-12 DIAGNOSIS — J439 Emphysema, unspecified: Secondary | ICD-10-CM

## 2023-07-12 DIAGNOSIS — R0602 Shortness of breath: Secondary | ICD-10-CM

## 2023-07-12 LAB — PULMONARY FUNCTION TEST
DL/VA % pred: 89 %
DL/VA: 3.68 ml/min/mmHg/L
DLCO unc % pred: 85 %
DLCO unc: 22.76 ml/min/mmHg
FEF 25-75 Pre: 1.89 L/s
FEF2575-%Pred-Pre: 69 %
FEV1-%Pred-Pre: 80 %
FEV1-Pre: 2.76 L
FEV1FVC-%Pred-Pre: 94 %
FEV6-%Pred-Pre: 88 %
FEV6-Pre: 3.86 L
FEV6FVC-%Pred-Pre: 104 %
FVC-%Pred-Pre: 84 %
FVC-Pre: 3.91 L
Pre FEV1/FVC ratio: 71 %
Pre FEV6/FVC Ratio: 99 %
RV % pred: 129 %
RV: 3.03 L
TLC % pred: 100 %
TLC: 7.03 L

## 2023-07-12 NOTE — Progress Notes (Addendum)
 Full PFT completed today without post. Pt was near passing out.

## 2023-07-12 NOTE — Patient Instructions (Signed)
 Full PFT completed today ? ?

## 2023-07-19 ENCOUNTER — Ambulatory Visit (INDEPENDENT_AMBULATORY_CARE_PROVIDER_SITE_OTHER): Payer: MEDICAID | Admitting: Student in an Organized Health Care Education/Training Program

## 2023-07-19 ENCOUNTER — Encounter: Payer: Self-pay | Admitting: Student in an Organized Health Care Education/Training Program

## 2023-07-19 VITALS — BP 112/60 | HR 79 | Temp 97.3°F | Ht 70.0 in | Wt 222.4 lb

## 2023-07-19 DIAGNOSIS — J439 Emphysema, unspecified: Secondary | ICD-10-CM

## 2023-07-19 DIAGNOSIS — F17200 Nicotine dependence, unspecified, uncomplicated: Secondary | ICD-10-CM | POA: Diagnosis not present

## 2023-07-19 DIAGNOSIS — J398 Other specified diseases of upper respiratory tract: Secondary | ICD-10-CM | POA: Diagnosis not present

## 2023-07-19 DIAGNOSIS — E66811 Obesity, class 1: Secondary | ICD-10-CM | POA: Diagnosis not present

## 2023-07-19 DIAGNOSIS — G4733 Obstructive sleep apnea (adult) (pediatric): Secondary | ICD-10-CM

## 2023-07-19 NOTE — Progress Notes (Unsigned)
 Synopsis: Referred in *** by Victor Bunting, PA-C  Assessment & Plan:   1. Tracheobronchomalacia (Primary) *** - Ambulatory referral to Cardiothoracic Surgery  Still occasional cough and mild SOB with exertion. Compliant with inhalers. Also started on CPAP for OSA (follows with Dr. Kieran Alvarez from sleep). PFT's actually within normal and re-assuring. HRCT with TBM > will refer to thoracic surgery at [facility] for evaluation of tracheobronchoplasty.  No follow-ups on file.  I spent *** minutes caring for this patient today, including {EM billing:[id number]}  Victor Glasgow, MD [facility] 07/19/2023 10:10 AM    End of visit medications:  No orders of the defined types were placed in this encounter.    Current Outpatient Medications:    Accu-Chek Softclix Lancets lancets, Use as instructed to check blood glucose daily. Dx E11.9, Disp: 100 each, Rfl: 12   albuterol  (VENTOLIN  HFA) 108 (90 Base) MCG/ACT inhaler, Inhale 2 puffs into the lungs every 6 (six) hours as needed for wheezing or shortness of breath., Disp: 6.7 g, Rfl: 5   aspirin  81 MG EC tablet, Take 81 mg by mouth daily., Disp: , Rfl:    B Complex-C (B-COMPLEX WITH VITAMIN C) tablet, Take 1 tablet by mouth daily., Disp: , Rfl:    Blood Glucose Monitoring Suppl (ONE TOUCH ULTRA 2) w/Device KIT, Use to check blood sugar daily. Dx E11.9, Disp: 1 kit, Rfl: 0   cariprazine  (VRAYLAR ) 3 MG capsule, Take 1 capsule (3 mg total) by mouth daily., Disp: 30 capsule, Rfl: 0   cetirizine  (ZYRTEC ) 10 MG chewable tablet, Chew 1 tablet (10 mg total) by mouth daily., Disp: 90 tablet, Rfl: 1   cetirizine  (ZYRTEC ) 10 MG tablet, Take 1 tablet (10 mg total) by mouth daily., Disp: 30 tablet, Rfl: 11   Cholecalciferol (VITAMIN D3) 50 MCG (2000 UT) capsule, Take 1 capsule (2,000 Units total) by mouth daily., Disp: 90 capsule, Rfl: 3   escitalopram  (LEXAPRO ) 20 MG tablet, Take 20 mg by mouth daily., Disp: , Rfl:    fluticasone  (FLONASE ) 50  MCG/ACT nasal spray, Place 2 sprays into both nostrils daily. sometimes uses in the evening as needed, Disp: 16 g, Rfl: 5   glucose blood (ONETOUCH ULTRA TEST) test strip, Use as instructed to check blood glucose daily. Dx E11.9, Disp: 100 each, Rfl: 12   ibuprofen  (ADVIL ) 800 MG tablet, Take 1 tablet (800 mg total) by mouth every 8 (eight) hours as needed., Disp: 30 tablet, Rfl: 0   metFORMIN  (GLUCOPHAGE ) 500 MG tablet, Take 1 tablet (500 mg total) by mouth 2 (two) times daily with a meal., Disp: 60 tablet, Rfl: 1   metoprolol  succinate (TOPROL -XL) 25 MG 24 hr tablet, Take 1 tablet (25 mg total) by mouth daily., Disp: 30 tablet, Rfl: 5   omeprazole  (PRILOSEC) 40 MG capsule, Take 1 capsule (40 mg total) by mouth daily., Disp: 30 capsule, Rfl: 5   prazosin  (MINIPRESS ) 1 MG capsule, Take 2 mg by mouth at bedtime., Disp: , Rfl:    rosuvastatin  (CRESTOR ) 20 MG tablet, Take 1 tablet (20 mg total) by mouth daily., Disp: 90 tablet, Rfl: 1   Tiotropium Bromide-Olodaterol (STIOLTO RESPIMAT ) 2.5-2.5 MCG/ACT AERS, Inhale 2 puffs into the lungs daily., Disp: 60 each, Rfl: 5   trazodone  (DESYREL ) 300 MG tablet, Take 300 mg by mouth at bedtime., Disp: , Rfl:    Subjective:   PATIENT ID: Victor Alvarez GENDER: male DOB: [birth year], MRN: [id number]  Chief Complaint  Patient presents with   Follow-up  SOB. No wheezing. Little cough with clear sputum.    HPI ***  Ancillary information including prior medications, full medical/surgical/family/social histories, and PFTs (when available) are listed below and have been reviewed.   ROS   Objective:   Vitals:   07/19/23 0946  BP: 112/60  Pulse: 79  Temp: (!) 97.3 F (36.3 C)  SpO2: 95%  Weight: 222 lb 6.4 oz (100.9 kg)  Height: 5' 10 (1.778 m)   95% on *** LPM *** RA BMI Readings from Last 3 Encounters:  07/19/23 31.91 kg/m  07/12/23 32.05 kg/m  06/10/23 31.98 kg/m   Wt Readings from Last 3 Encounters:  07/19/23 222 lb 6.4 oz (100.9 kg)   07/12/23 223 lb 6.4 oz (101.3 kg)  06/10/23 222 lb 14.4 oz (101.1 kg)    Physical Exam    Ancillary Information    Past Medical History:  Diagnosis Date   Acute appendicitis 08/24/2019   Allergy    Arthritis    Bipolar affective disorder (HCC)    Colon polyp    COPD (chronic obstructive pulmonary disease) (HCC)    Depression    GERD (gastroesophageal reflux disease)    Hemorrhagic shock (HCC) 02/16/2023   Hemorrhoids    Hypercholesterolemia    Mixed hyperlipidemia    Neuromuscular disorder (HCC)    PTSD (post-traumatic stress disorder)    Right wrist fracture 03/25/2015   Sleep apnea    Type 2 diabetes mellitus without complication, without long-term current use of insulin  (HCC)      Family History  Problem Relation Age of Onset   Heart disease Father 80   Heart attack Maternal Grandmother    Heart disease Paternal Grandfather      Past Surgical History:  Procedure Laterality Date   CATARACT EXTRACTION W/ INTRAOCULAR LENS IMPLANT Right    COLONOSCOPY WITH PROPOFOL  N/A 01/18/2023   Procedure: COLONOSCOPY WITH PROPOFOL ;  Surgeon: Luke Salaam, MD;  Location: [facility] END[facility]COPY;  Service: Gastroenterology;  Laterality: N/A;   HEMORRHOID SURGERY N/A 02/10/2023   Procedure: HEMORRHOIDECTOMY;  Surgeon: Flynn Hylan, MD;  Location: [facility] ORS;  Service: General;  Laterality: N/A;  surgifoam placed   HEMOSTASIS CLIP PLACEMENT  01/18/2023   Procedure: HEMOSTASIS CLIP PLACEMENT;  Surgeon: Luke Salaam, MD;  Location: [facility] ENDOSCOPY;  Service: Gastroenterology;;   MINOR FULGERATION OF ANAL CONDYLOMA N/A 02/10/2023   Procedure: MINOR FULGERATION OF ANAL CONDYLOMA;  Surgeon: Flynn Hylan, MD;  Location: [facility] ORS;  Service: General;  Laterality: N/A;   POLYPECTOMY  01/18/2023   Procedure: POLYPECTOMY;  Surgeon: Luke Salaam, MD;  Location: [facility] ENDOSCOPY;  Service: Gastroenterology;;   RECTAL EXAM UNDER ANESTHESIA N/A 02/10/2023   Procedure: RECTAL EXAM UNDER ANESTHESIA;  Surgeon:  Flynn Hylan, MD;  Location: [facility] ORS;  Service: General;  Laterality: N/A;   RECTAL EXAM UNDER ANESTHESIA N/A 02/16/2023   Procedure: RECTAL EXAM UNDER ANESTHESIA;  Surgeon: Alben Alma, MD;  Location: [facility] ORS;  Service: General;  Laterality: N/A;   WRIST FRACTURE SURGERY Right 04/11/2015   XI ROBOTIC LAPAROSCOPIC ASSISTED APPENDECTOMY N/A 08/24/2019   Procedure: XI ROBOTIC LAPAROSCOPIC ASSISTED APPENDECTOMY;  Surgeon: Flynn Hylan, MD;  Location: [facility] ORS;  Service: General;  Laterality: N/A;    Social History   Socioeconomic History   Marital status: Single    Spouse name: Not on file   Number of children: 1   Years of education: Not on file   Highest education level: Not on file  Occupational History   Not on file  Tobacco  Use   Smoking status: Every Day    Current packs/day: 1.00    Average packs/day: 1 pack/day for 51.5 years (51.5 ttl pk-yrs)    Types: Cigarettes    Start date: 1974   Smokeless tobacco: Former    Types: Chew   Tobacco comments:    0.5 PPD- khj 07/19/2023        Started smoking at 63 years old    Smoked 1PPD at his heaviest  Vaping Use   Vaping status: Former  Substance and Sexual Activity   Alcohol use: Not Currently   Drug use: Not Currently    Types: Marijuana    Comment: last  used 2-3 weeks ago   Sexual activity: Not on file  Other Topics Concern   Not on file  Social History Narrative   Lives alone   Social Drivers of Health   Financial Resource Strain: Low Risk  (05/05/2023)   Overall Financial Resource Strain (CARDIA)    Difficulty of Paying Living Expenses: Not very hard  Food Insecurity: No Food Insecurity (05/05/2023)   Hunger Vital Sign    Worried About Running Out of Food in the Last Year: Never true    Ran Out of Food in the Last Year: Never true  Transportation Needs: No Transportation Needs (05/05/2023)   PRAPARE - Administrator, Civil Service (Medical): No    Lack of Transportation (Non-Medical): No   Physical Activity: Not on file  Stress: Stress Concern Present (04/29/2023)   [employer] of Occupational Health - Occupational Stress Questionnaire    Feeling of Stress : To some extent  Social Connections: Not on file  Intimate Partner Violence: Not At Risk (05/05/2023)   Humiliation, Afraid, Rape, and Kick questionnaire    Fear of Current or Ex-Partner: No    Emotionally Abused: No    Physically Abused: No    Sexually Abused: No     Allergies  Allergen Reactions   Codeine Nausea And Vomiting, Other (See Comments) and Nausea Only   Metformin  Other (See Comments)    unsure     CBC    Component Value Date/Time   WBC 8.8 06/10/2023 0908   WBC 15.2 (H) 02/18/2023 0421   RBC 5.61 06/10/2023 0908   RBC 2.64 (L) 02/18/2023 0421   HGB 13.9 06/10/2023 0908   HCT 46.2 06/10/2023 0908   PLT 466 (H) 06/10/2023 0908   MCV 82 06/10/2023 0908   MCH 24.8 (L) 06/10/2023 0908   MCH 30.3 02/18/2023 0421   MCHC 30.1 (L) 06/10/2023 0908   MCHC 34.0 02/18/2023 0421   RDW 15.3 06/10/2023 0908   LYMPHSABS 1.9 06/10/2023 0908   MONOABS 0.9 02/16/2023 1759   EOSABS 0.4 06/10/2023 0908   BASOSABS 0.1 06/10/2023 0908    Pulmonary Functions Testing Results:    Latest Ref Rng & Units 07/12/2023    8:31 AM  PFT Results  FVC-Pre L 3.91   FVC-Predicted Pre % 84   Pre FEV1/FVC % % 71   FEV1-Pre L 2.76   FEV1-Predicted Pre % 80   DLCO uncorrected ml/min/mmHg 22.76   DLCO UNC% % 85   DLVA Predicted % 89   TLC L 7.03   TLC % Predicted % 100   RV % Predicted % 129     Outpatient Medications Prior to Visit  Medication Sig Dispense Refill   Accu-Chek Softclix Lancets lancets Use as instructed to check blood glucose daily. Dx E11.9 100 each 12   albuterol  (  VENTOLIN  HFA) 108 (90 Base) MCG/ACT inhaler Inhale 2 puffs into the lungs every 6 (six) hours as needed for wheezing or shortness of breath. 6.7 g 5   aspirin  81 MG EC tablet Take 81 mg by mouth daily.     B Complex-C (B-COMPLEX WITH  VITAMIN C) tablet Take 1 tablet by mouth daily.     Blood Glucose Monitoring Suppl (ONE TOUCH ULTRA 2) w/Device KIT Use to check blood sugar daily. Dx E11.9 1 kit 0   cariprazine  (VRAYLAR ) 3 MG capsule Take 1 capsule (3 mg total) by mouth daily. 30 capsule 0   cetirizine  (ZYRTEC ) 10 MG chewable tablet Chew 1 tablet (10 mg total) by mouth daily. 90 tablet 1   cetirizine  (ZYRTEC ) 10 MG tablet Take 1 tablet (10 mg total) by mouth daily. 30 tablet 11   Cholecalciferol (VITAMIN D3) 50 MCG (2000 UT) capsule Take 1 capsule (2,000 Units total) by mouth daily. 90 capsule 3   escitalopram  (LEXAPRO ) 20 MG tablet Take 20 mg by mouth daily.     fluticasone  (FLONASE ) 50 MCG/ACT nasal spray Place 2 sprays into both nostrils daily. sometimes uses in the evening as needed 16 g 5   glucose blood (ONETOUCH ULTRA TEST) test strip Use as instructed to check blood glucose daily. Dx E11.9 100 each 12   ibuprofen  (ADVIL ) 800 MG tablet Take 1 tablet (800 mg total) by mouth every 8 (eight) hours as needed. 30 tablet 0   metFORMIN  (GLUCOPHAGE ) 500 MG tablet Take 1 tablet (500 mg total) by mouth 2 (two) times daily with a meal. 60 tablet 1   metoprolol  succinate (TOPROL -XL) 25 MG 24 hr tablet Take 1 tablet (25 mg total) by mouth daily. 30 tablet 5   omeprazole  (PRILOSEC) 40 MG capsule Take 1 capsule (40 mg total) by mouth daily. 30 capsule 5   prazosin  (MINIPRESS ) 1 MG capsule Take 2 mg by mouth at bedtime.     rosuvastatin  (CRESTOR ) 20 MG tablet Take 1 tablet (20 mg total) by mouth daily. 90 tablet 1   Tiotropium Bromide-Olodaterol (STIOLTO RESPIMAT ) 2.5-2.5 MCG/ACT AERS Inhale 2 puffs into the lungs daily. 60 each 5   trazodone  (DESYREL ) 300 MG tablet Take 300 mg by mouth at bedtime.     No facility-administered medications prior to visit.

## 2023-07-19 NOTE — Progress Notes (Signed)
 Established patient visit  Patient: Victor Alvarez   DOB: 1960-11-14   63 y.o. Male  MRN: [id number] Visit Date: 07/20/2023  Today's healthcare provider: Jolynn Spencer, PA-C   Chief Complaint  Patient presents with   Follow-up    6 wk f/u . Discuss the situation with tracheobronchomalacia/surgery  And some other concerns to address.   Subjective       Discussed the use of AI scribe software for clinical note transcription with the patient, who gave verbal consent to proceed.  History of Present Illness Johannes Everage is a 63 year old male with COPD and diabetes who presents for follow-up on his chronic conditions and medication management.  His COPD is managed with a CPAP machine and inhalers. He experiences shortness of breath during exercise and is compliant with inhaler use. He is referred to cardiothoracic surgery for evaluation of potential tracheal surgery due to a weak windpipe.  He recently started metformin  for diabetes, with a dosage of 500 mg twice daily. His last A1c was 8.0. He consumes sugary beverages, which may contribute to this elevation.  He experiences occasional dizziness, which has improved. His blood pressure is stable.  He has constipation and hemorrhoids, with recent constipation episodes lasting up to five days. He has viral condyloma around the hemorrhoid area, which are not painful. He has a dermatology appointment scheduled for September 4th.  He takes albuterol , rosuvastatin , metoprolol , Flonase , omeprazole , and vitamin D3. He is out of [place] and plans to refill it soon. His triglyceride levels were elevated in his last blood work.  He attends behavioral health meetings weekly and sees his behavioral health provider every two months.       06/10/2023    8:25 AM 05/05/2023   11:37 PM 04/29/2023    9:38 AM  Depression screen PHQ 2/9  Decreased Interest 2 1 1   Down, Depressed, Hopeless 1 1 1   PHQ - 2 Score 3 2 2   Altered sleeping 2  1  Tired, decreased  energy 3  1  Change in appetite 1  0  Feeling bad or failure about yourself  1  1  Trouble concentrating 1  1  Moving slowly or fidgety/restless 0  0  Suicidal thoughts 0  0  PHQ-9 Score 11  6  Difficult doing work/chores Somewhat difficult  Somewhat difficult      06/10/2023    8:25 AM 04/29/2023    9:38 AM 03/11/2023    8:56 AM 02/19/2023    1:10 PM  GAD 7 : Generalized Anxiety Score  Nervous, Anxious, on Edge 1 1 2 3   Control/stop worrying 0 1 2 2   Worry too much - different things 1 1 2 3   Trouble relaxing 1 1 2 3   Restless 1 2 1 2   Easily annoyed or irritable 1 1 1 2   Afraid - awful might happen 1 1 3 3   Total GAD 7 Score 6 8 13 18   Anxiety Difficulty Somewhat difficult Somewhat difficult Somewhat difficult Somewhat difficult    Medications: Outpatient Medications Prior to Visit  Medication Sig   Accu-Chek Softclix Lancets lancets Use as instructed to check blood glucose daily. Dx E11.9   albuterol  (VENTOLIN  HFA) 108 (90 Base) MCG/ACT inhaler Inhale 2 puffs into the lungs every 6 (six) hours as needed for wheezing or shortness of breath.   aspirin  81 MG EC tablet Take 81 mg by mouth daily.   B Complex-C (B-COMPLEX WITH VITAMIN C) tablet Take 1 tablet by mouth daily.  Blood Glucose Monitoring Suppl (ONE TOUCH ULTRA 2) w/Device KIT Use to check blood sugar daily. Dx E11.9   cariprazine  (VRAYLAR ) 3 MG capsule Take 1 capsule (3 mg total) by mouth daily.   cetirizine  (ZYRTEC ) 10 MG chewable tablet Chew 1 tablet (10 mg total) by mouth daily.   cetirizine  (ZYRTEC ) 10 MG tablet Take 1 tablet (10 mg total) by mouth daily.   Cholecalciferol (VITAMIN D3) 50 MCG (2000 UT) capsule Take 1 capsule (2,000 Units total) by mouth daily.   escitalopram  (LEXAPRO ) 20 MG tablet Take 20 mg by mouth daily.   fluticasone  (FLONASE ) 50 MCG/ACT nasal spray Place 2 sprays into both nostrils daily. sometimes uses in the evening as needed   glucose blood (ONETOUCH ULTRA TEST) test strip Use as instructed to  check blood glucose daily. Dx E11.9   ibuprofen  (ADVIL ) 800 MG tablet Take 1 tablet (800 mg total) by mouth every 8 (eight) hours as needed.   metFORMIN  (GLUCOPHAGE ) 500 MG tablet Take 1 tablet (500 mg total) by mouth 2 (two) times daily with a meal.   metoprolol  succinate (TOPROL -XL) 25 MG 24 hr tablet Take 1 tablet (25 mg total) by mouth daily.   omeprazole  (PRILOSEC) 40 MG capsule Take 1 capsule (40 mg total) by mouth daily.   prazosin  (MINIPRESS ) 1 MG capsule Take 2 mg by mouth at bedtime.   rosuvastatin  (CRESTOR ) 20 MG tablet Take 1 tablet (20 mg total) by mouth daily.   Tiotropium Bromide-Olodaterol (STIOLTO RESPIMAT ) 2.5-2.5 MCG/ACT AERS Inhale 2 puffs into the lungs daily.   trazodone  (DESYREL ) 300 MG tablet Take 300 mg by mouth at bedtime.   No facility-administered medications prior to visit.    Review of Systems All negative Except see HPI       Objective    BP 109/64 (BP Location: Left Arm, Patient Position: Sitting, Cuff Size: Large)   Pulse 78   Resp 16   Ht 5' 10 (1.778 m)   Wt 222 lb (100.7 kg)   SpO2 98%   BMI 31.85 kg/m     Physical Exam Vitals reviewed.  Constitutional:      General: He is not in acute distress.    Appearance: Normal appearance. He is not diaphoretic.  HENT:     Head: Normocephalic and atraumatic.   Eyes:     General: No scleral icterus.    Conjunctiva/sclera: Conjunctivae normal.    Cardiovascular:     Rate and Rhythm: Normal rate and regular rhythm.     Pulses: Normal pulses.     Heart sounds: Normal heart sounds. No murmur heard. Pulmonary:     Effort: Pulmonary effort is normal. No respiratory distress.     Breath sounds: Normal breath sounds. No wheezing or rhonchi.   Musculoskeletal:     Cervical back: Neck supple.     Right lower leg: No edema.     Left lower leg: No edema.  Lymphadenopathy:     Cervical: No cervical adenopathy.   Skin:    General: Skin is warm and dry.     Findings: No rash.   Neurological:      Mental Status: He is alert and oriented to person, place, and time. Mental status is at baseline.   Psychiatric:        Mood and Affect: Mood normal.        Behavior: Behavior normal.      No results found for any visits on 07/20/23.      Assessment & Plan  Chronic Obstructive Pulmonary Disease (COPD) Managed with inhalers and CPAP. Exertional dyspnea present. Awaiting cardiothoracic surgery evaluation for tracheomalacia at [facility]. - Continue inhalers and CPAP. - Await cardiothoracic surgery evaluation at [facility].  Sleep apnea On CPAP Managed by Dr. Jess  Tracheobronchomalacia Optimization treatment for COPD Managed by pulmonology Patient was referred to cardiovascular surgeon at [facility]/tracheobronchoplasty  Type 2 Diabetes Mellitus Chronic and unstable Diabetes poorly controlled with A1c of 8. Advised dietary changes and weight loss for better glycemic control. - Continue metformin  500 mg BID. - Recheck blood work for diabetes control. - Encourage dietary changes and weight loss. - Advise reducing sugary drink intake. -BMP Consider foot exam hepatitis C screening completed vaccination history at follow-up Will follow-up  Hyperlipidemia Elevated triglycerides. Recommended omega-3 supplements and dietary changes. Avoided additional medications due to current medication burden. - Start omega-3 supplements. - Incorporate dietary changes with more fish, nuts, and avocado. - Recheck lipid levels in three months. Will follow-up  Constipation and Hemorrhoids Advised high fiber diet and hydration to prevent recurrence and manage constipation. - Maintain high fiber diet.  Patient education provided understand prescribed to start all. - Ensure adequate hydration.  Pt was advised: -Eliminate medications that cause constipation. -Increase fluid intake. -Increase soluble fiber (25-30g/day) in diet. -Encourage regular defecation attempts after eating. -Regular exercise -Enemas  if other methods fail- Bulking agents (accompanied by adequate fluids)  Psyllium  (Konsyl, Metamucil, Perdiem Fiber): 1 tbsp (approx 3.5 grams fiber) in 8-oz liquid PO daily up to TID Polyethylene glycol (PEG) (MiraLAX ) 17 g/day PO dissolved in 4 to 8 oz of beverage (current evidence shows PEG to be superior to lactulose Pt instructions/high-fiber eating plan were provided  advised to add yogurt to his daily diet. 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 Discussed treatment options and referred to dermatology. - Follow up with dermatology for evaluation and management. - Consider surgical removal of condyloma.  General Health Maintenance Discussed diet and exercise for overall health improvement. - Encourage balanced meals with good breakfast and lunch, light dinner. - Avoid eating 4-5 hours before bedtime. - Incorporate regular exercise.  Follow-up Emphasized adherence to follow-up appointments for comprehensive management. - Schedule follow-up in three months. - Conduct blood work to monitor diabetes and lipid levels. - Follow up with cardiothoracic surgery and dermatology as scheduled.  Will discuss vaccinations at follow-up    Type 2 diabetes mellitus without complication, without long-term current use of insulin  (HCC) (Primary)-you are doing such a great job with the booklet you provide exam with exercise - Basic metabolic panel with GFR  Depression, recurrent (HCC) Bipolar affective disorder, currently depressed, moderate (HCC) History of alcoholism (HCC) Chronic and well manage with Vraylar , Lexapro  20 Managed by behavioral health/[facility]  Orders Placed This Encounter  Procedures   Basic metabolic panel with GFR    Return in about 3 months (around 10/20/2023) for chronic disease f/u, BP f/u.   The patient was advised to call back or seek an in-person evaluation if the symptoms worsen or if the condition fails to  improve as anticipated.  I discussed the assessment and treatment plan with the patient. The patient was provided an opportunity to ask questions and all were answered. The patient agreed with the plan and demonstrated an understanding of the instructions.  I, Moises Terpstra, PA-C have reviewed all documentation for this visit. The documentation on 07/20/2023  for the exam, diagnosis, procedures, and orders are all accurate and complete.  Lary Eckardt  [facility], MMS [facility] [phone] (phone) [phone] (fax)  [facility]

## 2023-07-20 ENCOUNTER — Encounter: Payer: Self-pay | Admitting: Physician Assistant

## 2023-07-20 ENCOUNTER — Ambulatory Visit: Admitting: Physician Assistant

## 2023-07-20 VITALS — BP 109/64 | HR 78 | Resp 16 | Ht 70.0 in | Wt 222.0 lb

## 2023-07-20 DIAGNOSIS — F3132 Bipolar disorder, current episode depressed, moderate: Secondary | ICD-10-CM

## 2023-07-20 DIAGNOSIS — F1021 Alcohol dependence, in remission: Secondary | ICD-10-CM | POA: Diagnosis not present

## 2023-07-20 DIAGNOSIS — K5909 Other constipation: Secondary | ICD-10-CM

## 2023-07-20 DIAGNOSIS — F339 Major depressive disorder, recurrent, unspecified: Secondary | ICD-10-CM

## 2023-07-20 DIAGNOSIS — G4733 Obstructive sleep apnea (adult) (pediatric): Secondary | ICD-10-CM

## 2023-07-20 DIAGNOSIS — K648 Other hemorrhoids: Secondary | ICD-10-CM | POA: Diagnosis not present

## 2023-07-20 DIAGNOSIS — E119 Type 2 diabetes mellitus without complications: Secondary | ICD-10-CM | POA: Diagnosis not present

## 2023-07-20 DIAGNOSIS — J449 Chronic obstructive pulmonary disease, unspecified: Secondary | ICD-10-CM

## 2023-07-20 DIAGNOSIS — Z7984 Long term (current) use of oral hypoglycemic drugs: Secondary | ICD-10-CM

## 2023-07-20 DIAGNOSIS — A630 Anogenital (venereal) warts: Secondary | ICD-10-CM

## 2023-07-21 ENCOUNTER — Ambulatory Visit: Payer: Self-pay | Admitting: Physician Assistant

## 2023-07-21 LAB — BASIC METABOLIC PANEL WITH GFR
BUN/Creatinine Ratio: 10 (ref 10–24)
BUN: 9 mg/dL (ref 8–27)
CO2: 24 mmol/L (ref 20–29)
Calcium: 9.9 mg/dL (ref 8.6–10.2)
Chloride: 102 mmol/L (ref 96–106)
Creatinine, Ser: 0.9 mg/dL (ref 0.76–1.27)
Glucose: 138 mg/dL — ABNORMAL HIGH (ref 70–99)
Potassium: 4.5 mmol/L (ref 3.5–5.2)
Sodium: 142 mmol/L (ref 134–144)
eGFR: 95 mL/min/{1.73_m2} (ref >59)

## 2023-07-26 DIAGNOSIS — F314 Bipolar disorder, current episode depressed, severe, without psychotic features: Secondary | ICD-10-CM | POA: Diagnosis not present

## 2023-07-28 ENCOUNTER — Emergency Department
Admission: EM | Admit: 2023-07-28 | Discharge: 2023-07-29 | Disposition: A | Discharge status: Home / Self Care | Attending: Emergency Medicine | Admitting: Emergency Medicine

## 2023-07-28 ENCOUNTER — Other Ambulatory Visit: Payer: Self-pay

## 2023-07-28 ENCOUNTER — Emergency Department

## 2023-07-28 DIAGNOSIS — J449 Chronic obstructive pulmonary disease, unspecified: Secondary | ICD-10-CM | POA: Insufficient documentation

## 2023-07-28 DIAGNOSIS — N202 Calculus of kidney with calculus of ureter: Secondary | ICD-10-CM | POA: Insufficient documentation

## 2023-07-28 DIAGNOSIS — R109 Unspecified abdominal pain: Secondary | ICD-10-CM | POA: Diagnosis present

## 2023-07-28 DIAGNOSIS — D72829 Elevated white blood cell count, unspecified: Secondary | ICD-10-CM | POA: Diagnosis not present

## 2023-07-28 DIAGNOSIS — F17200 Nicotine dependence, unspecified, uncomplicated: Secondary | ICD-10-CM | POA: Insufficient documentation

## 2023-07-28 DIAGNOSIS — E119 Type 2 diabetes mellitus without complications: Secondary | ICD-10-CM | POA: Insufficient documentation

## 2023-07-28 DIAGNOSIS — R1013 Epigastric pain: Secondary | ICD-10-CM | POA: Diagnosis not present

## 2023-07-28 DIAGNOSIS — N201 Calculus of ureter: Secondary | ICD-10-CM | POA: Diagnosis not present

## 2023-07-28 DIAGNOSIS — R059 Cough, unspecified: Secondary | ICD-10-CM | POA: Diagnosis not present

## 2023-07-28 DIAGNOSIS — J441 Chronic obstructive pulmonary disease with (acute) exacerbation: Secondary | ICD-10-CM | POA: Diagnosis not present

## 2023-07-28 DIAGNOSIS — N401 Enlarged prostate with lower urinary tract symptoms: Secondary | ICD-10-CM | POA: Diagnosis not present

## 2023-07-28 DIAGNOSIS — N132 Hydronephrosis with renal and ureteral calculous obstruction: Secondary | ICD-10-CM | POA: Diagnosis not present

## 2023-07-28 DIAGNOSIS — K449 Diaphragmatic hernia without obstruction or gangrene: Secondary | ICD-10-CM | POA: Diagnosis not present

## 2023-07-28 DIAGNOSIS — K4090 Unilateral inguinal hernia, without obstruction or gangrene, not specified as recurrent: Secondary | ICD-10-CM | POA: Diagnosis not present

## 2023-07-28 LAB — COMPREHENSIVE METABOLIC PANEL WITH GFR
ALT: 23 U/L (ref 0–44)
AST: 25 U/L (ref 15–41)
Albumin: 4.4 g/dL (ref 3.5–5.0)
Alkaline Phosphatase: 95 U/L (ref 38–126)
Anion gap: 13 (ref 5–15)
BUN: 13 mg/dL (ref 8–23)
CO2: 25 mmol/L (ref 22–32)
Calcium: 10.3 mg/dL (ref 8.9–10.3)
Chloride: 100 mmol/L (ref 98–111)
Creatinine, Ser: 1.36 mg/dL — ABNORMAL HIGH (ref 0.61–1.24)
GFR, Estimated: 58 mL/min — ABNORMAL LOW (ref >60)
Glucose, Bld: 186 mg/dL — ABNORMAL HIGH (ref 70–99)
Potassium: 4.9 mmol/L (ref 3.5–5.1)
Sodium: 138 mmol/L (ref 135–145)
Total Bilirubin: 0.8 mg/dL (ref 0.0–1.2)
Total Protein: 8.6 g/dL — ABNORMAL HIGH (ref 6.5–8.1)

## 2023-07-28 LAB — LIPASE, BLOOD: Lipase: 23 U/L (ref 11–51)

## 2023-07-28 LAB — CBC
HCT: 49.9 % (ref 39.0–52.0)
Hemoglobin: 15.8 g/dL (ref 13.0–17.0)
MCH: 25.4 pg — ABNORMAL LOW (ref 26.0–34.0)
MCHC: 31.7 g/dL (ref 30.0–36.0)
MCV: 80.4 fL (ref 80.0–100.0)
Platelets: 507 10*3/uL — ABNORMAL HIGH (ref 150–400)
RBC: 6.21 MIL/uL — ABNORMAL HIGH (ref 4.22–5.81)
RDW: 21.3 % — ABNORMAL HIGH (ref 11.5–15.5)
WBC: 14.1 10*3/uL — ABNORMAL HIGH (ref 4.0–10.5)
nRBC: 0 % (ref 0.0–0.2)

## 2023-07-28 LAB — CBG MONITORING, ED: Glucose-Capillary: 174 mg/dL — ABNORMAL HIGH (ref 70–99)

## 2023-07-28 LAB — TROPONIN I (HIGH SENSITIVITY)
Troponin I (High Sensitivity): 9 ng/L (ref <18)
Troponin I (High Sensitivity): 8 ng/L (ref <18)

## 2023-07-28 MED ORDER — ONDANSETRON 4 MG PO TBDP
4.0000 mg | ORAL_TABLET | Freq: Once | ORAL | Status: DC
  Filled 2023-07-28: qty 1

## 2023-07-28 MED ORDER — LACTATED RINGERS IV BOLUS
1000.0000 mL | Freq: Once | INTRAVENOUS | Status: AC
  Administered 2023-07-28: 1000 mL via INTRAVENOUS

## 2023-07-28 MED ORDER — SODIUM CHLORIDE 0.9 % IV SOLN
1.5000 mg/kg | Freq: Once | INTRAVENOUS | Status: AC
  Administered 2023-07-28: 150 mg via INTRAVENOUS
  Filled 2023-07-28: qty 7.5

## 2023-07-28 MED ORDER — METHYLPREDNISOLONE SODIUM SUCC 125 MG IJ SOLR
125.0000 mg | Freq: Once | INTRAMUSCULAR | Status: AC
  Administered 2023-07-28: 125 mg via INTRAVENOUS
  Filled 2023-07-28: qty 2

## 2023-07-28 MED ORDER — IOHEXOL 350 MG/ML SOLN
100.0000 mL | Freq: Once | INTRAVENOUS | Status: AC | PRN
  Administered 2023-07-28: 100 mL via INTRAVENOUS

## 2023-07-28 MED ORDER — TAMSULOSIN HCL 0.4 MG PO CAPS
0.4000 mg | ORAL_CAPSULE | Freq: Once | ORAL | Status: AC
  Administered 2023-07-28: 0.4 mg via ORAL
  Filled 2023-07-28: qty 1

## 2023-07-28 MED ORDER — MORPHINE SULFATE (PF) 4 MG/ML IV SOLN
4.0000 mg | Freq: Once | INTRAVENOUS | Status: AC
  Administered 2023-07-28: 4 mg via INTRAVENOUS
  Filled 2023-07-28: qty 1

## 2023-07-28 MED ORDER — ONDANSETRON HCL 4 MG/2ML IJ SOLN
4.0000 mg | Freq: Once | INTRAMUSCULAR | Status: AC
  Administered 2023-07-28: 4 mg via INTRAVENOUS
  Filled 2023-07-28: qty 2

## 2023-07-28 MED ORDER — IPRATROPIUM-ALBUTEROL 0.5-2.5 (3) MG/3ML IN SOLN
6.0000 mL | Freq: Once | RESPIRATORY_TRACT | Status: AC
  Administered 2023-07-28: 6 mL via RESPIRATORY_TRACT
  Filled 2023-07-28: qty 6

## 2023-07-28 NOTE — ED Provider Notes (Signed)
 [facility] Provider Note    Event Date/Time   First MD Initiated Contact with Patient 07/28/23 2009     (approximate)   History   Abdominal Pain   HPI  Victor Alvarez is a 63 y.o. male who presents to the ED for evaluation of Abdominal Pain   Reviewed PCP visit from 1 week ago.  Obese patient with history of COPD, OSA, DM, HLD  Patient presents with 24 hours of right sided flank and abdominal pain, nausea and vomiting.  Symptoms started 24 hours ago with nausea.  Earlier this morning developed pain and multiple episodes of nonbloody nonbilious emesis.  Diaphoresis today as well.  No chest pain.  Reports not drinking any ethanol for at least 3 weeks.  He has history of appendectomy without other intra-abdominal surgeries.  He is also a smoker with increased cough without increased sputum production over the past few days.   Physical Exam   Triage Vital Signs: ED Triage Vitals  Encounter Vitals Group     BP 07/28/23 1850 121/64     Girls Systolic BP Percentile --      Girls Diastolic BP Percentile --      Boys Systolic BP Percentile --      Boys Diastolic BP Percentile --      Pulse Rate 07/28/23 1850 81     Resp 07/28/23 1850 17     Temp 07/28/23 1850 97.6 F (36.4 C)     Temp Source 07/28/23 1850 Oral     SpO2 07/28/23 1850 98 %     Weight 07/28/23 1851 220 lb (99.8 kg)     Height 07/28/23 1851 5' 10 (1.778 m)     Head Circumference --      Peak Flow --      Pain Score 07/28/23 1851 8     Pain Loc --      Pain Education --      Exclude from Growth Chart --     Most recent vital signs: Vitals:   07/28/23 2040 07/28/23 2130  BP:  (!) 165/79  Pulse: 78 84  Resp: 16 14  Temp:    SpO2: 90% 94%    General: Awake, no distress.  CV:  Good peripheral perfusion.  Resp:  Minimal tachypnea but no distress.  Prolonged expiratory phase and poor airflow Abd:  No distention.  RUQ tenderness and right lateral lower rib tenderness without  overlying skin changes or signs of trauma.  No guarding or peritoneal features.  Benign lower abdomen MSK:  No deformity noted.  Neuro:  No focal deficits appreciated. Other:     ED Results / Procedures / Treatments   Labs (all labs ordered are listed, but only abnormal results are displayed) Labs Reviewed  CBC - Abnormal; Notable for the following components:      Result Value   WBC 14.1 (*)    RBC 6.21 (*)    MCH 25.4 (*)    RDW 21.3 (*)    Platelets 507 (*)    All other components within normal limits  COMPREHENSIVE METABOLIC PANEL WITH GFR - Abnormal; Notable for the following components:   Glucose, Bld 186 (*)    Creatinine, Ser 1.36 (*)    Total Protein 8.6 (*)    GFR, Estimated 58 (*)    All other components within normal limits  CBG MONITORING, ED - Abnormal; Notable for the following components:   Glucose-Capillary 174 (*)    All other  components within normal limits  LIPASE, BLOOD  URINALYSIS, ROUTINE W REFLEX MICROSCOPIC  TROPONIN I (HIGH SENSITIVITY)  TROPONIN I (HIGH SENSITIVITY)    EKG Sinus rhythm with a rate of 95 bpm.  Normal axis and intervals without clear signs of acute ischemia.  RADIOLOGY CXR interpreted by me without evidence of acute cardiopulmonary pathology. CT renal study interpreted by me with moderately sized obstructing and proximal right-sided ureteral stone  Official radiology report(s): CT ABDOMEN PELVIS W CONTRAST Result Date: 07/28/2023 CLINICAL DATA:  Right upper quadrant and epigastric pain. Abdominal pain 8/10 with nausea. EXAM: CT ABDOMEN AND PELVIS WITH CONTRAST TECHNIQUE: Multidetector CT imaging of the abdomen and pelvis was performed using the standard protocol following bolus administration of intravenous contrast. RADIATION DOSE REDUCTION: This exam was performed according to the departmental dose-optimization program which includes automated exposure control, adjustment of the mA and/or kV according to patient size and/or use of  iterative reconstruction technique. CONTRAST:  OMNIPAQUE  IOHEXOL  350 MG/ML SOLN COMPARISON:  CT with IV contrast 08/24/2019, CTA GI bleed study 02/16/2023. FINDINGS: Lower chest: There is linear scarring or atelectasis in the posterior lung bases, linear scarring in the right middle lobe. No focal infiltrate is seen. The cardiac size is normal. Small hiatal hernia. Hepatobiliary: The liver is 20 cm length, with mild-to-moderate diffuse steatosis. No mass enhancement is seen. The gallbladder and bile ducts are unremarkable. Pancreas: No abnormality. Spleen: No abnormality. Adrenals/Urinary Tract: No adrenal or renal mass enhancement is seen. There are a few bilateral subcentimeter Bosniak 2 renal cortical cysts which are too small to characterize. No follow-up imaging is recommended. On the right, there is asymmetric perinephric edema, delayed cortical excretion with cortical contrast retention, and moderate hydroureteronephrosis due to a rod-like 7 x 3 x 3 mm mid ureteral stone at the level of the iliac crests and L4-5 interspace. Both ureters are otherwise clear. There is a solitary 2 mm nonobstructing caliceal stone in the midpole left kidney. No other nephrolithiasis is seen. The bladder is unremarkable. Stomach/Bowel: No dilatation or wall thickening. An appendix is not seen in this patient. Vascular/Lymphatic: Aortic atherosclerosis. No enlarged abdominal or pelvic lymph nodes. Reproductive: Enlarged prostate 4.9 cm transverse. Other: There are small inguinal fat hernias, left slightly larger. There is no incarcerated hernia, no free fluid, free hemorrhage or free air. Musculoskeletal: Chronic disc collapse and spondylosis L2-3. Mild degenerative change of the spine elsewhere. No acute or other significant osseous findings. IMPRESSION: 1. 7 x 3 x 3 mm mid right ureteral stone with moderate obstructive uropathy. 2. 2 mm nonobstructing left renal stone. 3. Hepatomegaly with steatosis. 4. Aortic  atherosclerosis. 5. Prostatomegaly. 6. Small hiatal hernia. 7. Small inguinal fat hernias, left slightly larger. Aortic Atherosclerosis (ICD10-I70.0). Electronically Signed   By: Francis Quam M.D.   On: 07/28/2023 22:30   DG Chest Portable 1 View Result Date: 07/28/2023 CLINICAL DATA:  Cough EXAM: PORTABLE CHEST 1 VIEW COMPARISON:  None Available. FINDINGS: The heart size and mediastinal contours are within normal limits. Both lungs are clear. The visualized skeletal structures are unremarkable. IMPRESSION: No active disease. Electronically Signed   By: Greig Pique M.D.   On: 07/28/2023 21:02    PROCEDURES and INTERVENTIONS:  .1-3 Lead EKG Interpretation  Performed by: Claudene Rover, MD Authorized by: Claudene Rover, MD     Interpretation: normal     ECG rate:  80   ECG rate assessment: normal     Rhythm: sinus rhythm     Ectopy: none  Conduction: normal     Medications  lidocaine  (XYLOCAINE ) 150 mg in sodium chloride  0.9 % 100 mL IVPB (has no administration in time range)  morphine  (PF) 4 MG/ML injection 4 mg (4 mg Intravenous Given 07/28/23 2016)  ondansetron  (ZOFRAN ) injection 4 mg (4 mg Intravenous Given 07/28/23 2013)  lactated ringers  bolus 1,000 mL (0 mLs Intravenous Stopped 07/28/23 2246)  ipratropium-albuterol  (DUONEB) 0.5-2.5 (3) MG/3ML nebulizer solution 6 mL (6 mLs Nebulization Given 07/28/23 2044)  methylPREDNISolone sodium succinate (SOLU-MEDROL) 125 mg/2 mL injection 125 mg (125 mg Intravenous Given 07/28/23 2045)  iohexol  (OMNIPAQUE ) 350 MG/ML injection 100 mL (100 mLs Intravenous Contrast Given 07/28/23 2153)  tamsulosin (FLOMAX) capsule 0.4 mg (0.4 mg Oral Given 07/28/23 2317)     IMPRESSION / MDM / ASSESSMENT AND PLAN / ED COURSE  I reviewed the triage vital signs and the nursing notes.  Differential diagnosis includes, but is not limited to, ACS, PTX, PNA, muscle strain/spasm, PE, dissection, anxiety, pleural effusion, biliary colic, pancreatitis,  gastritis  {Patient presents with symptoms of an acute illness or injury that is potentially life-threatening.  Patient presents with right sided chest and abdominal pain, diaphoresis, nausea and emesis with signs of ureteral colic.  Obstructing moderately sized proximal stone on CT imaging.  Has some signs of a mild COPD exacerbation treated with steroids and breathing treatments.  2 negative troponins and I doubt ACS.  Clear CXR.  Normal lipase.  Leukocytosis is noted and pending UA.  Clinical Course as of 07/28/23 2320  Wed Jul 28, 2023  2030 Nausea last night, pain today [DS]  2319 Reassessed the patient and discussed CT results and plan of care.  He is agreeable  He has been signed out to oncoming physician to follow-up on patient's pain control after tamsulosin, IV lidocaine  and to evaluate patient's urinalysis which is pending. [DS]    Clinical Course User Index [DS] Claudene Rover, MD     FINAL CLINICAL IMPRESSION(S) / ED DIAGNOSES   Final diagnoses:  Ureteral stone     Rx / DC Orders   ED Discharge Orders     None        Note:  This document was prepared using Dragon voice recognition software and may include unintentional dictation errors.   Claudene Rover, MD 07/28/23 [phone]

## 2023-07-28 NOTE — ED Triage Notes (Signed)
 Pt sts that he has been sick on his stomach all day since last night. Pt is diaphoretic and pale at this time.

## 2023-07-28 NOTE — ED Provider Notes (Signed)
  Physical Exam  BP (!) 165/79   Pulse 84   Temp 98.6 F (37 C) (Oral)   Resp 14   Ht 5' 10 (1.778 m)   Wt 99.8 kg   SpO2 94%   BMI 31.57 kg/m   Physical Exam  Procedures  Procedures  ED Course / MDM   Clinical Course as of 07/29/23 0032  Wed Jul 28, 2023  2030 Nausea last night, pain today [DS]  2319 Reassessed the patient and discussed CT results and plan of care.  He is agreeable  He has been signed out to oncoming physician to follow-up on patient's pain control after tamsulosin, IV lidocaine  and to evaluate patient's urinalysis which is pending. [DS]  Thu Jul 29, 2023  0031 Reassessed patient.  He states he is now feeling much better at this time and would prefer to go home with outpatient management.  Will give him urology follow-up as well as medications into his pharmacy. [DW]    Clinical Course User Index [DS] Claudene Rover, MD [DW] Malvina Alm DASEN, MD   Medical Decision Making Amount and/or Complexity of Data Reviewed Labs: ordered. Radiology: ordered.  Risk Prescription drug management.   Received signout on patient.  63 year old male presenting today for right-sided abdominal pain associated with nausea and vomiting.  Seen by initial provider and ultimately found on CT to have moderate-sized kidney stone on the right side.  Given initial pain medicine without significant improvement in his symptoms.  Additional pain medicine was given and he was signed out pending reassessment.  Separately had concerns of possible COPD exacerbation which appears under control following DuoNeb and steroids.  Patient was reassessed following medication treatment and states he feels much better at this time.  I did offer admission given the amount of pain medicine he received here but he states he would prefer to go home at this time.  Will discharge with pain medicine as well as a couple of days of steroids given his COPD exacerbation.  Given urology follow-up and strict return  precautions.     Malvina Alm DASEN, MD 07/29/23 [phone]

## 2023-07-28 NOTE — ED Notes (Addendum)
 Pt diaphoretic and appears to be extremely uncomfortable. Rates mid/right sided abd pain 8/10. Nausea continues. Denies diarrhea. Last bowel movement yesterday and has not been able to have bowel movement today - states he had to strain for BM yesterday.

## 2023-07-28 NOTE — ED Notes (Addendum)
 Pt found lying on bench in lobby sweating profusely, appears uncomfortable; c/o nauses and rt sided abd/flank pain; pt assisted into w/c and taken to room 6 for further eval; reports called to care nurse JINNY Ford, RN

## 2023-07-29 DIAGNOSIS — N202 Calculus of kidney with calculus of ureter: Secondary | ICD-10-CM | POA: Diagnosis not present

## 2023-07-29 LAB — URINALYSIS, ROUTINE W REFLEX MICROSCOPIC
Bilirubin Urine: NEGATIVE
Glucose, UA: 150 mg/dL — AB
Ketones, ur: 20 mg/dL — AB
Leukocytes,Ua: NEGATIVE
Nitrite: NEGATIVE
Protein, ur: 30 mg/dL — AB
Specific Gravity, Urine: >1.046 — ABNORMAL HIGH (ref 1.005–1.030)
pH: 5 (ref 5.0–8.0)

## 2023-07-29 MED ORDER — OXYCODONE HCL 5 MG PO TABS: 5.0000 mg | ORAL_TABLET | Freq: Three times a day (TID) | ORAL | 0 refills | Status: DC | PRN

## 2023-07-29 MED ORDER — PREDNISONE 10 MG PO TABS: 40.0000 mg | ORAL_TABLET | Freq: Every day | ORAL | 0 refills | Status: AC

## 2023-07-29 MED ORDER — TAMSULOSIN HCL 0.4 MG PO CAPS: 0.4000 mg | ORAL_CAPSULE | Freq: Every day | ORAL | 0 refills | Status: DC

## 2023-07-29 MED ORDER — OXYCODONE-ACETAMINOPHEN 5-325 MG PO TABS
1.0000 | ORAL_TABLET | Freq: Once | ORAL | Status: AC
  Administered 2023-07-29: 1 via ORAL
  Filled 2023-07-29: qty 1

## 2023-07-29 NOTE — Discharge Instructions (Addendum)
 I would like you to take ibuprofen  600 mg every 8 hours for the next 3 to 5 days scheduled.  I have sent oxycodone  to your pharmacy which you can take as needed for more severe pain symptoms.  I have also sent prednisone to help with your COPD and breathing symptoms.  Lastly, a medication called Flomax is at your pharmacy to help expedite passage of the kidney stone.  I have given you follow-up with urology given the large size in case you are not able to pass it so they can potentially do a procedure on it.  Please return if you have any severe or worsening pain symptoms not tolerable with the medications at home.  Please do not drive or operate heavy machinery while on the oxycodone .

## 2023-08-11 ENCOUNTER — Ambulatory Visit
Admission: RE | Admit: 2023-08-11 | Discharge: 2023-08-11 | Disposition: A | Discharge status: Home / Self Care | Source: Ambulatory Visit | Attending: Urology | Admitting: Urology

## 2023-08-11 ENCOUNTER — Ambulatory Visit (INDEPENDENT_AMBULATORY_CARE_PROVIDER_SITE_OTHER): Admitting: Urology

## 2023-08-11 VITALS — BP 126/78 | HR 80 | Ht 70.0 in | Wt 213.0 lb

## 2023-08-11 DIAGNOSIS — N201 Calculus of ureter: Secondary | ICD-10-CM

## 2023-08-11 DIAGNOSIS — N1330 Unspecified hydronephrosis: Secondary | ICD-10-CM

## 2023-08-11 DIAGNOSIS — N200 Calculus of kidney: Secondary | ICD-10-CM

## 2023-08-11 DIAGNOSIS — N23 Unspecified renal colic: Secondary | ICD-10-CM | POA: Diagnosis not present

## 2023-08-11 DIAGNOSIS — N132 Hydronephrosis with renal and ureteral calculous obstruction: Secondary | ICD-10-CM

## 2023-08-11 LAB — URINALYSIS, COMPLETE
Bilirubin, UA: NEGATIVE
Ketones, UA: NEGATIVE
Leukocytes,UA: NEGATIVE
Nitrite, UA: NEGATIVE
RBC, UA: NEGATIVE
Specific Gravity, UA: 1.02 (ref 1.005–1.030)
Urobilinogen, Ur: 0.2 mg/dL (ref 0.2–1.0)
pH, UA: 6 (ref 5.0–7.5)

## 2023-08-11 LAB — MICROSCOPIC EXAMINATION: Epithelial Cells (non renal): >10 /HPF — AB (ref 0–10)

## 2023-08-11 NOTE — Progress Notes (Signed)
 I, Victor Alvarez, acting as a scribe for Victor JAYSON Barba, MD., have documented all relevant documentation on the behalf of Victor JAYSON Barba, MD, as directed by Victor JAYSON Barba, MD while in the presence of Victor JAYSON Barba, MD.  08/11/2023 3:23 PM   Victor Alvarez 23-May-1960 [id number]  Referring provider: Dineen Channel, PA-C [address] [place],  [place] [address]  Chief Complaint  Patient presents with   Nephrolithiasis    HPI: Victor Alvarez is a 63 y.o. male presents in follow-up a recent ED visit for renal colic.   Presented to ED 07/28/23 with a 24-hour history of right flank pain associated with nausea and vomiting. Pain radiated to the right lower quadrant. No identifiable precipitating, aggravating, or alleviating factors. Denied fever or chills. Urinalysis in the ED showed 21-50 RBCs and 11-20 WBCs. CT abdomen pelvis with contrast showed a 7 mm right lower proximal ureteral calculus with moderate hydronephrosis and upstream hydroureter. Non-obstructing 2 mm left renal calculus noted. No prior history of stone disease. States his pain is much better since the initial ED visit. Ran out of medication over a week ago but has not required narcotic pain medication.   PMH: Past Medical History:  Diagnosis Date   Acute appendicitis 08/24/2019   Allergy    Arthritis    Bipolar affective disorder (HCC)    Colon polyp    COPD (chronic obstructive pulmonary disease) (HCC)    Depression    GERD (gastroesophageal reflux disease)    Hemorrhagic shock (HCC) 02/16/2023   Hemorrhoids    Hypercholesterolemia    Mixed hyperlipidemia    Neuromuscular disorder (HCC)    PTSD (post-traumatic stress disorder)    Right wrist fracture 03/25/2015   Sleep apnea    Type 2 diabetes mellitus without complication, without long-term current use of insulin  (HCC)     Surgical History: Past Surgical History:  Procedure Laterality Date   CATARACT EXTRACTION W/ INTRAOCULAR LENS IMPLANT  Right    COLONOSCOPY WITH PROPOFOL  N/A 01/18/2023   Procedure: COLONOSCOPY WITH PROPOFOL ;  Surgeon: Therisa Bi, MD;  Location: [facility] ENDOSCOPY;  Service: Gastroenterology;  Laterality: N/A;   HEMORRHOID SURGERY N/A 02/10/2023   Procedure: HEMORRHOIDECTOMY;  Surgeon: Lane Shope, MD;  Location: [facility] ORS;  Service: General;  Laterality: N/A;  surgifoam placed   HEMOSTASIS CLIP PLACEMENT  01/18/2023   Procedure: HEMOSTASIS CLIP PLACEMENT;  Surgeon: Therisa Bi, MD;  Location: [facility] ENDOSCOPY;  Service: Gastroenterology;;   MINOR FULGERATION OF ANAL CONDYLOMA N/A 02/10/2023   Procedure: MINOR FULGERATION OF ANAL CONDYLOMA;  Surgeon: Lane Shope, MD;  Location: [facility] ORS;  Service: General;  Laterality: N/A;   POLYPECTOMY  01/18/2023   Procedure: POLYPECTOMY;  Surgeon: Therisa Bi, MD;  Location: [facility] ENDOSCOPY;  Service: Gastroenterology;;   RECTAL EXAM UNDER ANESTHESIA N/A 02/10/2023   Procedure: RECTAL EXAM UNDER ANESTHESIA;  Surgeon: Lane Shope, MD;  Location: [facility] ORS;  Service: General;  Laterality: N/A;   RECTAL EXAM UNDER ANESTHESIA N/A 02/16/2023   Procedure: RECTAL EXAM UNDER ANESTHESIA;  Surgeon: Jordis Laneta FALCON, MD;  Location: [facility] ORS;  Service: General;  Laterality: N/A;   WRIST FRACTURE SURGERY Right 04/11/2015   XI ROBOTIC LAPAROSCOPIC ASSISTED APPENDECTOMY N/A 08/24/2019   Procedure: XI ROBOTIC LAPAROSCOPIC ASSISTED APPENDECTOMY;  Surgeon: Lane Shope, MD;  Location: [facility] ORS;  Service: General;  Laterality: N/A;    Home Medications:  Allergies as of 08/11/2023       Reactions   Codeine Nausea And Vomiting, Other (See  Comments), Nausea Only   Metformin  Other (See Comments)   unsure        Medication List        Accurate as of August 11, 2023  3:23 PM. If you have any questions, ask your nurse or doctor.          Accu-Chek Softclix Lancets lancets Use as instructed to check blood glucose daily. Dx E11.9   albuterol  108 (90 Base) MCG/ACT inhaler Commonly  known as: VENTOLIN  HFA Inhale 2 puffs into the lungs every 6 (six) hours as needed for wheezing or shortness of breath.   aspirin  EC 81 MG tablet Take 81 mg by mouth daily.   B-complex with vitamin C tablet Take 1 tablet by mouth daily.   cariprazine  3 MG capsule Commonly known as: Vraylar  Take 1 capsule (3 mg total) by mouth daily.   cetirizine  10 MG chewable tablet Commonly known as: ZYRTEC  Chew 1 tablet (10 mg total) by mouth daily.   cetirizine  10 MG tablet Commonly known as: ZYRTEC  Take 1 tablet (10 mg total) by mouth daily.   escitalopram  20 MG tablet Commonly known as: LEXAPRO  Take 20 mg by mouth daily.   fluticasone  50 MCG/ACT nasal spray Commonly known as: FLONASE  Place 2 sprays into both nostrils daily. sometimes uses in the evening as needed   ibuprofen  800 MG tablet Commonly known as: ADVIL  Take 1 tablet (800 mg total) by mouth every 8 (eight) hours as needed.   metFORMIN  500 MG tablet Commonly known as: GLUCOPHAGE  Take 1 tablet (500 mg total) by mouth 2 (two) times daily with a meal.   metoprolol  succinate 25 MG 24 hr tablet Commonly known as: TOPROL -XL Take 1 tablet (25 mg total) by mouth daily.   omeprazole  40 MG capsule Commonly known as: PRILOSEC Take 1 capsule (40 mg total) by mouth daily.   ONE TOUCH ULTRA 2 w/Device Kit Use to check blood sugar daily. Dx E11.9   OneTouch Ultra Test test strip Generic drug: glucose blood Use as instructed to check blood glucose daily. Dx E11.9   oxyCODONE  5 MG immediate release tablet Commonly known as: Roxicodone  Take 1 tablet (5 mg total) by mouth every 8 (eight) hours as needed.   prazosin  1 MG capsule Commonly known as: MINIPRESS  Take 2 mg by mouth at bedtime.   rosuvastatin  20 MG tablet Commonly known as: Crestor  Take 1 tablet (20 mg total) by mouth daily.   Stiolto Respimat  2.5-2.5 MCG/ACT Aers Generic drug: Tiotropium Bromide-Olodaterol Inhale 2 puffs into the lungs daily.   tamsulosin  0.4  MG Caps capsule Commonly known as: FLOMAX  Take 1 capsule (0.4 mg total) by mouth daily.   trazodone  300 MG tablet Commonly known as: DESYREL  Take 300 mg by mouth at bedtime.   Vitamin D3 50 MCG (2000 UT) capsule Take 1 capsule (2,000 Units total) by mouth daily.        Allergies:  Allergies  Allergen Reactions   Codeine Nausea And Vomiting, Other (See Comments) and Nausea Only   Metformin  Other (See Comments)    unsure    Family History: Family History  Problem Relation Age of Onset   Heart disease Father 33   Heart attack Maternal Grandmother    Heart disease Paternal Grandfather     Social History:  reports that he has been smoking cigarettes. He started smoking about 51 years ago. He has a 51.5 pack-year smoking history. He has quit using smokeless tobacco.  His smokeless tobacco use included chew. He reports  that he does not currently use alcohol. He reports that he does not currently use drugs after having used the following drugs: Marijuana.   Physical Exam: BP 126/78   Pulse 80   Ht 5' 10 (1.778 m)   Wt 213 lb (96.6 kg)   BMI 30.56 kg/m   Constitutional:  Alert and oriented, No acute distress. HEENT: [place] AT, moist mucus membranes.  Trachea midline, no masses. Cardiovascular: No clubbing, cyanosis, or edema. Respiratory: Normal respiratory effort, no increased work of breathing. GI: Abdomen is soft, nontender, nondistended, no abdominal masses Skin: No rashes, bruises or suspicious lesions. Neurologic: Grossly intact, no focal deficits, moving all 4 extremities. Psychiatric: Normal mood and affect.   Urinalysis Dipstick 1+ glucose/trace protein, microscopy 6-10 WBC/0-2RBC/>10 Epis.   Pertinent Imaging: CT was personally reviewed and interpreted.   CT EXAM: CT ABDOMEN AND PELVIS WITH CONTRAST   TECHNIQUE: Multidetector CT imaging of the abdomen and pelvis was performed using the standard protocol following bolus administration of intravenous  contrast.   RADIATION DOSE REDUCTION: This exam was performed according to the departmental dose-optimization program which includes automated exposure control, adjustment of the mA and/or kV according to patient size and/or use of iterative reconstruction technique.   CONTRAST:  OMNIPAQUE  IOHEXOL  350 MG/ML SOLN   COMPARISON:  CT with IV contrast 08/24/2019, CTA GI bleed study 02/16/2023.   FINDINGS: Lower chest: There is linear scarring or atelectasis in the posterior lung bases, linear scarring in the right middle lobe.   No focal infiltrate is seen. The cardiac size is normal. Small hiatal hernia.   Hepatobiliary: The liver is 20 cm length, with mild-to-moderate diffuse steatosis. No mass enhancement is seen. The gallbladder and bile ducts are unremarkable.   Pancreas: No abnormality.   Spleen: No abnormality.   Adrenals/Urinary Tract: No adrenal or renal mass enhancement is seen.   There are a few bilateral subcentimeter Bosniak 2 renal cortical cysts which are too small to characterize.   No follow-up imaging is recommended. On the right, there is asymmetric perinephric edema, delayed cortical excretion with cortical contrast retention, and moderate hydroureteronephrosis due to a rod-like 7 x 3 x 3 mm mid ureteral stone at the level of the iliac crests and L4-5 interspace.   Both ureters are otherwise clear. There is a solitary 2 mm nonobstructing caliceal stone in the midpole left kidney. No other nephrolithiasis is seen. The bladder is unremarkable.   Stomach/Bowel: No dilatation or wall thickening. An appendix is not seen in this patient.   Vascular/Lymphatic: Aortic atherosclerosis. No enlarged abdominal or pelvic lymph nodes.   Reproductive: Enlarged prostate 4.9 cm transverse.   Other: There are small inguinal fat hernias, left slightly larger. There is no incarcerated hernia, no free fluid, free hemorrhage or free air.   Musculoskeletal:  Chronic disc collapse and spondylosis L2-3. Mild degenerative change of the spine elsewhere.   No acute or other significant osseous findings.   IMPRESSION: 1. 7 x 3 x 3 mm mid right ureteral stone with moderate obstructive uropathy. 2. 2 mm nonobstructing left renal stone. 3. Hepatomegaly with steatosis. 4. Aortic atherosclerosis. 5. Prostatomegaly. 6. Small hiatal hernia. 7. Small inguinal fat hernias, left slightly larger.   Aortic Atherosclerosis (ICD10-I70.0).     Electronically Signed   By: Francis Quam M.D.   On: 07/28/2023 22:30   Assessment & Plan:    1. Right proximal ureteral calculus  We discussed various treatment options for urolithiasis including observation with or without medical expulsive  therapy, shockwave lithotripsy (SWL), ureteroscopy and laser lithotripsy with stent placement, and percutaneous nephrolithotomy. We discussed that management is based on stone size, location, density, patient co-morbidities, and patient preference.  Stones <40mm in size have a >80% spontaneous passage rate. Data surrounding the use of tamsulosin  for medical expulsive therapy is controversial, but meta analyses suggests it is most efficacious for distal stones between 5-93mm in size. Possible side effects include dizziness/lightheadedness, and retrograde ejaculation. SWL has a lower stone free rate in a single procedure, but also a lower complication rate compared to ureteroscopy and avoids a stent and associated stent related symptoms. Possible complications include renal hematoma, steinstrasse, and need for additional treatment. Ureteroscopy with laser lithotripsy and stent placement has a higher stone free rate than SWL in a single procedure, however increased complication rate including possible infection, ureteral injury, bleeding, and stent related morbidity. Common stent related symptoms include dysuria, urgency/frequency, and flank pain. PCNL is the favored treatment for  stones >2cm. It involves a small incision in the flank, with complete fragmentation of stones and removal. It has the highest stone free rate, but also the highest complication rate. Possible complications include bleeding, infection/sepsis, injury to surrounding organs including the pleura, and collecting system injury.  KUB was ordered today, and if stone is visualized on plain film, he is interested in pursuing SWL.  2. Renal colic  Secondary to above  3 Right Hydronephrosis   [facility] [address] [place], [place] [address] [phone]

## 2023-08-12 ENCOUNTER — Encounter: Payer: Self-pay | Admitting: Urology

## 2023-08-16 ENCOUNTER — Ambulatory Visit: Payer: Self-pay | Admitting: Urology

## 2023-08-16 ENCOUNTER — Other Ambulatory Visit: Payer: Self-pay | Admitting: *Deleted

## 2023-08-16 DIAGNOSIS — N201 Calculus of ureter: Secondary | ICD-10-CM

## 2023-08-23 ENCOUNTER — Inpatient Hospital Stay: Admit: 2023-08-23

## 2023-08-23 ENCOUNTER — Ambulatory Visit
Admission: RE | Admit: 2023-08-23 | Discharge: 2023-08-23 | Disposition: A | Discharge status: Home / Self Care | Source: Ambulatory Visit | Attending: Urology | Admitting: Urology

## 2023-08-23 ENCOUNTER — Encounter: Payer: Self-pay | Admitting: Sleep Medicine

## 2023-08-23 ENCOUNTER — Ambulatory Visit: Payer: MEDICAID | Admitting: Sleep Medicine

## 2023-08-23 VITALS — BP 118/60 | HR 111 | Temp 98.2°F | Ht 70.0 in | Wt 217.4 lb

## 2023-08-23 DIAGNOSIS — F17210 Nicotine dependence, cigarettes, uncomplicated: Secondary | ICD-10-CM

## 2023-08-23 DIAGNOSIS — N201 Calculus of ureter: Secondary | ICD-10-CM

## 2023-08-23 DIAGNOSIS — E66811 Obesity, class 1: Secondary | ICD-10-CM

## 2023-08-23 DIAGNOSIS — G4733 Obstructive sleep apnea (adult) (pediatric): Secondary | ICD-10-CM

## 2023-08-23 DIAGNOSIS — Z6831 Body mass index (BMI) 31.0-31.9, adult: Secondary | ICD-10-CM

## 2023-08-23 IMAGING — DX DG FOOT 2V*L*
2 series · 2 of 2 positions shown · non-contrast
Comparison: None Available.

CLINICAL DATA: Left foot swelling

EXAM:
LEFT FOOT - 2 VIEW

[foot ap]
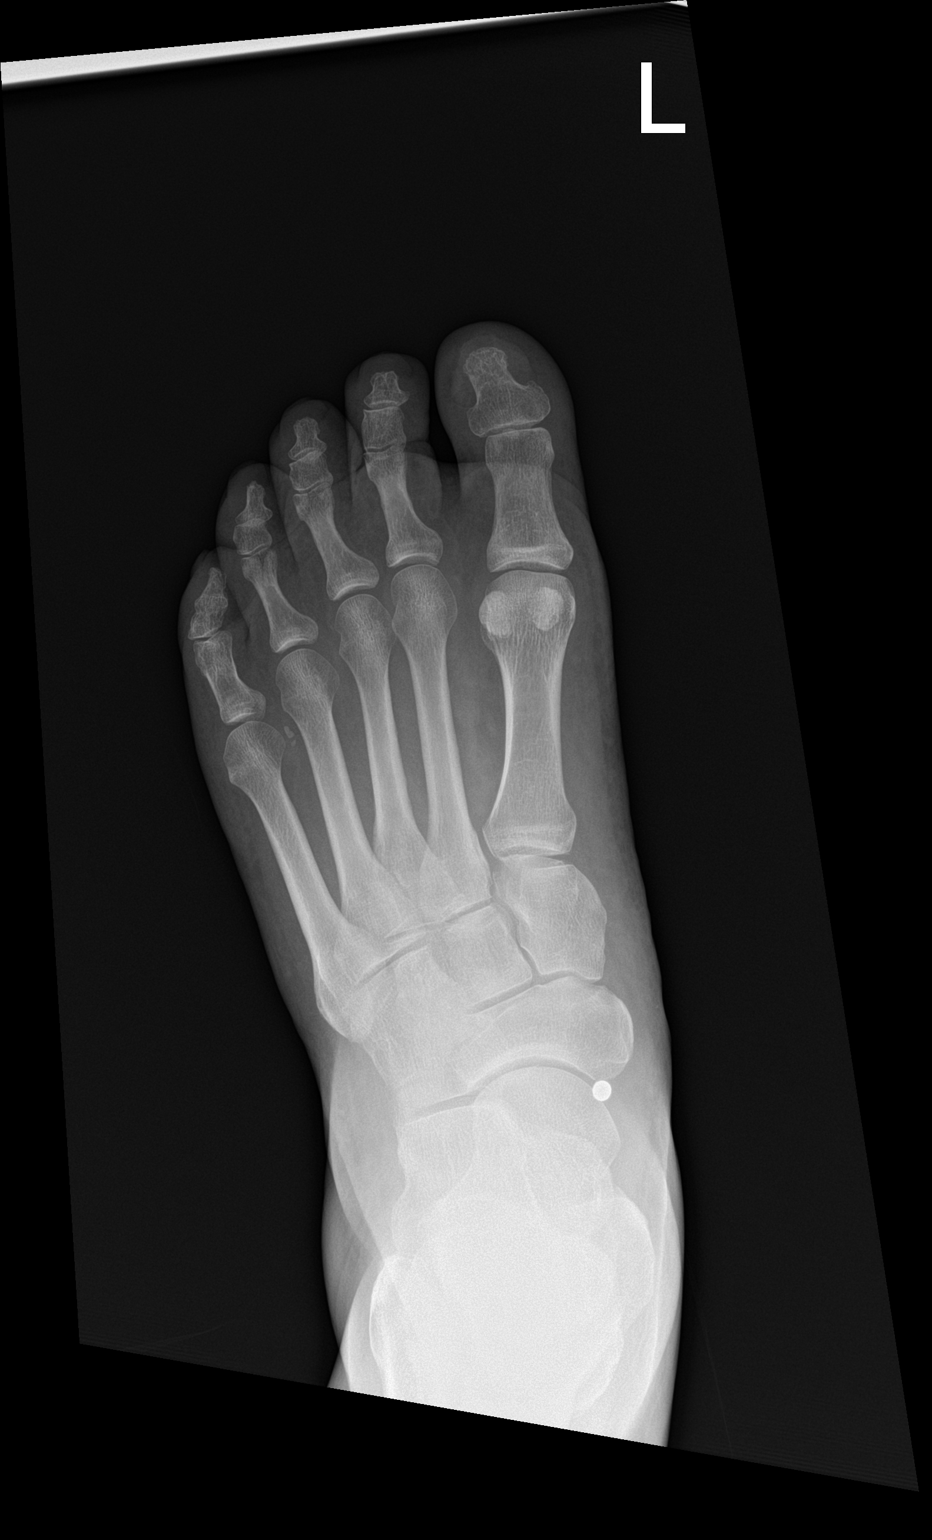

[foot lat]
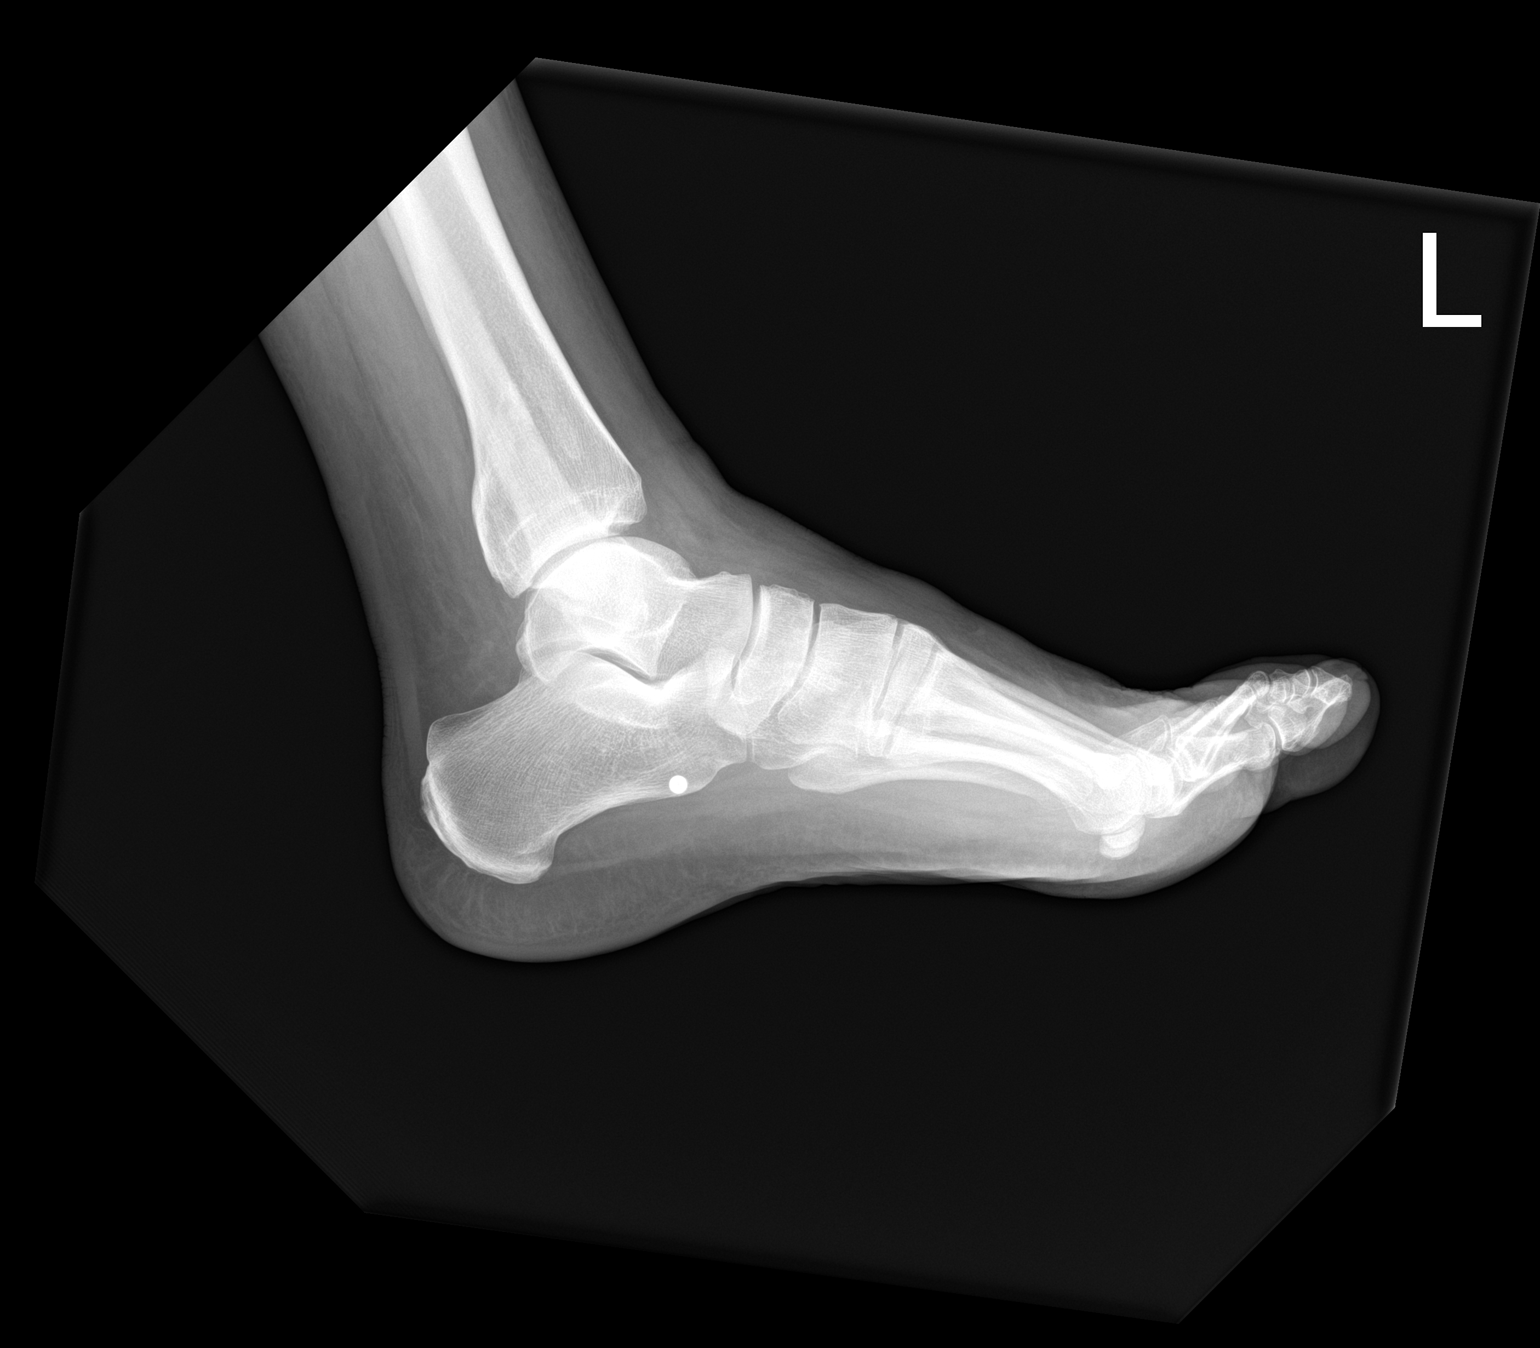

[2 of 2 positions shown; findings below may reference images not displayed]

FINDINGS: There is no evidence of fracture or dislocation. There is no
evidence of arthropathy or other focal bone abnormality. A retained
rounded metallic foreign body compatible with a BB seen within the
plantar soft tissues at the level of the anterior process of the
calcaneus. The soft tissues are otherwise unremarkable.
IMPRESSION: Retained metallic BB within the plantar soft tissues of the left
hindfoot.

## 2023-08-23 NOTE — Progress Notes (Signed)
 Name:Victor Alvarez MRN: [id number] DOB: 01/27/1961   CHIEF COMPLAINT:  CPAP F/U   HISTORY OF PRESENT ILLNESS:  Victor Alvarez is a 63 y.o. w/ a h/o OSA, anxiety, depression, hyperlipidemia, PTSD and obesity who presents for CPAP follow up visit. Reports using CPAP therapy almost every night, which is confirmed by compliance data. Reports feeling more refreshed upon awakening with CPAP therapy. Denies air leak or nasal congestion.    PAST MEDICAL HISTORY :   has a past medical history of Acute appendicitis (08/24/2019), Allergy, Arthritis, Bipolar affective disorder (HCC), Colon polyp, COPD (chronic obstructive pulmonary disease) (HCC), Depression, GERD (gastroesophageal reflux disease), Hemorrhagic shock (HCC) (02/16/2023), Hemorrhoids, Hypercholesterolemia, Mixed hyperlipidemia, Neuromuscular disorder (HCC), PTSD (post-traumatic stress disorder), Right wrist fracture (03/25/2015), Sleep apnea, and Type 2 diabetes mellitus without complication, without long-term current use of insulin  (HCC).  has a past surgical history that includes Xi robotic laparoscopic assisted appendectomy (N/A, 08/24/2019); Wrist fracture surgery (Right, 04/11/2015); Colonoscopy with propofol  (N/A, 01/18/2023); polypectomy (01/18/2023); Hemostasis clip placement (01/18/2023); Cataract extraction w/ intraocular lens implant (Right); Rectal exam under anesthesia (N/A, 02/10/2023); Hemorrhoid surgery (N/A, 02/10/2023); Minor fulgeration of anal condyloma (N/A, 02/10/2023); and Rectal exam under anesthesia (N/A, 02/16/2023). Prior to Admission medications   Medication Sig Start Date End Date Taking? Authorizing Provider  Accu-Chek Softclix Lancets lancets Use as instructed to check blood glucose daily. Dx E11.9 03/01/23  Yes Ostwalt, Janna, PA-C  albuterol  (VENTOLIN  HFA) 108 (90 Base) MCG/ACT inhaler Inhale 2 puffs into the lungs every 6 (six) hours as needed for wheezing or shortness of breath. 03/03/23  Yes Ostwalt, Janna, PA-C   aspirin  81 MG EC tablet Take 81 mg by mouth daily. 02/24/17  Yes [provider]  B Complex-C (B-COMPLEX WITH VITAMIN C) tablet Take 1 tablet by mouth daily.   Yes [provider]  Blood Glucose Monitoring Suppl (ONE TOUCH ULTRA 2) w/Device KIT Use to check blood sugar daily. Dx E11.9 03/01/23  Yes Ostwalt, Janna, PA-C  cetirizine  (ZYRTEC ) 10 MG chewable tablet Chew 1 tablet (10 mg total) by mouth daily. 03/25/23  Yes Ostwalt, Janna, PA-C  cetirizine  (ZYRTEC ) 10 MG tablet Take 1 tablet (10 mg total) by mouth daily. 04/15/23  Yes Ostwalt, Janna, PA-C  Cholecalciferol (VITAMIN D3) 50 MCG (2000 UT) capsule Take 1 capsule (2,000 Units total) by mouth daily. 04/08/23  Yes Ostwalt, Janna, PA-C  escitalopram  (LEXAPRO ) 20 MG tablet Take 20 mg by mouth daily.   Yes [provider]  fluticasone  (FLONASE ) 50 MCG/ACT nasal spray Place 2 sprays into both nostrils daily. sometimes uses in the evening as needed 03/03/23  Yes Ostwalt, Janna, PA-C  glucose blood (ONETOUCH ULTRA TEST) test strip Use as instructed to check blood glucose daily. Dx E11.9 03/01/23  Yes Ostwalt, Janna, PA-C  ibuprofen  (ADVIL ) 800 MG tablet Take 1 tablet (800 mg total) by mouth every 8 (eight) hours as needed. 02/10/23  Yes Lane Shope, MD  metFORMIN  (GLUCOPHAGE ) 500 MG tablet Take 1 tablet (500 mg total) by mouth 2 (two) times daily with a meal. 06/30/23  Yes Ostwalt, Janna, PA-C  metoprolol  succinate (TOPROL -XL) 25 MG 24 hr tablet Take 1 tablet (25 mg total) by mouth daily. 03/03/23  Yes Ostwalt, Janna, PA-C  omeprazole  (PRILOSEC) 40 MG capsule Take 1 capsule (40 mg total) by mouth daily. 03/03/23  Yes Ostwalt, Janna, PA-C  oxyCODONE  (ROXICODONE ) 5 MG immediate release tablet Take 1 tablet (5 mg total) by mouth every 8 (eight) hours as needed. 07/29/23  07/28/24 Yes Malvina Alm DASEN, MD  prazosin  (MINIPRESS ) 2 MG capsule Take 2 mg by mouth at bedtime. 07/16/23  Yes [provider]  rosuvastatin  (CRESTOR ) 20 MG  tablet Take 1 tablet (20 mg total) by mouth daily. 03/03/23  Yes Ostwalt, Janna, PA-C  tamsulosin  (FLOMAX ) 0.4 MG CAPS capsule Take 1 capsule (0.4 mg total) by mouth daily. 07/29/23  Yes Malvina Alm DASEN, MD  Tiotropium Bromide-Olodaterol (STIOLTO RESPIMAT ) 2.5-2.5 MCG/ACT AERS Inhale 2 puffs into the lungs daily. 03/03/23  Yes Ostwalt, Janna, PA-C  trazodone  (DESYREL ) 300 MG tablet Take 300 mg by mouth at bedtime.   Yes [provider]  VRAYLAR  4.5 MG CAPS Take 1 capsule by mouth daily. 07/01/23  Yes [provider]   Allergies  Allergen Reactions   Codeine Nausea And Vomiting, Other (See Comments) and Nausea Only   Metformin  Other (See Comments)    unsure    FAMILY HISTORY:  family history includes Heart attack in his maternal grandmother; Heart disease in his paternal grandfather; Heart disease (age of onset: 76) in his father. SOCIAL HISTORY:  reports that he has been smoking cigarettes. He started smoking about 51 years ago. He has a 51.6 pack-year smoking history. He has quit using smokeless tobacco.  His smokeless tobacco use included chew. He reports that he does not currently use alcohol. He reports that he does not currently use drugs after having used the following drugs: Marijuana.   Review of Systems:  Gen:  Denies  fever, sweats, chills weight loss  HEENT: Denies blurred vision, double vision, ear pain, eye pain, hearing loss, nose bleeds, sore throat Cardiac:  No dizziness, chest pain or heaviness, chest tightness,edema, No JVD Resp:   No cough, -sputum production, -shortness of breath,-wheezing, -hemoptysis,  Gi: Denies swallowing difficulty, stomach pain, nausea or vomiting, diarrhea, constipation, bowel incontinence Gu:  Denies bladder incontinence, burning urine Ext:   Denies Joint pain, stiffness or swelling Skin: Denies  skin rash, easy bruising or bleeding or hives Endoc:  Denies polyuria, polydipsia , polyphagia or weight change Psych:   Denies  depression, insomnia or hallucinations  Other:  All other systems negative  VITAL SIGNS: BP 118/60 (BP Location: Right Arm, Patient Position: Sitting, Cuff Size: Large)   Pulse (!) 111   Temp 98.2 F (36.8 C) (Oral)   Ht 5' 10 (1.778 m)   Wt 217 lb 6.4 oz (98.6 kg)   SpO2 96%   BMI 31.19 kg/m    Physical Examination:   General Appearance: No distress  EYES PERRLA, EOM intact.   NECK Supple, No JVD Pulmonary: normal breath sounds, No wheezing.  CardiovascularNormal S1,S2.  No m/r/g.   Abdomen: Benign, Soft, non-tender. Skin:   warm, no rashes, no ecchymosis  Extremities: normal, no cyanosis, clubbing. Neuro:without focal findings,  speech normal  PSYCHIATRIC: Mood, affect within normal limits.   ASSESSMENT AND PLAN  OSA Patient is using and benefiting from CPAP therapy. Counseled patient on increasing CPAP compliance. Discussed the consequences of untreated sleep apnea. Advised not to drive drowsy for safety of patient and others. Will follow up in 3 months.    Obesity Improved. Has lost 20 lbs since last visit.    Patient  satisfied with Plan of action and management. All questions answered  I spent a total of 33 minutes reviewing chart data, face-to-face evaluation with the patient, counseling and coordination of care as detailed above.    Bradely Rudin, M.D.  Sleep Medicine [facility]

## 2023-08-23 NOTE — Patient Instructions (Signed)

## 2023-08-25 DIAGNOSIS — G4733 Obstructive sleep apnea (adult) (pediatric): Secondary | ICD-10-CM | POA: Diagnosis not present

## 2023-08-27 ENCOUNTER — Ambulatory Visit: Payer: Self-pay | Admitting: Urology

## 2023-08-30 ENCOUNTER — Other Ambulatory Visit: Payer: Self-pay | Admitting: Physician Assistant

## 2023-09-10 ENCOUNTER — Ambulatory Visit: Payer: Self-pay

## 2023-09-10 DIAGNOSIS — R42 Dizziness and giddiness: Secondary | ICD-10-CM | POA: Diagnosis not present

## 2023-09-10 DIAGNOSIS — Z8639 Personal history of other endocrine, nutritional and metabolic disease: Secondary | ICD-10-CM | POA: Diagnosis not present

## 2023-09-10 DIAGNOSIS — H9312 Tinnitus, left ear: Secondary | ICD-10-CM | POA: Diagnosis not present

## 2023-09-10 NOTE — Telephone Encounter (Signed)
 FYI Only or Action Required?: FYI only for provider.  Patient was last seen in primary care on 10/30/2022 by Blair, Diane W, FNP.  Called Nurse Triage reporting Dizziness.  Symptoms began several months ago.  Interventions attempted: Rest, hydration, or home remedies.  Symptoms are: unchanged.  Triage Disposition: See Physician Within 24 Hours  Patient/caregiver understands and will follow disposition?: Yes  **Pt. Referred to UC today for evaluation; he agrees with plan of car, as the soonest avail. Appointment is 8/11**         Copied from CRM #[id number]. Topic: Clinical - Red Word Triage >> Sep 10, 2023  9:15 AM Ivette P wrote: [facility] that prompted transfer to Nurse Triage: when stood felt really dizzy, happens every time he stands up. sometimes has blurried vision.   feels palpatations sometiems when sitting around   122/74 - BP   has history of vertigo - no medicaiton    Pt is with [facility] Reason for Disposition  [1] NO dizziness now AND [2] age > 65  Answer Assessment - Initial Assessment Questions 1. DESCRIPTION: Describe your dizziness.     Upon standing feels dizzy   2. VERTIGO: Do you feel like either you or the room is spinning or tilting?      No   3. LIGHTHEADED: Do you feel lightheaded? (e.g., somewhat faint, woozy, weak upon standing)     Yes  4. SEVERITY: How bad is it?  Can you walk?     6/10   5. ONSET:  When did the dizziness begin?     A few months   6. AGGRAVATING FACTORS: Does anything make it worse? (e.g., standing, change in head position)     Standing   7. CAUSE: What do you think is causing the dizziness?     Hx. Of Vertigo  8. RECURRENT SYMPTOM: Have you had dizziness before? If Yes, ask: When was the last time? What happened that time?     Yes, ongoing  9. OTHER SYMPTOMS: Do you have any other symptoms? (e.g., earache, headache, numbness, tinnitus, vomiting, weakness)  Tinnitus  chronic    Patient offered soonest avail. Appointment for 8/11; he declined stating he will be out of town. Pt. Referred to UC today for evaluation; he agrees with plan of care.     Pt is with [facility]  Protocols used: Dizziness - Vertigo-A-AH

## 2023-09-13 DIAGNOSIS — G4733 Obstructive sleep apnea (adult) (pediatric): Secondary | ICD-10-CM | POA: Diagnosis not present

## 2023-09-20 ENCOUNTER — Other Ambulatory Visit: Payer: Self-pay | Admitting: *Deleted

## 2023-09-20 DIAGNOSIS — R1084 Generalized abdominal pain: Secondary | ICD-10-CM

## 2023-09-24 ENCOUNTER — Ambulatory Visit
Admission: RE | Admit: 2023-09-24 | Discharge: 2023-09-24 | Disposition: A | Discharge status: Home / Self Care | Source: Ambulatory Visit | Attending: Urology | Admitting: Urology

## 2023-09-24 DIAGNOSIS — R1084 Generalized abdominal pain: Secondary | ICD-10-CM | POA: Insufficient documentation

## 2023-09-24 DIAGNOSIS — R109 Unspecified abdominal pain: Secondary | ICD-10-CM | POA: Diagnosis not present

## 2023-09-24 DIAGNOSIS — N202 Calculus of kidney with calculus of ureter: Secondary | ICD-10-CM | POA: Diagnosis not present

## 2023-09-25 DIAGNOSIS — G4733 Obstructive sleep apnea (adult) (pediatric): Secondary | ICD-10-CM | POA: Diagnosis not present

## 2023-10-04 ENCOUNTER — Ambulatory Visit: Payer: Self-pay | Admitting: Urology

## 2023-10-08 ENCOUNTER — Ambulatory Visit: Admitting: Sleep Medicine

## 2023-10-21 ENCOUNTER — Ambulatory Visit: Admitting: Physician Assistant

## 2023-10-26 DIAGNOSIS — G4733 Obstructive sleep apnea (adult) (pediatric): Secondary | ICD-10-CM | POA: Diagnosis not present

## 2023-11-13 DIAGNOSIS — G4733 Obstructive sleep apnea (adult) (pediatric): Secondary | ICD-10-CM | POA: Diagnosis not present

## 2023-11-26 ENCOUNTER — Ambulatory Visit: Admitting: Physician Assistant

## 2023-11-29 ENCOUNTER — Other Ambulatory Visit: Payer: Self-pay | Admitting: Physician Assistant

## 2023-11-29 DIAGNOSIS — E782 Mixed hyperlipidemia: Secondary | ICD-10-CM

## 2023-12-06 ENCOUNTER — Emergency Department: Admission: EM | Admit: 2023-12-06 | Discharge: 2023-12-06 | Discharge status: Against Medical Advice

## 2023-12-06 ENCOUNTER — Encounter: Payer: Self-pay | Admitting: Urology

## 2023-12-06 DIAGNOSIS — R112 Nausea with vomiting, unspecified: Secondary | ICD-10-CM | POA: Insufficient documentation

## 2023-12-06 DIAGNOSIS — R630 Anorexia: Secondary | ICD-10-CM | POA: Insufficient documentation

## 2023-12-06 DIAGNOSIS — K5900 Constipation, unspecified: Secondary | ICD-10-CM | POA: Diagnosis not present

## 2023-12-06 DIAGNOSIS — Z5321 Procedure and treatment not carried out due to patient leaving prior to being seen by health care provider: Secondary | ICD-10-CM | POA: Insufficient documentation

## 2023-12-06 DIAGNOSIS — R1084 Generalized abdominal pain: Secondary | ICD-10-CM | POA: Diagnosis present

## 2023-12-06 LAB — CBC
HCT: 48.9 % (ref 39.0–52.0)
Hemoglobin: 16 g/dL (ref 13.0–17.0)
MCH: 27.4 pg (ref 26.0–34.0)
MCHC: 32.7 g/dL (ref 30.0–36.0)
MCV: 83.9 fL (ref 80.0–100.0)
Platelets: 461 K/uL — ABNORMAL HIGH (ref 150–400)
RBC: 5.83 MIL/uL — ABNORMAL HIGH (ref 4.22–5.81)
RDW: 14.7 % (ref 11.5–15.5)
WBC: 8.7 K/uL (ref 4.0–10.5)
nRBC: 0 % (ref 0.0–0.2)

## 2023-12-06 LAB — COMPREHENSIVE METABOLIC PANEL WITH GFR
ALT: 27 U/L (ref 0–44)
AST: 20 U/L (ref 15–41)
Albumin: 3.9 g/dL (ref 3.5–5.0)
Alkaline Phosphatase: 96 U/L (ref 38–126)
Anion gap: 11 (ref 5–15)
BUN: 11 mg/dL (ref 8–23)
CO2: 29 mmol/L (ref 22–32)
Calcium: 9.4 mg/dL (ref 8.9–10.3)
Chloride: 96 mmol/L — ABNORMAL LOW (ref 98–111)
Creatinine, Ser: 0.75 mg/dL (ref 0.61–1.24)
GFR, Estimated: >60 mL/min (ref >60)
Glucose, Bld: 197 mg/dL — ABNORMAL HIGH (ref 70–99)
Potassium: 4.9 mmol/L (ref 3.5–5.1)
Sodium: 136 mmol/L (ref 135–145)
Total Bilirubin: 0.5 mg/dL (ref 0.0–1.2)
Total Protein: 7.9 g/dL (ref 6.5–8.1)

## 2023-12-06 LAB — LIPASE, BLOOD: Lipase: 27 U/L (ref 11–51)

## 2023-12-06 NOTE — ED Triage Notes (Signed)
 Pt presents to the ED via POV from home with generalized abdominal pain, nausea, vomiting, constipation and decreased appetite x2 weeks. Pt A&Ox4.

## 2023-12-07 ENCOUNTER — Ambulatory Visit: Admitting: Physician Assistant

## 2023-12-07 ENCOUNTER — Ambulatory Visit
Admission: RE | Admit: 2023-12-07 | Discharge: 2023-12-07 | Disposition: A | Discharge status: Home / Self Care | Source: Ambulatory Visit | Attending: Physician Assistant | Admitting: Physician Assistant

## 2023-12-07 ENCOUNTER — Telehealth: Payer: Self-pay

## 2023-12-07 ENCOUNTER — Ambulatory Visit
Admission: RE | Admit: 2023-12-07 | Discharge: 2023-12-07 | Disposition: A | Discharge status: Home / Self Care | Attending: Physician Assistant | Admitting: Physician Assistant

## 2023-12-07 ENCOUNTER — Encounter: Payer: Self-pay | Admitting: Physician Assistant

## 2023-12-07 VITALS — BP 112/65 | HR 106 | Temp 97.5°F | Resp 16 | Ht 70.0 in | Wt 205.2 lb

## 2023-12-07 DIAGNOSIS — Z91199 Patient's noncompliance with other medical treatment and regimen due to unspecified reason: Secondary | ICD-10-CM

## 2023-12-07 DIAGNOSIS — K5909 Other constipation: Secondary | ICD-10-CM | POA: Diagnosis present

## 2023-12-07 NOTE — Progress Notes (Signed)
 Established patient visit  Patient: Victor Alvarez   DOB: [birth year]   63 y.o. Male  MRN: [id number] Visit Date: 12/07/2023  Today's healthcare provider: Jolynn Spencer, PA-C   Chief Complaint  Patient presents with   Constipation    Along with vomiting onset 3 weeks otc: suppository. Complains of rectal pain, nausea x 1 week    Subjective     HPI     Constipation    Additional comments: Along with vomiting onset 3 weeks otc: suppository. Complains of rectal pain, nausea x 1 week       Last edited by Wilfred Hargis RAMAN, CMA on 12/07/2023  8:06 AM.       Discussed the use of AI scribe software for clinical note transcription with the patient, who gave verbal consent to proceed.  History of Present Illness Victor Alvarez is a 63 year old male who presents with severe constipation and abdominal pain.  He has experienced severe constipation for two weeks, with no bowel movements and significant abdominal pain. He left the emergency room without treatment due to a long wait. He stopped all medications, including metformin , about a month ago, tapering them off gradually. He uses an albuterol  inhaler as needed. He tried a suppository without relief. He sips water  frequently but has a decreased appetite, eating only a piece of chicken last night. He notes weight loss during this period. He has a family history of pancreatic and stomach cancer, with his mother and grandfather having died from these conditions. He denies urinary issues but confirms generalized abdominal pain, nausea, vomiting, constipation, and decreased appetite for the past two weeks.       12/07/2023    8:08 AM 06/10/2023    8:25 AM 05/05/2023   11:37 PM  Depression screen PHQ 2/9  Decreased Interest 3 2 1   Down, Depressed, Hopeless 1 1 1   PHQ - 2 Score 4 3 2   Altered sleeping 2 2   Tired, decreased energy 2 3   Change in appetite 3 1   Feeling bad or failure about yourself  2 1   Trouble concentrating 1 1   Moving slowly or  fidgety/restless 1 0   Suicidal thoughts 1 0   PHQ-9 Score 16 11   Difficult doing work/chores Somewhat difficult Somewhat difficult       06/10/2023    8:25 AM 04/29/2023    9:38 AM 03/11/2023    8:56 AM 02/19/2023    1:10 PM  GAD 7 : Generalized Anxiety Score  Nervous, Anxious, on Edge 1 1 2 3   Control/stop worrying 0 1 2 2   Worry too much - different things 1 1 2 3   Trouble relaxing 1 1 2 3   Restless 1 2 1 2   Easily annoyed or irritable 1 1 1 2   Afraid - awful might happen 1 1 3 3   Total GAD 7 Score 6 8 13 18   Anxiety Difficulty Somewhat difficult Somewhat difficult Somewhat difficult Somewhat difficult    Medications: Outpatient Medications Prior to Visit  Medication Sig   Accu-Chek Softclix Lancets lancets Use as instructed to check blood glucose daily. Dx E11.9   albuterol  (VENTOLIN  HFA) 108 (90 Base) MCG/ACT inhaler Inhale 2 puffs into the lungs every 6 (six) hours as needed for wheezing or shortness of breath.   aspirin  81 MG EC tablet Take 81 mg by mouth daily.   B Complex-C (B-COMPLEX WITH VITAMIN C) tablet Take 1 tablet by mouth daily.   Blood Glucose Monitoring Suppl (  ONE TOUCH ULTRA 2) w/Device KIT Use to check blood sugar daily. Dx E11.9   cetirizine  (ZYRTEC ) 10 MG chewable tablet Chew 1 tablet (10 mg total) by mouth daily.   cetirizine  (ZYRTEC ) 10 MG tablet Take 1 tablet (10 mg total) by mouth daily.   Cholecalciferol (VITAMIN D3) 50 MCG (2000 UT) capsule Take 1 capsule (2,000 Units total) by mouth daily.   escitalopram  (LEXAPRO ) 20 MG tablet Take 20 mg by mouth daily.   fluticasone  (FLONASE ) 50 MCG/ACT nasal spray Place 2 sprays into both nostrils daily. sometimes uses in the evening as needed   glucose blood (ONETOUCH ULTRA TEST) test strip Use as instructed to check blood glucose daily. Dx E11.9   ibuprofen  (ADVIL ) 800 MG tablet Take 1 tablet (800 mg total) by mouth every 8 (eight) hours as needed.   metFORMIN  (GLUCOPHAGE ) 500 MG tablet TAKE 1 TABLET (500 MG TOTAL)  BY MOUTH 2 (TWO) TIMES DAILY WITH A MEAL.   metoprolol  succinate (TOPROL -XL) 25 MG 24 hr tablet Take 1 tablet (25 mg total) by mouth daily.   omeprazole  (PRILOSEC) 40 MG capsule Take 1 capsule (40 mg total) by mouth daily.   oxyCODONE  (ROXICODONE ) 5 MG immediate release tablet Take 1 tablet (5 mg total) by mouth every 8 (eight) hours as needed.   prazosin  (MINIPRESS ) 2 MG capsule Take 2 mg by mouth at bedtime.   rosuvastatin  (CRESTOR ) 20 MG tablet TAKE 1 TABLET (20 MG TOTAL) BY MOUTH DAILY.   tamsulosin  (FLOMAX ) 0.4 MG CAPS capsule Take 1 capsule (0.4 mg total) by mouth daily.   Tiotropium Bromide-Olodaterol (STIOLTO RESPIMAT ) 2.5-2.5 MCG/ACT AERS Inhale 2 puffs into the lungs daily.   trazodone  (DESYREL ) 300 MG tablet Take 300 mg by mouth at bedtime.   VRAYLAR  4.5 MG CAPS Take 1 capsule by mouth daily.   No facility-administered medications prior to visit.    Review of Systems All negative Except see HPI       Objective    BP (!) 147/89   Pulse (!) 105   Temp (!) 97.5 F (36.4 C) (Oral)   Resp 16   Ht 5' 10 (1.778 m)   Wt 205 lb 3.2 oz (93.1 kg)   SpO2 98%   BMI 29.44 kg/m     Physical Exam Vitals reviewed.  Constitutional:      General: He is not in acute distress.    Appearance: Normal appearance. He is not diaphoretic.  HENT:     Head: Normocephalic and atraumatic.  Eyes:     General: No scleral icterus.    Conjunctiva/sclera: Conjunctivae normal.  Cardiovascular:     Rate and Rhythm: Normal rate and regular rhythm.     Pulses: Normal pulses.     Heart sounds: Normal heart sounds. No murmur heard. Pulmonary:     Effort: Pulmonary effort is normal. No respiratory distress.     Breath sounds: Normal breath sounds. No wheezing or rhonchi.  Musculoskeletal:     Cervical back: Neck supple.     Right lower leg: No edema.     Left lower leg: No edema.  Lymphadenopathy:     Cervical: No cervical adenopathy.  Skin:    General: Skin is warm and dry.      Findings: No rash.  Neurological:     Mental Status: He is alert and oriented to person, place, and time. Mental status is at baseline.  Psychiatric:        Mood and Affect: Mood normal.  Behavior: Behavior normal.      No results found for any visits on 12/07/23.      Assessment and Plan Assessment & Plan Constipation Chronic constipation with severe symptoms. Possible bowel obstruction. Previous labs normal for pancreatic and liver function. - Start Miralax . - Order abdominal x-ray to assess for bowel obstruction. - If x-ray confirms obstruction, proceed to emergency room.  Patient's noncompliance with medical treatment and regimen Noncompliance with medication regimen for 1.5 months, leading to symptom exacerbation. Willing to restart medications. - Coordinate with clinic pharmacist for medication tapering and restarting. - Ensure gradual reintroduction of medications per pharmacist's guidance. Medical non-compliance (Primary)  - AMB Referral VBCI Care Management  Other constipation  - DG Abd 2 Views  Constipation, unspecified constipation type New problem X 2 weeks? All labs were reviewed from ED visit yesterday. CBC, cmp and lipase - stable for pt or unremarkable except low chloride of 96 and elevated glucose of 197 Pt was advised: -Eliminate medications that cause constipation. -Increase fluid intake. -Increase soluble fiber (25-30g/day) in diet. -Encourage regular defecation attempts after eating. -Regular exercise -Enemas if other methods fail- Bulking agents (accompanied by adequate fluids)  Psyllium  (Konsyl, Metamucil, Perdiem Fiber): 1 tbsp (approx 3.5 grams fiber) in 8-oz liquid PO daily up to TID Polyethylene glycol (PEG) (MiraLAX ) 17 g/day PO dissolved in 4 to 8 oz of beverage (current evidence shows PEG to be superior to lactulose Pt instructions/high-fiber eating plan were provided  advised to add prunes, yogurt to his daily diet. Will RTC if  symptoms persist  Medical non-compliance (Primary)  - AMB Referral VBCI Care Management  Other constipation  - DG Abd 2 Views to rule out secondary causes of constipation  No orders of the defined types were placed in this encounter.   No follow-ups on file.   The patient was advised to call back or seek an in-person evaluation if the symptoms worsen or if the condition fails to improve as anticipated.  I discussed the assessment and treatment plan with the patient. The patient was provided an opportunity to ask questions and all were answered. The patient agreed with the plan and demonstrated an understanding of the instructions.  I, Dartagnan Beavers, PA-C have reviewed all documentation for this visit. The documentation on 12/07/2023  for the exam, diagnosis, procedures, and orders are all accurate and complete.  Jolynn Spencer, [facility], MMS [facility] [phone] (phone) [phone] (fax)  [facility]

## 2023-12-07 NOTE — Telephone Encounter (Signed)
 Attempted to contact patient to schedule with clinical pharmacist. Left voicemail for patient to return call at earliest convenience.   Barbar Brede E. Marsh, PharmD, CPP Clinical Pharmacist [facility] [phone]

## 2023-12-08 ENCOUNTER — Ambulatory Visit (INDEPENDENT_AMBULATORY_CARE_PROVIDER_SITE_OTHER)

## 2023-12-08 VITALS — BP 101/68 | HR 90

## 2023-12-08 DIAGNOSIS — Z91199 Patient's noncompliance with other medical treatment and regimen due to unspecified reason: Secondary | ICD-10-CM

## 2023-12-08 DIAGNOSIS — Z7984 Long term (current) use of oral hypoglycemic drugs: Secondary | ICD-10-CM | POA: Diagnosis not present

## 2023-12-08 DIAGNOSIS — E559 Vitamin D deficiency, unspecified: Secondary | ICD-10-CM | POA: Diagnosis not present

## 2023-12-08 DIAGNOSIS — E119 Type 2 diabetes mellitus without complications: Secondary | ICD-10-CM

## 2023-12-08 LAB — POCT GLYCOSYLATED HEMOGLOBIN (HGB A1C): Hemoglobin A1C: 7.3 % — AB (ref 4.0–5.6)

## 2023-12-08 MED ORDER — VITAMIN D3 50 MCG (2000 UT) PO CAPS: 2000.0000 [IU] | ORAL_CAPSULE | Freq: Every day | ORAL | 3 refills | Status: AC

## 2023-12-08 NOTE — Patient Instructions (Addendum)
 Thanks for visiting with me today!  Restart metformin  500 mg twice daily. You can take one tablet daily for one week and then increase to one tablet twice daily Restart rosuvastatin  20 mg daily Restart vitamin D3 one capsule daily Restart Stiolto 2 puffs into the lungs once daily Restart metoprolol  succinate 25 mg once daily. Please refill this at the pharmacy

## 2023-12-08 NOTE — Progress Notes (Signed)
 S:     Reason for visit: ?  Victor Alvarez is a 63 y.o. male with a history of diabetes (type 2), who presents today for an initial diabetes Face to Face pharmacotherapy visit.? Pertinent PMH also includes COPD, OSA, GERD, bipolar disorder, tremors.  Care Team: Primary Care Provider: Ostwalt, Janna, PA-C  At last visit with PCP on 12/07/23, patient reported concerns of severe constipation x2 weeks, abdominal pain, and vomiting. Patient presented to the ED prior to this but left d/t long wait. He reported self-discontinuation of all medications about one month prior.   Today, patient reports he is scheduled to be seen at the psychiatric clinic later today to discuss re-initiation of all psychiatric medications.   Not taking any medications; previously medications include: Current diabetes medications include: metformin  500 mg BID Current hypertension medications include: metoprolol  succinate 25 mg daily  Current hyperlipidemia medications include: rosuvastatin  20 mg daily   Insurance coverage: [facility] Medicare/secondary Medicaid  Patient denies hypoglycemic events.  Reported home fasting blood sugars: not checking  GERD:  -Denies any recent heartburn symptoms. Reports he has not used omeprazole  in at least a month.  DM Prevention:  Statin: Not taking.?  ACE/ARB: no; History of chronic kidney disease? no Last urinary albumin/creatinine ratio:  Lab Results  Component Value Date   MICRALBCREAT 11 01/08/2023   Last eye exam:  Lab Results  Component Value Date   HMDIABEYEEXA No Retinopathy 06/16/2023   Lab Results  Component Value Date   HMDIABEYEEXA No Retinopathy 06/16/2023   Last foot exam: No foot exam found Tobacco Use:  Tobacco Use: High Risk (12/07/2023)   Patient History    Smoking Tobacco Use: Every Day    Smokeless Tobacco Use: Former    Passive Exposure: Not on file   O:   Vitals:  Wt Readings from Last 3 Encounters:  12/07/23 205 lb 3.2 oz (93.1 kg)  08/23/23  217 lb 6.4 oz (98.6 kg)  08/11/23 213 lb (96.6 kg)   BP Readings from Last 3 Encounters:  12/07/23 112/65  08/23/23 118/60  08/11/23 126/78   Pulse Readings from Last 3 Encounters:  12/07/23 (!) 106  08/23/23 (!) 111  08/11/23 80     Labs:?  Lab Results  Component Value Date   HGBA1C 8.0 (H) 06/10/2023   HGBA1C 6.6 (H) 12/10/2022   GLUCOSE 197 (H) 12/06/2023   MICRALBCREAT 11 01/08/2023   CREATININE 0.75 12/06/2023   CREATININE 1.36 (H) 07/28/2023   CREATININE 0.90 07/20/2023    Lab Results  Component Value Date   CHOL 132 06/10/2023   LDLCALC 60 06/10/2023   LDLCALC 114 (H) 12/10/2022   HDL 34 (L) 06/10/2023   TRIG 232 (H) 06/10/2023   TRIG 124 12/10/2022   ALT 27 12/06/2023   ALT 23 07/28/2023   AST 20 12/06/2023   AST 25 07/28/2023      Chemistry      Component Value Date/Time   NA 136 12/06/2023 1458   NA 142 07/20/2023 0000   K 4.9 12/06/2023 1458   CL 96 (L) 12/06/2023 1458   CO2 29 12/06/2023 1458   BUN 11 12/06/2023 1458   BUN 9 07/20/2023 0000   CREATININE 0.75 12/06/2023 1458      Component Value Date/Time   CALCIUM  9.4 12/06/2023 1458   ALKPHOS 96 12/06/2023 1458   AST 20 12/06/2023 1458   ALT 27 12/06/2023 1458   BILITOT 0.5 12/06/2023 1458   BILITOT <0.2 06/10/2023 0908  The 10-year ASCVD risk score (Arnett DK, et al., 2019) is: 23.9%  Lab Results  Component Value Date   MICRALBCREAT 11 01/08/2023    A/P: Diabetes currently uncontrolled with a most recent A1c of 7.3% on 12/08/23, likely d/t medication nonadherence. Will restart therapy at this time.  -Restarted metformin  500 mg BID.  -Patient educated on purpose, proper use, and potential adverse effects of metformin .  -Extensively discussed pathophysiology of diabetes, recommended lifestyle interventions, dietary effects on blood sugar control.  -Counseled on s/sx of and management of hypoglycemia.  -Next A1c anticipated 03/2024.   ASCVD risk - primary prevention in  patient with diabetes. Last LDL is 60 mg/dL, at goal of <29 mg/dL.  high intensity statin indicated.The 10-year ASCVD risk score (Arnett DK, et al., 2019) is: 20.3%   Values used to calculate the score:     Age: 62 years     Clincally relevant sex: Male     Is Non-Hispanic African American: No     Diabetic: Yes     Tobacco smoker: Yes     Systolic Blood Pressure: 101 mmHg     Is BP treated: No     HDL Cholesterol: 34 mg/dL     Total Cholesterol: 132 mg/dL -Restarted rosuvastatin  20 mg daily.   Hypertension longstanding currently controlled. Blood pressure goal of <130/80 mmHg. Patient is currently managed on metoprolol  for tremors. Will restart at this time.  -Restarted metoprolol  succinate 25 mg daily.  COPD: - Not currently taking inhaler medications. Will restart therapy at this time.  - Restarted Stiolto Respimat  2.5-2.55mcg 2 puffs daily - Restarted albuterol  2 puffs every 6 hours as needed    GERD, currently controlled off PPI therapy.  -PPIs are listed on the Beer's Criteria due to a risk of C. difficile infection, pneumonia, GI malignancies, bone loss, and fractures. Recommended to avoid long-term use (>8 weeks) unless in high risk patients. -Discontinued omeprazole     Written patient instructions provided. Patient verbalized understanding of treatment plan.  Total time in face to face counseling 40 minutes.     Follow-up:  Pharmacist on 01/12/24  Peyton CHARLENA Ferries, PharmD, CPP Clinical Pharmacist [facility] [phone]

## 2023-12-09 ENCOUNTER — Ambulatory Visit: Payer: Self-pay | Admitting: Physician Assistant

## 2024-01-12 ENCOUNTER — Other Ambulatory Visit: Payer: Self-pay | Admitting: Physician Assistant

## 2024-01-12 ENCOUNTER — Ambulatory Visit (INDEPENDENT_AMBULATORY_CARE_PROVIDER_SITE_OTHER)

## 2024-01-12 VITALS — BP 117/74 | HR 84

## 2024-01-12 DIAGNOSIS — K219 Gastro-esophageal reflux disease without esophagitis: Secondary | ICD-10-CM

## 2024-01-12 DIAGNOSIS — E119 Type 2 diabetes mellitus without complications: Secondary | ICD-10-CM

## 2024-01-12 DIAGNOSIS — Z7984 Long term (current) use of oral hypoglycemic drugs: Secondary | ICD-10-CM

## 2024-01-12 MED ORDER — OZEMPIC (0.25 OR 0.5 MG/DOSE) 2 MG/3ML ~~LOC~~ SOPN: 0.2500 mg | PEN_INJECTOR | SUBCUTANEOUS | 1 refills | Status: AC

## 2024-01-12 NOTE — Progress Notes (Signed)
 S:     Reason for visit: ?  Victor Alvarez is a 63 y.o. male with a history of diabetes (type 2), who presents today for a follow up diabetes Face to Face pharmacotherapy visit.? Pertinent PMH also includes COPD, OSA, GERD, bipolar disorder, tremors.  Care Team: Primary Care Provider: Ostwalt, Janna, PA-C  At last visit with PCP on 12/07/23, patient reported concerns of severe constipation x2 weeks, abdominal pain, and vomiting. Patient presented to the ED prior to this but left d/t long wait. He reported self-discontinuation of all medications about one month prior.   At last visit with clinical pharmacist, patient was restarted on metformin  500 mg BID, metoprolol  succinate 25 mg daily, Stiolto 2.5-2.5 mcg/act 2 puffs daily and rosuvastatin  20 mg daily.   In the interim, patient did not see his psychiatrist as previously planned. He reports re-starting all of his psychiatric medications.   Today, he reports his fasting BG levels have been elevated with an average of 218 mg/dL. He is worried that the metformin  is not sufficient.   Current diabetes medications include: metformin  500 mg BID Current hypertension medications include: metoprolol  succinate 25 mg daily (tremors) Current hyperlipidemia medications include: rosuvastatin  20 mg daily   Insurance coverage: [facility] Medicare/secondary Medicaid  Patient denies hypoglycemic events.  Reported average home fasting blood sugars: 218 mg/dL   Patient denies any personal history of pancreatitis or personal or family history of thyroid  cancer.   DM Prevention:  Statin: Not taking.?  ACE/ARB: no; History of chronic kidney disease? no Last urinary albumin/creatinine ratio:  Lab Results  Component Value Date   MICRALBCREAT 11 01/08/2023   Last eye exam:  Lab Results  Component Value Date   HMDIABEYEEXA No Retinopathy 06/16/2023   Lab Results  Component Value Date   HMDIABEYEEXA No Retinopathy 06/16/2023   Last foot exam: No foot exam  found Tobacco Use:  Tobacco Use: High Risk (12/07/2023)   Patient History    Smoking Tobacco Use: Every Day    Smokeless Tobacco Use: Former    Passive Exposure: Not on file   O:   Vitals:  Wt Readings from Last 3 Encounters:  12/07/23 205 lb 3.2 oz (93.1 kg)  08/23/23 217 lb 6.4 oz (98.6 kg)  08/11/23 213 lb (96.6 kg)   BP Readings from Last 3 Encounters:  12/08/23 101/68  12/07/23 112/65  08/23/23 118/60   Pulse Readings from Last 3 Encounters:  12/08/23 90  12/07/23 (!) 106  08/23/23 (!) 111     Labs:?  Lab Results  Component Value Date   HGBA1C 7.3 (A) 12/08/2023   HGBA1C 8.0 (H) 06/10/2023   HGBA1C 6.6 (H) 12/10/2022   GLUCOSE 197 (H) 12/06/2023   MICRALBCREAT 11 01/08/2023   CREATININE 0.75 12/06/2023   CREATININE 1.36 (H) 07/28/2023   CREATININE 0.90 07/20/2023    Lab Results  Component Value Date   CHOL 132 06/10/2023   LDLCALC 60 06/10/2023   LDLCALC 114 (H) 12/10/2022   HDL 34 (L) 06/10/2023   TRIG 232 (H) 06/10/2023   TRIG 124 12/10/2022   ALT 27 12/06/2023   ALT 23 07/28/2023   AST 20 12/06/2023   AST 25 07/28/2023      Chemistry      Component Value Date/Time   NA 136 12/06/2023 1458   NA 142 07/20/2023 0000   K 4.9 12/06/2023 1458   CL 96 (L) 12/06/2023 1458   CO2 29 12/06/2023 1458   BUN 11 12/06/2023 1458  BUN 9 07/20/2023 0000   CREATININE 0.75 12/06/2023 1458      Component Value Date/Time   CALCIUM  9.4 12/06/2023 1458   ALKPHOS 96 12/06/2023 1458   AST 20 12/06/2023 1458   ALT 27 12/06/2023 1458   BILITOT 0.5 12/06/2023 1458   BILITOT <0.2 06/10/2023 0908       The 10-year ASCVD risk score (Arnett DK, et al., 2019) is: 20.3%  Lab Results  Component Value Date   MICRALBCREAT 11 01/08/2023    A/P: Diabetes currently uncontrolled with a most recent A1c of 7.3% on 12/08/23. Fasting BG readings are elevated with an average of 218 mg/dL. Will initiate GLP1 therapy at this time.  -Started GLP-1 Ozempic (semaglutide) 0.25  mg weekly -Continued metformin  500 mg BID.  -Patient educated on purpose, proper use, and potential adverse effects of Ozempic  -Extensively discussed pathophysiology of diabetes, recommended lifestyle interventions, dietary effects on blood sugar control.  -Counseled on s/sx of and management of hypoglycemia.  -Next A1c anticipated 03/2024.   ASCVD risk - primary prevention in patient with diabetes. Last LDL is 60 mg/dL, at goal of <29 mg/dL.  high intensity statin indicated -Continued rosuvastatin  20 mg daily.   MISC: Counseled patient to get rescheduled with psychiatrist.  Written patient instructions provided. Patient verbalized understanding of treatment plan.  Total time in face to face counseling 40 minutes.     Follow-up:  Pharmacist on 02/09/24  Peyton CHARLENA Ferries, PharmD, CPP Clinical Pharmacist [facility] [phone]

## 2024-01-13 ENCOUNTER — Other Ambulatory Visit (HOSPITAL_COMMUNITY): Payer: Self-pay

## 2024-01-13 ENCOUNTER — Encounter: Payer: Self-pay | Admitting: Pharmacy Technician

## 2024-01-13 ENCOUNTER — Telehealth: Payer: Self-pay | Admitting: Pharmacy Technician

## 2024-01-13 NOTE — Telephone Encounter (Signed)
 Pharmacy Patient Advocate Encounter   Received notification from Onbase that prior authorization for Ozempic (0.25 or 0.5 MG/DOSE) 2MG /3ML pen-injectors is required/requested.   Insurance verification completed.   The patient is insured through [employer].   Per test claim: PA required; PA submitted to above mentioned insurance via Latent Key/confirmation #/EOC [id number] Status is pending

## 2024-01-13 NOTE — Telephone Encounter (Signed)
 error

## 2024-01-13 NOTE — Telephone Encounter (Signed)
 Pharmacy Patient Advocate Encounter  Received notification from [facility] that Prior Authorization for Ozempic (0.25 or 0.5 MG/DOSE) 2MG /3ML pen-injectors has been APPROVED from 01/13/2024 to 02/01/2025.   PA #/Case ID/Reference #: [id number]

## 2024-02-08 ENCOUNTER — Encounter: Payer: Self-pay | Admitting: Psychiatry

## 2024-02-08 ENCOUNTER — Ambulatory Visit (INDEPENDENT_AMBULATORY_CARE_PROVIDER_SITE_OTHER): Admitting: Psychiatry

## 2024-02-08 ENCOUNTER — Other Ambulatory Visit: Payer: Self-pay

## 2024-02-08 VITALS — BP 130/77 | HR 93 | Temp 97.7°F | Ht 70.0 in | Wt 224.6 lb

## 2024-02-08 DIAGNOSIS — F4010 Social phobia, unspecified: Secondary | ICD-10-CM | POA: Diagnosis not present

## 2024-02-08 DIAGNOSIS — F1021 Alcohol dependence, in remission: Secondary | ICD-10-CM

## 2024-02-08 DIAGNOSIS — G4701 Insomnia due to medical condition: Secondary | ICD-10-CM

## 2024-02-08 DIAGNOSIS — F4310 Post-traumatic stress disorder, unspecified: Secondary | ICD-10-CM

## 2024-02-08 DIAGNOSIS — F3178 Bipolar disorder, in full remission, most recent episode mixed: Secondary | ICD-10-CM | POA: Diagnosis not present

## 2024-02-08 DIAGNOSIS — F3170 Bipolar disorder, currently in remission, most recent episode unspecified: Secondary | ICD-10-CM | POA: Insufficient documentation

## 2024-02-08 DIAGNOSIS — F1221 Cannabis dependence, in remission: Secondary | ICD-10-CM | POA: Diagnosis not present

## 2024-02-08 MED ORDER — ESZOPICLONE 1 MG PO TABS: 1.0000 mg | ORAL_TABLET | Freq: Every evening | ORAL | 0 refills | Status: AC | PRN
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
## 2024-02-08 NOTE — Patient Instructions (Signed)
 Eszopiclone Tablets What is this medication? ESZOPICLONE (es ZOE pi clone) treats insomnia. It helps you go to sleep faster and stay asleep through the night. It is often used for a short period of time. This medicine may be used for other purposes; ask your health care provider or pharmacist if you have questions. COMMON BRAND NAME(S): Lunesta What should I tell my care team before I take this medication? They need to know if you have any of these conditions: Depression Liver disease Lung or breathing disease, such as asthma or COPD Substance use disorder Sleep-walking, driving, eating, or other activity while not fully awake after taking a sleep medication Suicidal thoughts, plans, or attempt by you or a family member An unusual or allergic reaction to eszopiclone, other medications, foods, dyes, or preservatives Pregnant or trying to get pregnant Breastfeeding How should I use this medication? Take this medication by mouth with a glass of water. Follow the directions on the prescription label. It is better to take this medication on an empty stomach and only when you are ready for bed. Do not take your medication more often than directed. If you have been taking this medication for several weeks and suddenly stop taking it, you may get unpleasant withdrawal symptoms. Your care team may want to gradually reduce the dose. Do not stop taking this medication on your own. Always follow your care team's advice. A special MedGuide will be given to you by the pharmacist with each prescription and refill. Be sure to read this information carefully each time. Talk to your care team about the use of this medication in children. Special care may be needed. Overdosage: If you think you have taken too much of this medicine contact a poison control center or emergency room at once. NOTE: This medicine is only for you. Do not share this medicine with others. What if I miss a dose? This does not apply. This  medication should only be taken immediately before going to sleep. Do not take double or extra doses. What may interact with this medication? Lorazepam Medications for fungal infections, such as ketoconazole, fluconazole, itraconazole Olanzapine Supplements, such as melatonin, St. John's wort, valerian This list may not describe all possible interactions. Give your health care provider a list of all the medicines, herbs, non-prescription drugs, or dietary supplements you use. Also tell them if you smoke, drink alcohol, or use illegal drugs. Some items may interact with your medicine. What should I watch for while using this medication? Visit your care team for regular checks on your progress. Keep a regular sleep schedule by going to bed at about the same time nightly. Avoid caffeine-containing drinks in the evening hours, as caffeine can cause trouble with falling asleep. Talk to your care team if you still have trouble sleeping. You may do unusual sleep behaviors or activities you do not remember the day after taking this medication. Activities include driving, making or eating food, talking on the phone, sexual activity, or sleep walking. Stop taking this medication and call your care team right away if you find out you have done activities like this. Plan to go to bed and stay in bed for a full night (7 to 8 hours) after you take this medication. You may still be drowsy the morning after taking this medication. This medication may affect your coordination, reaction time, or judgment. Do not drive or operate machinery until you know how this medication affects you. Sit up or stand slowly to reduce the risk  of dizzy or fainting spells. If you or your family notice any changes in your behavior, such as new or worsening depression, thoughts of harming yourself, anxiety, other unusual or disturbing thoughts, or memory loss, call your care team right away. After you stop taking this medication, you may  have trouble falling asleep. This is called rebound insomnia. This problem usually goes away on its own after 1 or 2 nights. What side effects may I notice from receiving this medication? Side effects that you should report to your care team as soon as possible: Allergic reactions or angioedema--skin rash, itching, hives, swelling of the face, eyes, lips, tongue, arms, or legs, trouble swallowing or breathing CNS depression--slow or shallow breathing, shortness of breath, feeling faint, dizziness, confusion, trouble staying awake Mood and behavior changes--anxiety, nervousness, confusion, hallucinations, irritability, hostility, thoughts of suicide or self-harm, worsening mood, feelings of depression Unusual sleep behaviors or activities you do not remember such as driving, eating, or sexual activity Side effects that usually do not require medical attention (report to your care team if they continue or are bothersome): Dizziness Drowsiness the day after use Dry mouth Headache Metallic taste in mouth This list may not describe all possible side effects. Call your doctor for medical advice about side effects. You may report side effects to FDA at 1-800-FDA-1088. Where should I keep my medication? Keep out of the reach of children and pets. This medication can be abused. Keep it in a safe place to protect it from theft. Do not share it with anyone. It is only for you. Selling or giving away this medication is dangerous and against the law. Store at room temperature between 20 and 25 degrees C (68 and 77 degrees F). Get rid of any unused medication after the expiration date. This medication may cause harm and death if taken by other adults, children, or pets. It is important to get rid of this medication as soon as you no longer need it or it has expired. You can do this in two ways: Take the medication to a medication take-back program. Check with your pharmacy or law enforcement to find a  location. If you cannot return the medication, check the label or package insert to see if the medication should be thrown out in the garbage or flushed down the toilet. If you are not sure, ask your care team. If it is safe to put it in the trash, take the medication out of the container. Mix the medication with cat litter, dirt, coffee grounds, or other unwanted substance. Seal the mixture in a bag or container. Put it in the trash. NOTE: This sheet is a summary. It may not cover all possible information. If you have questions about this medicine, talk to your doctor, pharmacist, or health care provider.  2024 [employer]/Gold Standard (2022-07-23 00:00:00)

## 2024-02-09 ENCOUNTER — Telehealth: Payer: Self-pay

## 2024-02-09 ENCOUNTER — Ambulatory Visit

## 2024-02-09 ENCOUNTER — Encounter: Payer: Self-pay | Admitting: Psychiatry

## 2024-02-09 NOTE — Telephone Encounter (Signed)
 Contacted patient regarding pharmacy appointment scheduled for today. It appears he has established care with [facility] in [place]. Patient reports he does not plan to return to see Janna Ostwalt. Explained we will have to cancel pharmacy appointment as his care is now outside of [facility]. Let him know that we can reschedule if he moves his care back to this office. Patient vocalized understanding.  Harmon Bommarito E. Marsh, PharmD, CPP Clinical Pharmacist [facility] [phone]

## 2024-02-25 ENCOUNTER — Other Ambulatory Visit: Payer: Self-pay | Admitting: Physician Assistant

## 2024-02-28 ENCOUNTER — Other Ambulatory Visit: Payer: Self-pay | Admitting: Physician Assistant

## 2024-02-28 DIAGNOSIS — K219 Gastro-esophageal reflux disease without esophagitis: Secondary | ICD-10-CM

## 2024-03-02 ENCOUNTER — Ambulatory Visit: Admitting: Psychiatry

## 2024-03-16 ENCOUNTER — Ambulatory Visit: Admitting: Psychiatry
